# Patient Record
Sex: Male | Born: 1960 | Race: White | Hispanic: Yes | Marital: Married | Smoking: Former smoker
Health system: Southern US, Community
[De-identification: ages and names within clinical notes are randomized; demographics above are authoritative.]

## PROBLEM LIST (undated history)

## (undated) DIAGNOSIS — I2510 Atherosclerotic heart disease of native coronary artery without angina pectoris: Secondary | ICD-10-CM

## (undated) DIAGNOSIS — E119 Type 2 diabetes mellitus without complications: Secondary | ICD-10-CM

## (undated) DIAGNOSIS — E785 Hyperlipidemia, unspecified: Secondary | ICD-10-CM

## (undated) DIAGNOSIS — I10 Essential (primary) hypertension: Secondary | ICD-10-CM

## (undated) HISTORY — DX: Atherosclerotic heart disease of native coronary artery without angina pectoris: I25.10

---

## 2005-10-18 ENCOUNTER — Emergency Department (HOSPITAL_COMMUNITY): Admission: EM | Admit: 2005-10-18 | Discharge: 2005-10-18 | Payer: Self-pay | Admitting: Family Medicine

## 2007-07-16 ENCOUNTER — Ambulatory Visit: Payer: Self-pay | Admitting: Nurse Practitioner

## 2007-07-17 ENCOUNTER — Ambulatory Visit: Payer: Self-pay | Admitting: *Deleted

## 2008-04-10 ENCOUNTER — Ambulatory Visit: Payer: Self-pay | Admitting: Internal Medicine

## 2008-04-23 ENCOUNTER — Ambulatory Visit: Payer: Self-pay | Admitting: Internal Medicine

## 2008-05-22 ENCOUNTER — Ambulatory Visit: Payer: Self-pay | Admitting: Internal Medicine

## 2008-06-09 ENCOUNTER — Encounter (INDEPENDENT_AMBULATORY_CARE_PROVIDER_SITE_OTHER): Payer: Self-pay | Admitting: Adult Health

## 2008-06-09 ENCOUNTER — Ambulatory Visit: Payer: Self-pay | Admitting: Internal Medicine

## 2008-06-09 LAB — CONVERTED CEMR LAB
BUN: 16 mg/dL (ref 6–23)
Chloride: 104 meq/L (ref 96–112)
LDL Cholesterol: 118 mg/dL — ABNORMAL HIGH (ref 0–99)
Total CHOL/HDL Ratio: 6.6
Triglycerides: 215 mg/dL — ABNORMAL HIGH (ref <150)

## 2008-07-05 ENCOUNTER — Inpatient Hospital Stay (HOSPITAL_COMMUNITY): Admission: EM | Admit: 2008-07-05 | Discharge: 2008-07-09 | Payer: Self-pay | Admitting: Emergency Medicine

## 2008-08-07 ENCOUNTER — Ambulatory Visit: Payer: Self-pay | Admitting: Internal Medicine

## 2009-03-08 ENCOUNTER — Encounter: Admission: RE | Admit: 2009-03-08 | Discharge: 2009-03-08 | Payer: Self-pay | Admitting: Specialist

## 2009-11-01 ENCOUNTER — Emergency Department (HOSPITAL_COMMUNITY): Admission: EM | Admit: 2009-11-01 | Discharge: 2009-11-02 | Payer: Self-pay | Admitting: Emergency Medicine

## 2010-06-17 LAB — URINE CULTURE
Colony Count: NO GROWTH
Culture  Setup Time: 201108020438
Culture: NO GROWTH

## 2010-06-17 LAB — DIFFERENTIAL
Basophils Absolute: 0 10*3/uL (ref 0.0–0.1)
Basophils Relative: 1 % (ref 0–1)
Lymphocytes Relative: 20 % (ref 12–46)
Lymphs Abs: 0.7 10*3/uL (ref 0.7–4.0)
Monocytes Absolute: 0.3 10*3/uL (ref 0.1–1.0)
Monocytes Relative: 8 % (ref 3–12)

## 2010-06-17 LAB — CULTURE, BLOOD (ROUTINE X 2): Culture: NO GROWTH

## 2010-06-17 LAB — BASIC METABOLIC PANEL
BUN: 7 mg/dL (ref 6–23)
CO2: 25 mEq/L (ref 19–32)
Calcium: 7.9 mg/dL — ABNORMAL LOW (ref 8.4–10.5)
Glucose, Bld: 146 mg/dL — ABNORMAL HIGH (ref 70–99)
Sodium: 135 mEq/L (ref 135–145)

## 2010-06-17 LAB — URINALYSIS, ROUTINE W REFLEX MICROSCOPIC
Glucose, UA: NEGATIVE mg/dL
Ketones, ur: NEGATIVE mg/dL
Nitrite: NEGATIVE
Protein, ur: NEGATIVE mg/dL
Specific Gravity, Urine: 1.004 — ABNORMAL LOW (ref 1.005–1.030)

## 2010-06-17 LAB — CBC
Hemoglobin: 13.9 g/dL (ref 13.0–17.0)
MCH: 32 pg (ref 26.0–34.0)
MCV: 89 fL (ref 78.0–100.0)
WBC: 3.6 10*3/uL — ABNORMAL LOW (ref 4.0–10.5)

## 2010-07-13 LAB — CBC
HCT: 39.6 % (ref 39.0–52.0)
HCT: 43.2 % (ref 39.0–52.0)
Hemoglobin: 13.1 g/dL (ref 13.0–17.0)
Hemoglobin: 14.1 g/dL (ref 13.0–17.0)
Hemoglobin: 15.1 g/dL (ref 13.0–17.0)
MCHC: 34.8 g/dL (ref 30.0–36.0)
MCHC: 35.6 g/dL (ref 30.0–36.0)
MCV: 94.6 fL (ref 78.0–100.0)
MCV: 95 fL (ref 78.0–100.0)
Platelets: 122 10*3/uL — ABNORMAL LOW (ref 150–400)
Platelets: 99 10*3/uL — ABNORMAL LOW (ref 150–400)
RBC: 4.59 MIL/uL (ref 4.22–5.81)
RDW: 12.8 % (ref 11.5–15.5)
RDW: 12.9 % (ref 11.5–15.5)
RDW: 12.9 % (ref 11.5–15.5)
RDW: 12.9 % (ref 11.5–15.5)
WBC: 9.1 10*3/uL (ref 4.0–10.5)

## 2010-07-13 LAB — HEMOGLOBIN A1C
Hgb A1c MFr Bld: 6.4 % — ABNORMAL HIGH (ref 4.6–6.1)
Hgb A1c MFr Bld: 6.5 % — ABNORMAL HIGH (ref 4.6–6.1)
Mean Plasma Glucose: 140 mg/dL

## 2010-07-13 LAB — MAGNESIUM
Magnesium: 2 mg/dL (ref 1.5–2.5)
Magnesium: 2.2 mg/dL (ref 1.5–2.5)
Magnesium: 2.3 mg/dL (ref 1.5–2.5)

## 2010-07-13 LAB — PHOSPHORUS
Phosphorus: 1.1 mg/dL — ABNORMAL LOW (ref 2.3–4.6)
Phosphorus: 2.4 mg/dL (ref 2.3–4.6)
Phosphorus: 3 mg/dL (ref 2.3–4.6)

## 2010-07-13 LAB — BASIC METABOLIC PANEL
BUN: 7 mg/dL (ref 6–23)
CO2: 21 mEq/L (ref 19–32)
CO2: 23 mEq/L (ref 19–32)
Calcium: 8.5 mg/dL (ref 8.4–10.5)
Calcium: 8.7 mg/dL (ref 8.4–10.5)
Calcium: 8.8 mg/dL (ref 8.4–10.5)
Chloride: 101 mEq/L (ref 96–112)
Creatinine, Ser: 0.99 mg/dL (ref 0.4–1.5)
Creatinine, Ser: 1.11 mg/dL (ref 0.4–1.5)
GFR calc Af Amer: >60 mL/min (ref >60)
GFR calc non Af Amer: >60 mL/min (ref >60)
GFR calc non Af Amer: >60 mL/min (ref >60)
Glucose, Bld: 119 mg/dL — ABNORMAL HIGH (ref 70–99)
Glucose, Bld: 126 mg/dL — ABNORMAL HIGH (ref 70–99)
Potassium: 3.2 mEq/L — ABNORMAL LOW (ref 3.5–5.1)
Sodium: 135 mEq/L (ref 135–145)
Sodium: 136 mEq/L (ref 135–145)

## 2010-07-13 LAB — DIFFERENTIAL
Basophils Absolute: 0 10*3/uL (ref 0.0–0.1)
Lymphs Abs: 1 10*3/uL (ref 0.7–4.0)
Neutrophils Relative %: 81 % — ABNORMAL HIGH (ref 43–77)

## 2010-07-13 LAB — URINE CULTURE

## 2010-07-13 LAB — GLUCOSE, CAPILLARY
Glucose-Capillary: 109 mg/dL — ABNORMAL HIGH (ref 70–99)
Glucose-Capillary: 110 mg/dL — ABNORMAL HIGH (ref 70–99)
Glucose-Capillary: 123 mg/dL — ABNORMAL HIGH (ref 70–99)
Glucose-Capillary: 125 mg/dL — ABNORMAL HIGH (ref 70–99)
Glucose-Capillary: 131 mg/dL — ABNORMAL HIGH (ref 70–99)
Glucose-Capillary: 132 mg/dL — ABNORMAL HIGH (ref 70–99)
Glucose-Capillary: 152 mg/dL — ABNORMAL HIGH (ref 70–99)

## 2010-07-13 LAB — URINALYSIS, ROUTINE W REFLEX MICROSCOPIC
Bilirubin Urine: NEGATIVE
Ketones, ur: NEGATIVE mg/dL
Nitrite: NEGATIVE
Protein, ur: NEGATIVE mg/dL

## 2010-07-13 LAB — POCT CARDIAC MARKERS: Myoglobin, poc: 58.3 ng/mL (ref 12–200)

## 2010-07-13 LAB — CK TOTAL AND CKMB (NOT AT ARMC)
CK, MB: 0.2 ng/mL — ABNORMAL LOW (ref 0.3–4.0)
Relative Index: INVALID (ref 0.0–2.5)

## 2010-07-13 LAB — COMPREHENSIVE METABOLIC PANEL
Albumin: 4 g/dL (ref 3.5–5.2)
BUN: 7 mg/dL (ref 6–23)
CO2: 27 mEq/L (ref 19–32)
Calcium: 9.1 mg/dL (ref 8.4–10.5)
Creatinine, Ser: 1.14 mg/dL (ref 0.4–1.5)
GFR calc non Af Amer: >60 mL/min (ref >60)
Glucose, Bld: 168 mg/dL — ABNORMAL HIGH (ref 70–99)
Potassium: 2.5 mEq/L — CL (ref 3.5–5.1)
Total Protein: 7.4 g/dL (ref 6.0–8.3)

## 2010-07-13 LAB — CULTURE, BLOOD (ROUTINE X 2)
Culture: NO GROWTH
Culture: NO GROWTH

## 2010-07-13 LAB — TROPONIN I: Troponin I: <0.01 ng/mL (ref 0.00–0.06)

## 2010-08-16 NOTE — Discharge Summary (Signed)
NAMEKYLEE, [name]              ACCOUNT NO.:  [id number]   MEDICAL RECORD NO.:  [id number]          PATIENT TYPE:  INP   LOCATION:  5511                         FACILITY:  [facility]   PHYSICIAN:  Altha Harm, MDDATE OF BIRTH:  January 21, 1961   DATE OF ADMISSION:  07/05/2008  DATE OF DISCHARGE:  07/09/2008                               DISCHARGE SUMMARY   DISCHARGE DISPOSITION:  Home.   FINAL DISCHARGE DIAGNOSES:  1. Viral gastroenteritis.  2. Dehydration, resolved.  3. Nausea and vomiting.  4. Hypokalemia.  5. Chest pain, resolved.  6. Subacute sinusitis.  7. Hyperglycemia.  8. Tachycardia, resolved.  9. Local reaction to pneumonia vaccine.   DISCHARGE MEDICATIONS:  Avelox 400 mg p.o. daily times three days.   MEDICATION BEING HELD AT THIS TIME:  Hydrochlorothiazide 25 mg p.o.  daily.   ALLERGIES:  NO KNOWN DRUG ALLERGIES.   CODE STATUS:  Full code.   CHIEF COMPLAINT:  Nausea, vomiting, and fever.   HISTORY OF PRESENT ILLNESS:  Please refer to the H and P by Maude Leriche, MD for details of the HPI.   HOSPITAL COURSE:  1. The patient was admitted with nausea, vomiting and fever.  There      was some concern that this might have been a reaction to food he      had eaten.  However, it appeared to be more consistent with a viral      gastroenteritis.  The patient was also significantly dehydrated at      the time of admission and his hydrochlorothiazide was held.  The      patient was given supportive care and IV fluid resuscitation.  He      resolved his nausea and vomiting.  He was started on a diet, which      he advanced to a 2-gram sodium diet and tolerated it without any      difficulty.  The patient was still clinically dehydrated with      tachycardia resulting from his dehydration.  The patient was given      aggressive fluid hydration and resolved his tachycardia.      Currently, his hydrochlorothiazide is being held secondary to a      normotensive state  without any antihypertensives and secondary to      the fact that he was dehydrated upon admission.  The patient has      been instructed to monitor his blood pressure and to resume his      hydrochlorothiazide if his blood pressure gets above 140/90.      Currently, blood pressures are ranging within the 122/79 range.  2. Tachycardia.  It was felt that the tachycardia was secondary to      volume depletion.  This resolved after the patient was fluid      resuscitated.  3. Local reaction to pneumonia vaccination.  The patient had a      pneumonia vaccination and developed a local reaction with erythema      and redness to the skin.  This did not extend beyond the area of  the vaccination and it was felt that this was local and not a      systemic infection.  4. Subacute sinusitis.  The patient complained of a headache,      particularly over the sinuses.  The patient had already been      started on Avelox and a CT scan of the sinuses did not show any      evidence of acute sinusitis.  However, in light of the fact that      the patient had partial treatment, the patient will complete Avelox      for a total of 3 days for a total of 7 days of treatment.   Otherwise, the patient remained stable in the hospital.  He is now  ambulatory without any need for oxygen.  The patient has no complaints  of headache, difficulty with vision and his fevers have resolved.   RADIOLOGIC STUDIES:  Done here in the hospital include the following:  1. A 2-view chest x-ray which showed bronchial thickening and no      consolidation or collapse.  2. A  2-view of the abdomen which showed unremarkable abdominal      radiographs.  3. CT of the head the head without contrast which showed normal head      CT.  4. Maxillofacial CT of the sinuses which showed mucosal inflammation      of the right maxillary sinus.  The other sinuses clear.   DIETARY RESTRICTIONS:  The patient is on a 2-gram sodium diet.    PHYSICAL RESTRICTIONS:  None.   FOLLOW UP:  The patient is to follow up with [facility], his primary  care physicians within 1 week.   OTHER INSTRUCTIONS:  The patient is to monitor his blood pressure and  reinstitute taking his hydrochlorothiazide if his blood pressure gets  over 140/90.      Altha Harm, MD  Electronically Signed     MAM/MEDQ  D:  07/09/2008  T:  07/09/2008  Job:  [phone]

## 2010-08-16 NOTE — H&P (Signed)
NAMEOSHER, OETTINGER              ACCOUNT NO.:  [id number]   MEDICAL RECORD NO.:  [id number]          PATIENT TYPE:  INP   LOCATION:  5511                         FACILITY:  [facility]   PHYSICIAN:  Lamar Laundry, MD      DATE OF BIRTH:  08-20-1960   DATE OF ADMISSION:  07/05/2008  DATE OF DISCHARGE:                              HISTORY & PHYSICAL   CHIEF COMPLAINT:  Nausea, vomiting and fever.   HISTORY OF PRESENT ILLNESS:  This is a 50 year old Hispanic gentleman  with a previous history of hypertension who presents to the emergency  department after experiencing nausea, vomiting, and a fever earlier  today.  He denies any sick contacts.  He states that he had pollo asada,  a grilled chicken dish last night and he started to feel queasy after  eating this.  His fever did not occur until today.  He states that he  vomited twice today and this vomitus was nonbloody and nonbilious and  simply resembled the food that he had eaten the night before.  He states  that since he vomited earlier today he has had a mild pain in his  central chest and has felt mildly short of breath.  He denies any cough.  He states that the abdominal discomfort he had last night has actually  improved.  He denies that he ever has any exertional chest pain or  shortness of breath.  He furthermore denies other complaints such as  dysuria, diarrhea, melena, hematochezia or new skin findings.  In the  ER, his labs returned revealing a potassium of 2.5.  He was started on  potassium replacement with 50 mEq being provided between oral and IV  methods and 650 mg of Tylenol was administered for his fever.   ALLERGIES:  No known drug allergies.   MEDICATIONS:  Hydrochlorothiazide 25 mg p.o. daily.   PAST MEDICAL HISTORY:  Significant only for hypertension.   SOCIAL HISTORY:  He lives in [place].  He is married.  He has several  children.  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  He has a 15-pack year  smoking  history and quit 10 years ago.  He uses alcohol occasionally.  He denies the use of any illicit drugs.   FAMILY HISTORY:  His mother had coronary artery disease and is status  post 3-vessel CABG done at some point in her 60s.   REVIEW OF SYSTEMS:  A 10-point review of systems was performed and  significant only for the nausea and vomiting, fever and mild chest  discomfort since his episode of emesis as previously described in the  HPI.   PHYSICAL EXAMINATION:  VITAL SIGNS: Temperature 101.9, blood pressure  137/89, pulse 103 which has decreased down to 90, respirations 20, and  97% on room air.  GENERAL:  This is a well-appearing Latin American gentleman in no acute  distress awake, alert, oriented x3.  HEENT EXAM:  Normocephalic, atraumatic.  Extraocular movements intact.  Dry mucous membranes.  NECK: No jugular venous distention.  CARDIOVASCULAR:  Regular rate and rhythm.  S1, S2; no murmurs.  LUNGS: Clear  to auscultation bilaterally.  No crackles, wheezes or  rhonchi.  ABDOMEN: Soft.  There is some very minimal tenderness in the left upper  quadrant.  Normoactive bowel sounds.  EXTREMITIES: No pretibial edema.  SKIN: No rashes.   LABORATORY DATA:  WBC count 9.6 with 81% neutrophils, hemoglobin 15.1,  hematocrit 43.2, platelets 130.  Sodium 134, potassium 2.5, chloride 96,  bicarb 27, BUN 7, creatinine 1.14, glucose 168.  LFTs within normal  limits.  Urinalysis is negative.  Albumin 4, calcium 9.1.  Group A strep  was negative.  Chest x-ray revealed some bronchial thickening but no  evidence of consolidation.  An EKG revealed normal sinus rhythm without  any acute changes.   ASSESSMENT AND PLAN:  This is a 50 year old gentleman with nausea,  vomiting and fever, likely from gastroenteritis or even possible food  poisoning, and subsequent hypokalemia.  1. Hypokalemia.  The patient has already received repletion of his      potassium via IV and p.o. strategies.  We will recheck  his      potassium and administer potassium in his IV fluids with normal      saline with 40 mEq of KCl at 100 ml/hr.  Additionally we will check      his magnesium and replete if needed.  His potassium may need to be      more aggressively repleted if it is still significantly low at the      subsequent check.  2. Nausea, vomiting and fever.  As previously stated, it is likely      this patient may have a gastroenteritis or may have a possible food      poisoning.  However, due to the mild tenderness in the abdomen, we      will check a abdominal x-ray to rule out any acute findings.  We      will place this patient on a PPI.  We will place him on a clear      liquid, diet provide IV fluids and provide p.r.n. antiemetics as      needed.  3. Chest pain.  This patient has some mild chest discomfort in his      central chest that has occurred since his episodes of emesis.  He      does not have any exertional component to this.  However, as he is      a 50 year old gentleman with hypertension and a prior smoking      history with the family history of coronary disease, we will check      one set of cardiac enzymes.  If these are negative, as his chest      pain has been around for greater than 6 hours and considering his      story is unlikely for a cardiac event, it would be most likely that      his chest pain was secondary to his emesis and other GI issues.  We      will place the patient on a proton pump inhibitor.  4. Elevated blood sugar.  His blood sugar returned at 168. This may      have been secondary to some acute stress.  However, as this patient      does not regularly see PCP, we will place the patient on every 6-      hour Accu-Cheks and check hemoglobin A1c to see if he may have a      diagnosis of diabetes.  5. Prophylaxis.  We will place this patient on Lovenox every 24 hours.      This patient has been placed on a PPI as well due to his chest      discomfort and  abdominal discomfort.  6. Code status.  The patient wishes to be a full code.      Lamar Laundry, MD  Electronically Signed     HR/MEDQ  D:  07/05/2008  T:  07/06/2008  Job:  [id number]

## 2014-11-01 ENCOUNTER — Emergency Department (HOSPITAL_COMMUNITY)
Admission: EM | Admit: 2014-11-01 | Discharge: 2014-11-01 | Disposition: A | Discharge status: Home / Self Care | Payer: No Typology Code available for payment source | Attending: Emergency Medicine | Admitting: Emergency Medicine

## 2014-11-01 ENCOUNTER — Emergency Department (HOSPITAL_COMMUNITY): Payer: No Typology Code available for payment source

## 2014-11-01 ENCOUNTER — Encounter (HOSPITAL_COMMUNITY): Payer: Self-pay | Admitting: Emergency Medicine

## 2014-11-01 DIAGNOSIS — B349 Viral infection, unspecified: Secondary | ICD-10-CM | POA: Diagnosis not present

## 2014-11-01 DIAGNOSIS — J029 Acute pharyngitis, unspecified: Secondary | ICD-10-CM | POA: Diagnosis present

## 2014-11-01 DIAGNOSIS — E119 Type 2 diabetes mellitus without complications: Secondary | ICD-10-CM | POA: Insufficient documentation

## 2014-11-01 DIAGNOSIS — I10 Essential (primary) hypertension: Secondary | ICD-10-CM | POA: Insufficient documentation

## 2014-11-01 HISTORY — DX: Type 2 diabetes mellitus without complications: E11.9

## 2014-11-01 HISTORY — DX: Essential (primary) hypertension: I10

## 2014-11-01 LAB — RAPID STREP SCREEN (MED CTR MEBANE ONLY): STREPTOCOCCUS, GROUP A SCREEN (DIRECT): NEGATIVE

## 2014-11-01 MED ORDER — LIDOCAINE VISCOUS 2 % MT SOLN: 20.0000 mL | OROMUCOSAL | Status: DC | PRN

## 2014-11-01 MED ORDER — ACETAMINOPHEN 325 MG PO TABS
650.0000 mg | ORAL_TABLET | Freq: Once | ORAL | Status: AC
  Administered 2014-11-01: 650 mg via ORAL
  Filled 2014-11-01: qty 2

## 2014-11-01 MED ORDER — DEXAMETHASONE 1 MG/ML PO CONC
10.0000 mg | Freq: Once | ORAL | Status: AC
  Administered 2014-11-01: 10 mg via ORAL
  Filled 2014-11-01: qty 10

## 2014-11-01 MED ORDER — IBUPROFEN 600 MG PO TABS: 600.0000 mg | ORAL_TABLET | Freq: Four times a day (QID) | ORAL | Status: DC | PRN

## 2014-11-01 MED ORDER — HYDROCODONE-HOMATROPINE 5-1.5 MG/5ML PO SYRP
5.0000 mL | ORAL_SOLUTION | Freq: Once | ORAL | Status: AC
  Administered 2014-11-01: 5 mL via ORAL
  Filled 2014-11-01: qty 5

## 2014-11-01 MED ORDER — ACETAMINOPHEN 500 MG PO TABS: 500.0000 mg | ORAL_TABLET | Freq: Four times a day (QID) | ORAL | Status: DC | PRN

## 2014-11-01 NOTE — Discharge Instructions (Signed)
Please follow up with your primary care physician in 1-2 days. If you do not have one please call the [facility] and wellness Center number listed above.Your strep screen was negative this evening. A throat culture was sent as a precaution and results will be available in 2-3 days. If it returns positive for strep, you will be called by our flow manager for further instructions. However, at this time, it appears that your sore throat is caused by a viral infection. Antibiotics do NOT help a viral infection and can cause unwanted side effects. The fever should resolve in 2-3 days and sore throat should begin to resolve in 2-3 days as well. May take ibuprofen every 6hr as needed for throat pain and fever. Follow up with your doctor in 2-3 days. Return sooner for worsening symptoms, inability to swallow, breathing difficulty, new concerns. Please read all discharge instructions and return precautions.   Infecciones virales  (Viral Infections)  Un virus es un tipo de germen. Puede causar:   Dolor de garganta leve.  Dolores musculares.  Dolor de [place].  Secrecin nasal.  Erupciones.  Lagrimeo.  Cansancio.  Tos.  Prdida del apetito.  Ganas de vomitar (nuseas).  Vmitos.  Materia fecal lquida (diarrea). CUIDADOS EN EL HOGAR   Tome la medicacin slo como le haya indicado el mdico.  Beba gran cantidad de lquido para mantener la orina de tono claro o color amarillo plido. Las bebidas deportivas son [employer] eleccin.  Descanse lo suficiente y [employer]. Puede tomar sopas y caldos con crackers o arroz. SOLICITE AYUDA DE INMEDIATO SI:   Siente un dolor de cabeza muy intenso.  Le falta el aire.  Tiene dolor en el pecho o en el cuello.  Tiene una erupcin que no tena antes.  No puede detener los vmitos.  Tiene una hemorragia que no se detiene.  No puede retener los lquidos.  Usted o el nio tienen una temperatura oral le sube a ms de 38,9 C (102 F), y no  puede bajarla con medicamentos.  Su beb tiene ms de 3 meses y su temperatura rectal es de 102 F (38.9 C) o ms.  Su beb tiene 3 meses o menos y su temperatura rectal es de 100.4 F (38 C) o ms. ASEGRESE DE QUE:   Comprende estas instrucciones.  Controlar la enfermedad.  Solicitar ayuda de inmediato si no mejora o si empeora. Document Released: 08/22/2010 Document Revised: 06/12/2011 [facility] Patient Information 2015 [place], [place]. This information is not intended to replace advice given to you by your health care provider. Make sure you discuss any questions you have with your health care provider.

## 2014-11-01 NOTE — ED Notes (Signed)
Water given to pt per Will, PA

## 2014-11-01 NOTE — ED Provider Notes (Signed)
CSN: [id number]     Arrival date & time 11/01/14  2103 History   First MD Initiated Contact with Patient 11/01/14 2144     Chief Complaint  Patient presents with  . Sore Throat  . Fever     (Consider location/radiation/quality/duration/timing/severity/associated sxs/prior Treatment) HPI Comments: Pt. reports sore throat with swelling , fever , occasional dry cough and chills onset 3 days ago .   Patient is a 54 y.o. male presenting with pharyngitis. The history is provided by the patient. The history is limited by a language barrier.  Sore Throat This is a new problem. The current episode started in the past 7 days. The problem occurs constantly. The problem has been gradually worsening. Associated symptoms include congestion, coughing, a fever and a sore throat. Pertinent negatives include no rash. The symptoms are aggravated by swallowing. He has tried NSAIDs for the symptoms. The treatment provided mild relief.    Past Medical History  Diagnosis Date  . Diabetes mellitus without complication   . Hypertension    History reviewed. No pertinent past surgical history. No family history on file. History  Substance Use Topics  . Smoking status: Never Smoker   . Smokeless tobacco: Not on file  . Alcohol Use: Yes    Review of Systems  Constitutional: Positive for fever.  HENT: Positive for congestion and sore throat.   Respiratory: Positive for cough.   Skin: Negative for rash.  All other systems reviewed and are negative.     Allergies  Review of patient's allergies indicates no known allergies.  Home Medications   Prior to Admission medications   Medication Sig Start Date End Date Taking? Authorizing Provider  acetaminophen (TYLENOL) 500 MG tablet Take 1 tablet (500 mg total) by mouth every 6 (six) hours as needed. 11/01/14   Tanielle Emigh, PA-C  ibuprofen (ADVIL,MOTRIN) 600 MG tablet Take 1 tablet (600 mg total) by mouth every 6 (six) hours as needed. 11/01/14    Brinlynn Gorton, PA-C  lidocaine (XYLOCAINE) 2 % solution Use as directed 20 mLs in the mouth or throat as needed for mouth pain. 11/01/14   Zera Markwardt, PA-C   BP 97/54 mmHg  Pulse 78  Temp(Src) 98.6 F (37 C) (Oral)  Resp 16  SpO2 98% Physical Exam  Constitutional: He is oriented to person, place, and time. He appears well-developed and well-nourished. No distress.  HENT:  Head: Normocephalic and atraumatic.  Right Ear: External ear normal.  Left Ear: External ear normal.  Nose: Nose normal.  Mouth/Throat: Oropharynx is clear and moist. No oropharyngeal exudate.  Eyes: Conjunctivae are normal.  Neck: Normal range of motion. Neck supple.  No nuchal rigidity. Erythematous oropharynx without exudate. No uvular deviation or trismus.  Cardiovascular: Normal rate, regular rhythm and normal heart sounds.   Pulmonary/Chest: Effort normal and breath sounds normal. No respiratory distress.  Abdominal: Soft.  Musculoskeletal: Normal range of motion.  Lymphadenopathy:    He has cervical adenopathy.  Neurological: He is alert and oriented to person, place, and time.  Skin: Skin is warm and dry. He is not diaphoretic.  Psychiatric: He has a normal mood and affect.  Nursing note and vitals reviewed.   ED Course  Procedures (including critical care time) Medications  acetaminophen (TYLENOL) tablet 650 mg (650 mg Oral Given 11/01/14 2155)  dexamethasone (DECADRON) 1 MG/ML solution 10 mg (10 mg Oral Given 11/01/14 2224)  HYDROcodone-homatropine (HYCODAN) 5-1.5 MG/5ML syrup 5 mL (5 mLs Oral Given 11/01/14 2224)    Labs  Review Labs Reviewed  RAPID STREP SCREEN (NOT AT [facility])  CULTURE, GROUP A STREP    Imaging Review Dg Chest 2 View  11/01/2014   CLINICAL DATA:  Fever and cough for 3 days  EXAM: CHEST  2 VIEW  COMPARISON:  11/02/2009  FINDINGS: The heart size and mediastinal contours are within normal limits. Both lungs are clear. The visualized skeletal structures are  unremarkable.  IMPRESSION: No active cardiopulmonary disease.   Electronically Signed   By: Ellery Plunk M.D.   On: 11/01/2014 23:16     EKG Interpretation None      MDM   Final diagnoses:  Viral illness    Filed Vitals:   11/01/14 2327  BP: 97/54  Pulse: 78  Temp: 98.6 F (37 C)  Resp: 16   Patient presenting with fever to ED. Pt alert, active, and oriented per age. PE showederythematous oropharynx without medial deviation or trismus. Mild cervical adenopathy appreciated. Lungs clear to auscultation bilaterally. Abdomen is soft, nontender, nondistended. nuchal rigidity or toxicity to suggest meningitis. Pt tolerating PO liquids in ED without difficulty.  Tylenold improvement of fetachycardia resolved with fever reduction. Rapid strep negative for bacterial source. Chest x-ray without evidence of pneumonia. Likely viral infection. Symptomatically measures discussed.Advised pediatrician follow up in 1-2 days. Return precautions discussed. Patient agreeable to plan. Stable at time of discharge.     Francee Piccolo, PA-C 11/02/14 0044  Blake Divine, MD 11/03/14 [phone]

## 2014-11-01 NOTE — ED Notes (Signed)
Pt. reports sore throat with swelling , fever , occasional dry cough and chills onset 3 days ago .

## 2014-11-05 LAB — CULTURE, GROUP A STREP

## 2017-07-21 ENCOUNTER — Emergency Department (HOSPITAL_COMMUNITY): Payer: BLUE CROSS/BLUE SHIELD

## 2017-07-21 ENCOUNTER — Inpatient Hospital Stay (HOSPITAL_COMMUNITY)
Admission: EM | Admit: 2017-07-21 | Discharge: 2017-07-30 | DRG: 233 | Disposition: A | Discharge status: Home / Self Care | Payer: BLUE CROSS/BLUE SHIELD | Attending: Cardiothoracic Surgery | Admitting: Cardiothoracic Surgery

## 2017-07-21 ENCOUNTER — Encounter (HOSPITAL_COMMUNITY): Payer: Self-pay | Admitting: Emergency Medicine

## 2017-07-21 DIAGNOSIS — I214 Non-ST elevation (NSTEMI) myocardial infarction: Secondary | ICD-10-CM | POA: Diagnosis present

## 2017-07-21 DIAGNOSIS — E785 Hyperlipidemia, unspecified: Secondary | ICD-10-CM

## 2017-07-21 DIAGNOSIS — I10 Essential (primary) hypertension: Secondary | ICD-10-CM | POA: Insufficient documentation

## 2017-07-21 DIAGNOSIS — R079 Chest pain, unspecified: Secondary | ICD-10-CM | POA: Diagnosis not present

## 2017-07-21 DIAGNOSIS — Z09 Encounter for follow-up examination after completed treatment for conditions other than malignant neoplasm: Secondary | ICD-10-CM

## 2017-07-21 DIAGNOSIS — R946 Abnormal results of thyroid function studies: Secondary | ICD-10-CM | POA: Diagnosis present

## 2017-07-21 DIAGNOSIS — I4891 Unspecified atrial fibrillation: Secondary | ICD-10-CM | POA: Diagnosis not present

## 2017-07-21 DIAGNOSIS — Z87891 Personal history of nicotine dependence: Secondary | ICD-10-CM | POA: Diagnosis not present

## 2017-07-21 DIAGNOSIS — K5900 Constipation, unspecified: Secondary | ICD-10-CM | POA: Diagnosis present

## 2017-07-21 DIAGNOSIS — R001 Bradycardia, unspecified: Secondary | ICD-10-CM | POA: Diagnosis present

## 2017-07-21 DIAGNOSIS — E1100 Type 2 diabetes mellitus with hyperosmolarity without nonketotic hyperglycemic-hyperosmolar coma (NKHHC): Secondary | ICD-10-CM | POA: Insufficient documentation

## 2017-07-21 DIAGNOSIS — I25110 Atherosclerotic heart disease of native coronary artery with unstable angina pectoris: Secondary | ICD-10-CM | POA: Diagnosis present

## 2017-07-21 DIAGNOSIS — L239 Allergic contact dermatitis, unspecified cause: Secondary | ICD-10-CM | POA: Diagnosis not present

## 2017-07-21 DIAGNOSIS — Z8249 Family history of ischemic heart disease and other diseases of the circulatory system: Secondary | ICD-10-CM | POA: Diagnosis not present

## 2017-07-21 DIAGNOSIS — L231 Allergic contact dermatitis due to adhesives: Secondary | ICD-10-CM | POA: Diagnosis not present

## 2017-07-21 DIAGNOSIS — I252 Old myocardial infarction: Secondary | ICD-10-CM | POA: Diagnosis not present

## 2017-07-21 DIAGNOSIS — D696 Thrombocytopenia, unspecified: Secondary | ICD-10-CM | POA: Diagnosis present

## 2017-07-21 DIAGNOSIS — Z79899 Other long term (current) drug therapy: Secondary | ICD-10-CM | POA: Diagnosis not present

## 2017-07-21 DIAGNOSIS — Z951 Presence of aortocoronary bypass graft: Secondary | ICD-10-CM

## 2017-07-21 DIAGNOSIS — D62 Acute posthemorrhagic anemia: Secondary | ICD-10-CM | POA: Diagnosis not present

## 2017-07-21 DIAGNOSIS — Z7984 Long term (current) use of oral hypoglycemic drugs: Secondary | ICD-10-CM | POA: Diagnosis not present

## 2017-07-21 DIAGNOSIS — I480 Paroxysmal atrial fibrillation: Secondary | ICD-10-CM | POA: Diagnosis not present

## 2017-07-21 DIAGNOSIS — L259 Unspecified contact dermatitis, unspecified cause: Secondary | ICD-10-CM

## 2017-07-21 HISTORY — DX: Hyperlipidemia, unspecified: E78.5

## 2017-07-21 LAB — I-STAT TROPONIN, ED: Troponin i, poc: 5.83 ng/mL (ref 0.00–0.08)

## 2017-07-21 LAB — COMPREHENSIVE METABOLIC PANEL
ALT: 33 U/L (ref 17–63)
AST: 89 U/L — ABNORMAL HIGH (ref 15–41)
Albumin: 4.3 g/dL (ref 3.5–5.0)
Alkaline Phosphatase: 82 U/L (ref 38–126)
Anion gap: 13 (ref 5–15)
BUN: 11 mg/dL (ref 6–20)
CHLORIDE: 100 mmol/L — AB (ref 101–111)
CO2: 24 mmol/L (ref 22–32)
CREATININE: 1.1 mg/dL (ref 0.61–1.24)
Calcium: 9.4 mg/dL (ref 8.9–10.3)
Glucose, Bld: 128 mg/dL — ABNORMAL HIGH (ref 65–99)
Potassium: 3.8 mmol/L (ref 3.5–5.1)
SODIUM: 137 mmol/L (ref 135–145)
Total Bilirubin: 1.1 mg/dL (ref 0.3–1.2)
Total Protein: 7.6 g/dL (ref 6.5–8.1)

## 2017-07-21 LAB — CBC
HEMATOCRIT: 41.5 % (ref 39.0–52.0)
Hemoglobin: 14.1 g/dL (ref 13.0–17.0)
MCH: 31.8 pg (ref 26.0–34.0)
MCHC: 34 g/dL (ref 30.0–36.0)
MCV: 93.5 fL (ref 78.0–100.0)
PLATELETS: 187 10*3/uL (ref 150–400)
RBC: 4.44 MIL/uL (ref 4.22–5.81)
RDW: 12.9 % (ref 11.5–15.5)
WBC: 13.3 10*3/uL — AB (ref 4.0–10.5)

## 2017-07-21 LAB — PHOSPHORUS: Phosphorus: 3.6 mg/dL (ref 2.5–4.6)

## 2017-07-21 LAB — HEMOGLOBIN A1C
HEMOGLOBIN A1C: 6.7 % — AB (ref 4.8–5.6)
MEAN PLASMA GLUCOSE: 145.59 mg/dL

## 2017-07-21 LAB — MAGNESIUM: Magnesium: 1.6 mg/dL — ABNORMAL LOW (ref 1.7–2.4)

## 2017-07-21 MED ORDER — NITROGLYCERIN 0.4 MG SL SUBL: 0.4000 mg | SUBLINGUAL_TABLET | SUBLINGUAL | Status: DC | PRN

## 2017-07-21 MED ORDER — ACETAMINOPHEN 325 MG PO TABS: 650.0000 mg | ORAL_TABLET | ORAL | Status: DC | PRN

## 2017-07-21 MED ORDER — NITROGLYCERIN IN D5W 200-5 MCG/ML-% IV SOLN: 0.0000 ug/min | INTRAVENOUS | Status: DC

## 2017-07-21 MED ORDER — HEPARIN (PORCINE) IN NACL 100-0.45 UNIT/ML-% IJ SOLN
1300.0000 [IU]/h | INTRAMUSCULAR | Status: DC
  Administered 2017-07-21: 1300 [IU]/h via INTRAVENOUS
  Filled 2017-07-21: qty 250

## 2017-07-21 MED ORDER — SODIUM CHLORIDE 0.9 % IV BOLUS
500.0000 mL | Freq: Once | INTRAVENOUS | Status: AC
  Administered 2017-07-21: 500 mL via INTRAVENOUS

## 2017-07-21 MED ORDER — ASPIRIN EC 81 MG PO TBEC
81.0000 mg | DELAYED_RELEASE_TABLET | Freq: Every day | ORAL | Status: DC
  Administered 2017-07-22: 81 mg via ORAL
  Filled 2017-07-21: qty 1

## 2017-07-21 MED ORDER — ATORVASTATIN CALCIUM 80 MG PO TABS
80.0000 mg | ORAL_TABLET | Freq: Every day | ORAL | Status: DC
  Administered 2017-07-22: 80 mg via ORAL
  Filled 2017-07-21: qty 1

## 2017-07-21 MED ORDER — HEPARIN BOLUS VIA INFUSION
4000.0000 [IU] | Freq: Once | INTRAVENOUS | Status: AC
  Administered 2017-07-21: 4000 [IU] via INTRAVENOUS
  Filled 2017-07-21: qty 4000

## 2017-07-21 MED ORDER — ONDANSETRON HCL 4 MG/2ML IJ SOLN: 4.0000 mg | Freq: Four times a day (QID) | INTRAMUSCULAR | Status: DC | PRN

## 2017-07-21 MED ORDER — ASPIRIN 300 MG RE SUPP: 300.0000 mg | RECTAL | Status: DC

## 2017-07-21 MED ORDER — NITROGLYCERIN IN D5W 200-5 MCG/ML-% IV SOLN
0.0000 ug/min | Freq: Once | INTRAVENOUS | Status: AC
  Administered 2017-07-21: 5 ug/min via INTRAVENOUS
  Filled 2017-07-21: qty 250

## 2017-07-21 MED ORDER — ASPIRIN 81 MG PO CHEW: 324.0000 mg | CHEWABLE_TABLET | ORAL | Status: DC

## 2017-07-21 MED ORDER — NITROGLYCERIN 0.4 MG SL SUBL
0.4000 mg | SUBLINGUAL_TABLET | SUBLINGUAL | Status: DC | PRN
  Administered 2017-07-21: 0.4 mg via SUBLINGUAL
  Filled 2017-07-21: qty 1

## 2017-07-21 MED ORDER — ASPIRIN 81 MG PO CHEW
324.0000 mg | CHEWABLE_TABLET | Freq: Once | ORAL | Status: AC
  Administered 2017-07-21: 324 mg via ORAL
  Filled 2017-07-21: qty 4

## 2017-07-21 NOTE — ED Provider Notes (Signed)
[facility] Provider Note   CSN: [id number] Arrival date & time: 07/21/17  1758     History   Chief Complaint Chief Complaint  Patient presents with  . Chest Pain    HPI [name] is a 57 y.o. male.  HPI   Tthis is a 57 year old male presenting with chest pain.  Patient reports is been going on for approximately 3 days.  However it was intermittent for stay in the last day is been constant.  He reports heaviness in his chest that radiates to bilateral jaws.  It does not radiate to his hands.  Patient went to urgent care today.  Found to have EKG findings of 2 3 and aVF with flipped T's.  On arrival here patient additionally had flipped V4 through V6.  Patient does have chest pain, approximately 7 out of 10.  Has not taken anything today for  Past Medical History:  Diagnosis Date  . Diabetes mellitus without complication (HCC)   . Hyperlipidemia   . Hypertension     Patient Active Problem List   Diagnosis Date Noted  . NSTEMI (non-ST elevated myocardial infarction) (HCC)   . Essential hypertension   . Hyperlipidemia LDL goal <70   . Type 2 diabetes mellitus with hyperosmolarity without coma, without long-term current use of insulin (HCC)     History reviewed. No pertinent surgical history.      Home Medications    Prior to Admission medications   Medication Sig Start Date End Date Taking? Authorizing Provider  atorvastatin (LIPITOR) 40 MG tablet Take 40 mg by mouth at bedtime. 07/06/17  Yes [provider]  losartan-hydrochlorothiazide (HYZAAR) 100-25 MG tablet Take 1 tablet by mouth daily. 07/06/17  Yes [provider]  metFORMIN (GLUCOPHAGE) 1000 MG tablet Take 1,000 mg by mouth 2 (two) times daily. 07/06/17  Yes [provider]  acetaminophen (TYLENOL) 500 MG tablet Take 1 tablet (500 mg total) by mouth every 6 (six) hours as needed. Patient not taking: Reported on 07/21/2017 11/01/14   Piepenbrink,  Victorino DikeJennifer, PA-C  ibuprofen (ADVIL,MOTRIN) 600 MG tablet Take 1 tablet (600 mg total) by mouth every 6 (six) hours as needed. Patient not taking: Reported on 07/21/2017 11/01/14   Piepenbrink, Victorino DikeJennifer, PA-C  lidocaine (XYLOCAINE) 2 % solution Use as directed 20 mLs in the mouth or throat as needed for mouth pain. Patient not taking: Reported on 07/21/2017 11/01/14   Francee PiccoloPiepenbrink, Jennifer, PA-C    Family History Family History  Problem Relation Age of Onset  . Heart disease Mother     Social History Social History   Tobacco Use  . Smoking status: Former Smoker    Last attempt to quit: 07/22/1998    Years since quitting: 19.0  . Smokeless tobacco: Never Used  Substance Use Topics  . Alcohol use: Yes  . Drug use: No     Allergies   Patient has no known allergies.   Review of Systems Review of Systems  Constitutional: Negative for activity change.  Respiratory: Positive for chest tightness and shortness of breath.   Cardiovascular: Negative for chest pain.  Gastrointestinal: Negative for abdominal pain.  All other systems reviewed and are negative.    Physical Exam Updated Vital Signs BP 109/66   Pulse 71   Temp 97.7 F (36.5 C) (Oral)   Resp 17   Ht 5\' 6"  (1.676 m)   Wt 87.5 kg (193 lb)   SpO2 98%   BMI 31.15 kg/m  Physical Exam  Constitutional: He is oriented to person, place, and time. He appears well-nourished.  HENT:  Head: Normocephalic.  Eyes: Conjunctivae are normal.  r eye abnormal  Cardiovascular: Normal rate and normal pulses.  Pulmonary/Chest: Effort normal and breath sounds normal. No respiratory distress. He has no decreased breath sounds.  Abdominal: Soft. Bowel sounds are normal.  Neurological: He is oriented to person, place, and time.  Skin: Skin is warm and dry. He is not diaphoretic.  Psychiatric: He has a normal mood and affect. His behavior is normal.  Nursing note and vitals reviewed.    ED Treatments / Results  Labs (all labs  ordered are listed, but only abnormal results are displayed) Labs Reviewed  CBC - Abnormal; Notable for the following components:      Result Value   WBC 13.3 (*)    All other components within normal limits  COMPREHENSIVE METABOLIC PANEL - Abnormal; Notable for the following components:   Chloride 100 (*)    Glucose, Bld 128 (*)    AST 89 (*)    All other components within normal limits  MAGNESIUM - Abnormal; Notable for the following components:   Magnesium 1.6 (*)    All other components within normal limits  I-STAT TROPONIN, ED - Abnormal; Notable for the following components:   Troponin i, poc 5.83 (*)    All other components within normal limits  PHOSPHORUS  HEPARIN LEVEL (UNFRACTIONATED)  CBC  HIV ANTIBODY (ROUTINE TESTING)  TSH  TROPONIN I  TROPONIN I  TROPONIN I  HEMOGLOBIN A1C  OCCULT BLOOD X 1 CARD TO LAB, STOOL  BASIC METABOLIC PANEL  LIPID PANEL  CBC  PROTIME-INR    EKG EKG Interpretation  Date/Time:  Saturday July 21 2017 18:05:11 EDT Ventricular Rate:  69 PR Interval:  138 QRS Duration: 86 QT Interval:  410 QTC Calculation: 439 R Axis:   4 Text Interpretation:  Normal sinus rhythm Inferior infarct , age undetermined T wave abnormality, consider lateral ischemia Abnormal ECG T wave inversions in lateral leads and 2-avF Confirmed by Corlis Leak Surah Pelley ([id number]) on 07/21/2017 6:44:38 PM   Radiology Dg Chest Portable 1 View  Result Date: 07/21/2017 CLINICAL DATA:  Intermittent chest pain for 3 days, now constant. EXAM: PORTABLE CHEST 1 VIEW COMPARISON:  Chest radiograph November 01, 2014 FINDINGS: Cardiomediastinal silhouette is unremarkable for this low inspiratory examination with crowded vasculature markings. The lungs are clear without pleural effusions or focal consolidations. Persistently elevated RIGHT hemidiaphragm. Trachea projects midline and there is no pneumothorax. Included soft tissue planes and osseous structures are non-suspicious. IMPRESSION: No  acute cardiopulmonary process for this low inspiratory examination. Electronically Signed   By: Awilda Metro M.D.   On: 07/21/2017 19:12    Procedures Procedures (including critical care time)  CRITICAL CARE Performed by: Arlana Hove Total critical care time: 65 minutes Critical care time was exclusive of separately billable procedures and treating other patients. Critical care was necessary to treat or prevent imminent or life-threatening deterioration. Critical care was time spent personally by me on the following activities: development of treatment plan with patient and/or surrogate as well as nursing, discussions with consultants, evaluation of patient's response to treatment, examination of patient, obtaining history from patient or surrogate, ordering and performing treatments and interventions, ordering and review of laboratory studies, ordering and review of radiographic studies, pulse oximetry and re-evaluation of patient's condition.   Medications Ordered in ED Medications  nitroGLYCERIN (NITROSTAT) SL tablet 0.4 mg (0.4 mg Sublingual Given  07/21/17 1848)  heparin ADULT infusion 100 units/mL (25000 units/262mL sodium chloride 0.45%) (1,300 Units/hr Intravenous New Bag/Given 07/21/17 2000)  aspirin chewable tablet 324 mg (324 mg Oral Not Given 07/21/17 2233)    Or  aspirin suppository 300 mg ( Rectal See Alternative 07/21/17 2233)  aspirin EC tablet 81 mg (has no administration in time range)  nitroGLYCERIN (NITROSTAT) SL tablet 0.4 mg (has no administration in time range)  acetaminophen (TYLENOL) tablet 650 mg (has no administration in time range)  ondansetron (ZOFRAN) injection 4 mg (has no administration in time range)  nitroGLYCERIN 50 mg in dextrose 5 % 250 mL (0.2 mg/mL) infusion (20 mcg/min Intravenous Transfusing/Transfer 07/21/17 2306)  atorvastatin (LIPITOR) tablet 80 mg (has no administration in time range)  aspirin chewable tablet 324 mg (324 mg Oral Given  07/21/17 1848)  sodium chloride 0.9 % bolus 500 mL (0 mLs Intravenous Stopped 07/21/17 1944)  heparin bolus via infusion 4,000 Units (4,000 Units Intravenous Bolus from Bag 07/21/17 2000)  nitroGLYCERIN 50 mg in dextrose 5 % 250 mL (0.2 mg/mL) infusion (0 mcg/min Intravenous Stopped 07/21/17 2307)     Initial Impression / Assessment and Plan / ED Course  I have reviewed the triage vital signs and the nursing notes.  Pertinent labs & imaging results that were available during my care of the patient were reviewed by me and considered in my medical decision making (see chart for details).    This is a 57 year old male presenting with chest pain.  Patient reports is been going on for approximately 3 days.  However it was intermittent for stay in the last day is been constant.  He reports heaviness in his chest that radiates to bilateral jaws.  It does not radiate to his hands.  Patient went to urgent care today.  Found to have EKG findings of 2 3 and aVF with flipped T's.  On arrival here patient additionally had flipped V4 through V6.  Patient does have chest pain, approximately 7 out of 10.  Has not taken anything today for it.  6:53 PM Flipped T's made me be suspicious for ongoing ischemia.  Patient's i-STAT troponin came back at 5.3.  Will start nitro.  (CP relieved with nitro) Give aspirin.  Heparin started. Cardiology paged.  Discussed with Dr. Mayford Knife, will admit to cards to continue to monitor.   Final Clinical Impressions(s) / ED Diagnoses   Final diagnoses:  None    ED Discharge Orders    None       Abelino Derrick, MD 07/21/17 2311

## 2017-07-21 NOTE — ED Triage Notes (Signed)
Patient presents for 3 days of chest pain, diffuse in nature, burning in sensation, with very small amount of SOB, denies any other associated symptoms.

## 2017-07-21 NOTE — Progress Notes (Addendum)
ANTICOAGULATION CONSULT NOTE   Pharmacy Consult for Heparin Indication: chest pain/ACS  No Known Allergies  Patient Measurements: Height: 5\' 6"  (167.6 cm) Weight: 193 lb (87.5 kg) IBW/kg (Calculated) : 63.8  Vital Signs: Temp: 97.7 F (36.5 C) (04/20 1807) Temp Source: Oral (04/20 1807) BP: 112/76 (04/20 1845) Pulse Rate: 69 (04/20 1856)   Medical History: Past Medical History:  Diagnosis Date  . Diabetes mellitus without complication (HCC)   . Hypertension     Assessment: 57 year old male to begin heparin for chest pain  Goal of Therapy:  Heparin level 0.3-0.7 units/ml Monitor platelets by anticoagulation protocol: Yes   Plan:  Heparin 4000 units iv bolus x 1 Heparin drip at 1300 units / hr Heparin level 6 hours after heparin starts Daily heparin level, CBC  Thank you Okey RegalLisa Powell, PharmD [phone]  07/21/2017,7:02 PM   Post cath:  Restart heparin 8 hours after sheath pull.  Resume at 1150 units/hr.  Check heparin level 6 hours after restart Talbert CageLora Zaray Gatchel, PharmD

## 2017-07-21 NOTE — H&P (Addendum)
Admit date: 07/21/2017 Referring Physician: Dr. Benedict Needy Primary Cardiologist: none Chief complaint/reason for admission:Cpest pain and NSTEMI  HPI: [name] is a 57 y.o. male who is being seen today for the evaluation of chest pain with NSTEMI at the request of Dr. Corlis Leak in ER.    This is a 57yo Hispanic male with a history of HTN, hyperlipidemia and type 2 DM on Metformin who presented to the ER today with complaints of chest pain.  He is here with his wife and daughter who is translating for the patient.  His symptoms have been occurring intermittently for the past 3 days but today became constant.  He describes the pain as a heaviness that radiates into his jaw.  There is no associated nausea, SOB or diaphoresis.  He went to urgent care for evaluation where an EKG showed T wave inversions in the inferior leads and was sent to [facility] ER.  He currently complains of 2/10 CP after starting IV NTG gtt and IV Heparin.  Initial POC troponin was elevated at 5.83.  In ER he had a new T wave inversion in V6.  WBC is elevated at 13.3 and BS elevated at 128 otherwise labs were normal.  Cxray showed NAD.  Cardiology is now asked to admit patient for NSTEMI.     PMH:    Past Medical History:  Diagnosis Date  . Diabetes mellitus without complication (HCC)   . Hyperlipidemia   . Hypertension     PSH:   History reviewed. No pertinent surgical history.  ALLERGIES:   Patient has no known allergies.  Prior to Admit Meds:   (Not in a hospital admission) Family HX:    Family History  Problem Relation Age of Onset  . Heart disease Mother    Social HX:    Social History   Socioeconomic History  . Marital status: Married    Spouse name: Not on file  . Number of children: Not on file  . Years of education: Not on file  . Highest education level: Not on file  Occupational History  . Not on file  Social Needs  . Financial resource strain: Not on file  . Food insecurity:    Worry: Not on file    Inability: Not on file  . Transportation needs:    Medical: Not on file    Non-medical: Not on file  Tobacco Use  . Smoking status: Former Smoker    Last attempt to quit: 07/22/1998    Years since quitting: 19.0  . Smokeless tobacco: Never Used  Substance and Sexual Activity  . Alcohol use: Yes  . Drug use: No  . Sexual activity: Not on file  Lifestyle  . Physical activity:    Days per week: Not on file    Minutes per session: Not on file  . Stress: Not on file  Relationships  . Social connections:    Talks on phone: Not on file    Gets together: Not on file    Attends religious service: Not on file    Active member of club or organization: Not on file    Attends meetings of clubs or organizations: Not on file    Relationship status: Not on file  . Intimate partner violence:    Fear of current or ex partner: Not on file    Emotionally abused: Not on file    Physically abused: Not on file    Forced sexual activity: Not on file  Other Topics  Concern  . Not on file  Social History Narrative  . Not on file     ROS:  All ROS were addressed and are negative except what is stated in the HPI  PHYSICAL EXAM Vitals:   07/21/17 1856 07/21/17 1930  BP:  110/71  Pulse: 69 62  Resp: 18 (!) 21  Temp:    SpO2: 98% 100%   General: Well developed, well nourished, in no acute distress Head: Eyes PERRLA, No xanthomas.   Normal cephalic and atramatic  Lungs:   Clear bilaterally to auscultation and percussion. Heart:   HRRR S1 S2 Pulses are 2+ & equal.            No carotid bruit. No JVD.  No abdominal bruits. No femoral bruits. Abdomen: Bowel sounds are positive, abdomen soft and non-tender without masses or                  Hernia's noted. Msk:  Back normal, normal gait. Normal strength and tone for age. Extremities:   No clubbing, cyanosis or edema.  DP +1 Neuro: Alert and oriented X 3. Psych:  Good affect, responds appropriately   Labs:   Lab Results  Component Value  Date   WBC 13.3 (H) 07/21/2017   HGB 14.1 07/21/2017   HCT 41.5 07/21/2017   MCV 93.5 07/21/2017   PLT 187 07/21/2017    Recent Labs  Lab 07/21/17 1818  NA 137  K 3.8  CL 100*  CO2 24  BUN 11  CREATININE 1.10  CALCIUM 9.4  PROT 7.6  BILITOT 1.1  ALKPHOS 82  ALT 33  AST 89*  GLUCOSE 128*   Lab Results  Component Value Date   CKTOTAL 78 07/05/2008   CKMB 0.2 (L) 07/05/2008   TROPONINI <0.01        NO INDICATION OF MYOCARDIAL INJURY. 07/05/2008   No results found for: PTT No results found for: INR, PROTIME   Lab Results  Component Value Date   CHOL 190 06/09/2008   Lab Results  Component Value Date   HDL 29 (L) 06/09/2008   Lab Results  Component Value Date   LDLCALC 118 (H) 06/09/2008   Lab Results  Component Value Date   TRIG 215 (H) 06/09/2008   Lab Results  Component Value Date   CHOLHDL 6.6 Ratio 06/09/2008   No results found for: LDLDIRECT    Radiology:  Dg Chest Portable 1 View  Result Date: 07/21/2017 CLINICAL DATA:  Intermittent chest pain for 3 days, now constant. EXAM: PORTABLE CHEST 1 VIEW COMPARISON:  Chest radiograph November 01, 2014 FINDINGS: Cardiomediastinal silhouette is unremarkable for this low inspiratory examination with crowded vasculature markings. The lungs are clear without pleural effusions or focal consolidations. Persistently elevated RIGHT hemidiaphragm. Trachea projects midline and there is no pneumothorax. Included soft tissue planes and osseous structures are non-suspicious. IMPRESSION: No acute cardiopulmonary process for this low inspiratory examination. Electronically Signed   By: Awilda Metroourtnay  Bloomer M.D.   On: 07/21/2017 19:12     Telemetry    NSR .  - Personally Reviewed  ECG    NSR with T wave inversions in inferior leads and V6 - Personally Reviewed   ASSESSMENT/PLAN:   1.  NSTEMI - he has had stuttering chest pain for 3 days which became constant today . Currently complains of 7/10 Cp but improved with SL NTG.   Initial POC trop 5.83 with inferior T wave changes c/w ischemia.   -Admit to stepdown tele -IV  Heparin gtt per pharmacy -IV NTG gtt and titrate to keep pain free -cycle troponin until it peaks -ASA 81mg  daily -change to high dose statin - Lipitor 80mg  daily -Keep NPO for now in case we need to proceed with urgent cath if troponin continues to increase and he continues to have CP despite IV Heparin and NTG. -hold off on BB for now due to borderline bradycardia and soft BP -check FLP and ALT in am -will need left heart cath on Monday or sooner if CP persists and trop trends upward -2D echo in am to assess LVF  2.  Type 2 DM -check HbA1C -cover with SS regular insulin -hold Metformin  3.  HTN - BP controlled at present -hold losartan-HCT due to soft BP  4.  Hyperlipidemia -increase lipitor to 80mg  daily -check FLP and ALT in am  Armanda Magic, MD  07/21/2017  8:25 PM

## 2017-07-21 NOTE — Progress Notes (Signed)
EKG complete. Given to MD

## 2017-07-22 ENCOUNTER — Encounter (HOSPITAL_COMMUNITY)
Admission: EM | Disposition: A | Discharge status: Home / Self Care | Payer: Self-pay | Source: Home / Self Care | Attending: Cardiothoracic Surgery

## 2017-07-22 ENCOUNTER — Other Ambulatory Visit: Payer: Self-pay

## 2017-07-22 ENCOUNTER — Inpatient Hospital Stay (HOSPITAL_COMMUNITY): Payer: BLUE CROSS/BLUE SHIELD

## 2017-07-22 DIAGNOSIS — I25110 Atherosclerotic heart disease of native coronary artery with unstable angina pectoris: Secondary | ICD-10-CM

## 2017-07-22 DIAGNOSIS — R079 Chest pain, unspecified: Secondary | ICD-10-CM

## 2017-07-22 HISTORY — PX: LEFT HEART CATH AND CORONARY ANGIOGRAPHY: CATH118249

## 2017-07-22 LAB — CBC
HEMATOCRIT: 35.2 % — AB (ref 39.0–52.0)
Hemoglobin: 11.8 g/dL — ABNORMAL LOW (ref 13.0–17.0)
MCH: 31.2 pg (ref 26.0–34.0)
MCHC: 33.5 g/dL (ref 30.0–36.0)
MCV: 93.1 fL (ref 78.0–100.0)
PLATELETS: 164 10*3/uL (ref 150–400)
RBC: 3.78 MIL/uL — ABNORMAL LOW (ref 4.22–5.81)
RDW: 13.2 % (ref 11.5–15.5)
WBC: 9.6 10*3/uL (ref 4.0–10.5)

## 2017-07-22 LAB — GLUCOSE, CAPILLARY
GLUCOSE-CAPILLARY: 133 mg/dL — AB (ref 65–99)
Glucose-Capillary: 101 mg/dL — ABNORMAL HIGH (ref 65–99)
Glucose-Capillary: 192 mg/dL — ABNORMAL HIGH (ref 65–99)

## 2017-07-22 LAB — BLOOD GAS, ARTERIAL
Acid-Base Excess: 1.7 mmol/L (ref 0.0–2.0)
Bicarbonate: 25.2 mmol/L (ref 20.0–28.0)
DRAWN BY: 418751
O2 Content: 21 L/min
O2 Saturation: 96.4 %
PCO2 ART: 35.7 mmHg (ref 32.0–48.0)
PH ART: 7.462 — AB (ref 7.350–7.450)
Patient temperature: 98.6
pO2, Arterial: 79.5 mmHg — ABNORMAL LOW (ref 83.0–108.0)

## 2017-07-22 LAB — BASIC METABOLIC PANEL
ANION GAP: 10 (ref 5–15)
BUN: 15 mg/dL (ref 6–20)
CALCIUM: 8.4 mg/dL — AB (ref 8.9–10.3)
CO2: 24 mmol/L (ref 22–32)
CREATININE: 1 mg/dL (ref 0.61–1.24)
Chloride: 102 mmol/L (ref 101–111)
GFR calc Af Amer: >60 mL/min (ref >60)
GFR calc non Af Amer: >60 mL/min (ref >60)
Glucose, Bld: 134 mg/dL — ABNORMAL HIGH (ref 65–99)
Potassium: 3.8 mmol/L (ref 3.5–5.1)
Sodium: 136 mmol/L (ref 135–145)

## 2017-07-22 LAB — TROPONIN I
TROPONIN I: 18.94 ng/mL — AB (ref <0.03)
Troponin I: 10.55 ng/mL (ref <0.03)
Troponin I: 14.57 ng/mL (ref <0.03)

## 2017-07-22 LAB — APTT: aPTT: 126 seconds — ABNORMAL HIGH (ref 24–36)

## 2017-07-22 LAB — PROTIME-INR
INR: 1.06
INR: 1.1
Prothrombin Time: 13.7 seconds (ref 11.4–15.2)
Prothrombin Time: 14.2 seconds (ref 11.4–15.2)

## 2017-07-22 LAB — ECHOCARDIOGRAM COMPLETE
HEIGHTINCHES: 66 in
WEIGHTICAEL: 2948.87 [oz_av]

## 2017-07-22 LAB — LIPID PANEL
Cholesterol: 103 mg/dL (ref 0–200)
HDL: 30 mg/dL — AB (ref >40)
LDL Cholesterol: 61 mg/dL (ref 0–99)
TRIGLYCERIDES: 60 mg/dL (ref <150)
Total CHOL/HDL Ratio: 3.4 RATIO
VLDL: 12 mg/dL (ref 0–40)

## 2017-07-22 LAB — MRSA PCR SCREENING: MRSA BY PCR: NEGATIVE

## 2017-07-22 LAB — HIV ANTIBODY (ROUTINE TESTING W REFLEX): HIV Screen 4th Generation wRfx: NONREACTIVE

## 2017-07-22 LAB — TSH: TSH: 1.395 u[IU]/mL (ref 0.350–4.500)

## 2017-07-22 LAB — HEPARIN LEVEL (UNFRACTIONATED): HEPARIN UNFRACTIONATED: 0.18 [IU]/mL — AB (ref 0.30–0.70)

## 2017-07-22 SURGERY — LEFT HEART CATH AND CORONARY ANGIOGRAPHY
Anesthesia: LOCAL

## 2017-07-22 MED ORDER — CHLORHEXIDINE GLUCONATE 0.12 % MT SOLN
15.0000 mL | Freq: Once | OROMUCOSAL | Status: AC
  Administered 2017-07-23: 15 mL via OROMUCOSAL
  Filled 2017-07-22: qty 15

## 2017-07-22 MED ORDER — SODIUM CHLORIDE 0.9 % IV SOLN
30.0000 ug/min | INTRAVENOUS | Status: AC
  Administered 2017-07-23: 20 ug/min via INTRAVENOUS
  Filled 2017-07-22: qty 2

## 2017-07-22 MED ORDER — SODIUM CHLORIDE 0.9% FLUSH
3.0000 mL | Freq: Two times a day (BID) | INTRAVENOUS | Status: DC
  Administered 2017-07-22: 3 mL via INTRAVENOUS

## 2017-07-22 MED ORDER — HEPARIN (PORCINE) IN NACL 100-0.45 UNIT/ML-% IJ SOLN
1350.0000 [IU]/h | INTRAMUSCULAR | Status: DC
  Administered 2017-07-22: 1150 [IU]/h via INTRAVENOUS
  Filled 2017-07-22 (×2): qty 250

## 2017-07-22 MED ORDER — METOPROLOL TARTRATE 12.5 MG HALF TABLET
12.5000 mg | ORAL_TABLET | Freq: Once | ORAL | Status: AC
  Administered 2017-07-23: 12.5 mg via ORAL
  Filled 2017-07-22: qty 1

## 2017-07-22 MED ORDER — CHLORHEXIDINE GLUCONATE CLOTH 2 % EX PADS
6.0000 | MEDICATED_PAD | Freq: Once | CUTANEOUS | Status: AC
  Administered 2017-07-23: 6 via TOPICAL

## 2017-07-22 MED ORDER — NITROGLYCERIN IN D5W 200-5 MCG/ML-% IV SOLN
2.0000 ug/min | INTRAVENOUS | Status: DC
  Filled 2017-07-22: qty 250

## 2017-07-22 MED ORDER — METOPROLOL TARTRATE 12.5 MG HALF TABLET
12.5000 mg | ORAL_TABLET | Freq: Two times a day (BID) | ORAL | Status: DC
  Administered 2017-07-22 (×2): 12.5 mg via ORAL
  Filled 2017-07-22 (×2): qty 1

## 2017-07-22 MED ORDER — DEXMEDETOMIDINE HCL IN NACL 400 MCG/100ML IV SOLN
0.1000 ug/kg/h | INTRAVENOUS | Status: AC
  Administered 2017-07-23: .7 ug/kg/h via INTRAVENOUS
  Filled 2017-07-22: qty 100

## 2017-07-22 MED ORDER — LIDOCAINE HCL (PF) 1 % IJ SOLN
INTRAMUSCULAR | Status: AC
  Filled 2017-07-22: qty 30

## 2017-07-22 MED ORDER — TRANEXAMIC ACID (OHS) BOLUS VIA INFUSION
15.0000 mg/kg | INTRAVENOUS | Status: AC
  Administered 2017-07-23: 1254 mg via INTRAVENOUS
  Filled 2017-07-22: qty 1254

## 2017-07-22 MED ORDER — CHLORHEXIDINE GLUCONATE 4 % EX LIQD
CUTANEOUS | Status: AC
  Administered 2017-07-22: 22:00:00
  Filled 2017-07-22: qty 15

## 2017-07-22 MED ORDER — TRANEXAMIC ACID (OHS) PUMP PRIME SOLUTION
2.0000 mg/kg | INTRAVENOUS | Status: DC
  Filled 2017-07-22: qty 1.67

## 2017-07-22 MED ORDER — PLASMA-LYTE 148 IV SOLN
INTRAVENOUS | Status: AC
  Administered 2017-07-23: 500 mL
  Filled 2017-07-22: qty 2.5

## 2017-07-22 MED ORDER — SODIUM CHLORIDE 0.9 % IV SOLN
INTRAVENOUS | Status: AC
  Administered 2017-07-23: 1.6 [IU]/h via INTRAVENOUS
  Filled 2017-07-22: qty 1

## 2017-07-22 MED ORDER — EPINEPHRINE PF 1 MG/ML IJ SOLN
0.0000 ug/min | INTRAVENOUS | Status: DC
  Filled 2017-07-22: qty 4

## 2017-07-22 MED ORDER — HEPARIN SODIUM (PORCINE) 1000 UNIT/ML IJ SOLN
INTRAMUSCULAR | Status: DC | PRN
  Administered 2017-07-22: 4000 [IU] via INTRAVENOUS

## 2017-07-22 MED ORDER — SODIUM CHLORIDE 0.9 % IV SOLN: 250.0000 mL | INTRAVENOUS | Status: DC | PRN

## 2017-07-22 MED ORDER — PERFLUTREN LIPID MICROSPHERE
INTRAVENOUS | Status: AC
  Administered 2017-07-22: 2 mL
  Filled 2017-07-22: qty 10

## 2017-07-22 MED ORDER — SODIUM CHLORIDE 0.9 % IV SOLN
1.5000 g | INTRAVENOUS | Status: AC
  Administered 2017-07-23: 1.5 g via INTRAVENOUS
  Filled 2017-07-22: qty 1.5

## 2017-07-22 MED ORDER — HEPARIN SODIUM (PORCINE) 1000 UNIT/ML IJ SOLN
INTRAMUSCULAR | Status: AC
  Filled 2017-07-22: qty 1

## 2017-07-22 MED ORDER — TRANEXAMIC ACID 1000 MG/10ML IV SOLN
1.5000 mg/kg/h | INTRAVENOUS | Status: AC
  Administered 2017-07-23: 1.5 mg/kg/h via INTRAVENOUS
  Filled 2017-07-22: qty 25

## 2017-07-22 MED ORDER — TEMAZEPAM 15 MG PO CAPS: 15.0000 mg | ORAL_CAPSULE | Freq: Once | ORAL | Status: DC | PRN

## 2017-07-22 MED ORDER — LIDOCAINE HCL (PF) 1 % IJ SOLN
INTRAMUSCULAR | Status: DC | PRN
  Administered 2017-07-22: 2 mL

## 2017-07-22 MED ORDER — VERAPAMIL HCL 2.5 MG/ML IV SOLN
INTRAVENOUS | Status: AC
  Filled 2017-07-22: qty 2

## 2017-07-22 MED ORDER — SODIUM CHLORIDE 0.9 % WEIGHT BASED INFUSION
1.0000 mL/kg/h | INTRAVENOUS | Status: AC
  Administered 2017-07-22 (×2): 1 mL/kg/h via INTRAVENOUS

## 2017-07-22 MED ORDER — IOPAMIDOL (ISOVUE-370) INJECTION 76%
INTRAVENOUS | Status: AC
  Filled 2017-07-22: qty 125

## 2017-07-22 MED ORDER — HEPARIN (PORCINE) IN NACL 2-0.9 UNITS/ML
INTRAMUSCULAR | Status: AC | PRN
  Administered 2017-07-22 (×2): 500 mL

## 2017-07-22 MED ORDER — ~~LOC~~ CARDIAC SURGERY, PATIENT & FAMILY EDUCATION - IN SPANISH
Freq: Once | Status: AC
  Administered 2017-07-22: 23:00:00
  Filled 2017-07-22: qty 1

## 2017-07-22 MED ORDER — POTASSIUM CHLORIDE 2 MEQ/ML IV SOLN
80.0000 meq | INTRAVENOUS | Status: DC
  Filled 2017-07-22: qty 40

## 2017-07-22 MED ORDER — NITROGLYCERIN 1 MG/10 ML FOR IR/CATH LAB
INTRA_ARTERIAL | Status: AC
  Filled 2017-07-22: qty 10

## 2017-07-22 MED ORDER — CHLORHEXIDINE GLUCONATE CLOTH 2 % EX PADS
6.0000 | MEDICATED_PAD | Freq: Once | CUTANEOUS | Status: AC
  Administered 2017-07-22: 6 via TOPICAL

## 2017-07-22 MED ORDER — VERAPAMIL HCL 2.5 MG/ML IV SOLN
INTRAVENOUS | Status: DC | PRN
  Administered 2017-07-22: 10 mL via INTRA_ARTERIAL

## 2017-07-22 MED ORDER — BISACODYL 5 MG PO TBEC
5.0000 mg | DELAYED_RELEASE_TABLET | Freq: Once | ORAL | Status: AC
  Administered 2017-07-22: 5 mg via ORAL
  Filled 2017-07-22: qty 1

## 2017-07-22 MED ORDER — SODIUM CHLORIDE 0.9 % IV SOLN
750.0000 mg | INTRAVENOUS | Status: DC
  Filled 2017-07-22: qty 750

## 2017-07-22 MED ORDER — SODIUM CHLORIDE 0.9 % IV SOLN
INTRAVENOUS | Status: DC
  Filled 2017-07-22: qty 30

## 2017-07-22 MED ORDER — DOPAMINE-DEXTROSE 3.2-5 MG/ML-% IV SOLN
0.0000 ug/kg/min | INTRAVENOUS | Status: DC
  Filled 2017-07-22: qty 250

## 2017-07-22 MED ORDER — SODIUM CHLORIDE 0.9% FLUSH: 3.0000 mL | INTRAVENOUS | Status: DC | PRN

## 2017-07-22 MED ORDER — VANCOMYCIN HCL 10 G IV SOLR
1250.0000 mg | INTRAVENOUS | Status: AC
  Administered 2017-07-23: 1250 mg via INTRAVENOUS
  Filled 2017-07-22: qty 1250

## 2017-07-22 MED ORDER — PERFLUTREN LIPID MICROSPHERE
1.0000 mL | INTRAVENOUS | Status: AC | PRN
  Administered 2017-07-22: 2 mL via INTRAVENOUS
  Filled 2017-07-22: qty 10

## 2017-07-22 MED ORDER — HEPARIN (PORCINE) IN NACL 1000-0.9 UT/500ML-% IV SOLN
INTRAVENOUS | Status: AC
  Filled 2017-07-22: qty 1000

## 2017-07-22 MED ORDER — MAGNESIUM SULFATE 50 % IJ SOLN
40.0000 meq | INTRAMUSCULAR | Status: DC
  Filled 2017-07-22: qty 9.85

## 2017-07-22 SURGICAL SUPPLY — 14 items
CATH INFINITI 5 FR JL3.5 (CATHETERS) ×1 IMPLANT
CATH INFINITI 5FR ANG PIGTAIL (CATHETERS) ×1 IMPLANT
CATH INFINITI JR4 5F (CATHETERS) ×1 IMPLANT
DEVICE RAD COMP TR BAND LRG (VASCULAR PRODUCTS) ×1 IMPLANT
GUIDEWIRE INQWIRE 1.5J.035X260 (WIRE) IMPLANT
INQWIRE 1.5J .035X260CM (WIRE) ×2
KIT HEART LEFT (KITS) ×2 IMPLANT
NDL PERC 21GX4CM (NEEDLE) IMPLANT
NEEDLE PERC 21GX4CM (NEEDLE) ×2 IMPLANT
PACK CARDIAC CATHETERIZATION (CUSTOM PROCEDURE TRAY) ×2 IMPLANT
SHEATH RAIN RADIAL 21G 6FR (SHEATH) ×1 IMPLANT
SYR MEDRAD MARK V 150ML (SYRINGE) ×2 IMPLANT
TRANSDUCER W/STOPCOCK (MISCELLANEOUS) ×2 IMPLANT
TUBING CIL FLEX 10 FLL-RA (TUBING) ×2 IMPLANT

## 2017-07-22 NOTE — ED Notes (Signed)
Dr. Mayford Knifeurner explained procedure to patient and family via phone with this nurse as witness. Patient and family verbalized understanding of procedure and potential risks with no further questions a this time. Consent form obtained at this time.

## 2017-07-22 NOTE — ED Notes (Signed)
Radiotranslucent Zoll Pads applied. Pt undressed completely, pedal pulses marked. [facility], report given to Cath lab team.

## 2017-07-22 NOTE — Progress Notes (Signed)
Patient ID: Alain HoneyRefugio Toole, male   DOB: 12/29/1960, 57 y.o.   MRN: [id number]      [address]       [name] [id number]             [phone]     Pre Procedure note for inpatients:   Cordie Heffley has been scheduled for Procedure(s): CABG. The various methods of treatment have been discussed with the patient through the interpretor and by me through his adult daughter who is fluent in [place] . After consideration of the risks, benefits and treatment options the patient has consented to the planned procedure.  The goals risks and alternatives of the planned surgical procedure   have been discussed with the patient in detail. The risks of the procedure including death, infection, stroke, myocardial infarction, bleeding, blood transfusion have all been discussed specifically.  I have quoted Butch Runkle a 2% of perioperative mortality and a complication rate as high as 40  %. The patient's questions have been answered.Kanan Melina Schoolsburto is willing  to proceed with the planned procedure.  The patient has been seen and labs reviewed. There are no changes in the patient's condition to prevent proceeding with the planned procedure today.  I located  the cath films done during the night on the cath lab system and agree with proceeding with cabg .  Recent labs:  Lab Results  Component Value Date   WBC 9.6 07/22/2017   HGB 11.8 (L) 07/22/2017   HCT 35.2 (L) 07/22/2017   PLT 164 07/22/2017   GLUCOSE 134 (H) 07/22/2017   CHOL 103 07/22/2017   TRIG 60 07/22/2017   HDL 30 (L) 07/22/2017   LDLCALC 61 07/22/2017   ALT 33 07/21/2017   AST 89 (H) 07/21/2017   NA 136 07/22/2017   K 3.8 07/22/2017   CL 102 07/22/2017   CREATININE 1.00 07/22/2017   BUN 15 07/22/2017   CO2 24 07/22/2017   TSH 1.395 07/21/2017   INR 1.06 07/22/2017   HGBA1C 6.7 (H) 07/21/2017    Delight OvensEdward B Makell Cyr, MD 07/22/2017 7:22 PM

## 2017-07-22 NOTE — ED Notes (Signed)
Spoke with Dr. Mayford Knifeurner in regards to troponin results. No further orders at this time. Provider states she will speak with STEMI team for recommendations.

## 2017-07-22 NOTE — Progress Notes (Signed)
   07/22/17 0100  Clinical Encounter Type  Visited With Patient and family together;Health care provider  Visit Type ED  Referral From Nurse  Consult/Referral To Chaplain  Stress Factors  Family Stress Factors Health changes   Responded to a Code Stemi.  Medical staff was waiting for Cath Team to assemble.  Spoke with the family (only the daughter speaks [place]).  When patient went to Cath Lab I escorted the family to the waiting area. Offered hospitality.  Will follow and support as needed. Chaplain Agustin CreeNewton Columbia Pandey

## 2017-07-22 NOTE — Progress Notes (Signed)
Subjective:  Denies SSCP, palpitations or Dyspnea He does not speak english interview done with mobile / video interpretor  Objective:  Vitals:   07/22/17 1030 07/22/17 1100 07/22/17 1221 07/22/17 1300  BP: 111/71 111/76  112/79  Pulse: 78 75  77  Resp:  (!) 21  19  Temp:   98.7 F (37.1 C)   TempSrc:   Oral   SpO2:  100%  100%  Weight:      Height:        Intake/Output from previous day:  Intake/Output Summary (Last 24 hours) at 07/22/2017 1421 Last data filed at 07/22/2017 0830 Gross per 24 hour  Intake 763 ml  Output 1100 ml  Net -337 ml    Physical Exam: Affect appropriate Healthy:  appears stated age HEENT: normal Neck supple with no adenopathy JVP normal no bruits no thyromegaly Lungs clear with no wheezing and good diaphragmatic motion Heart:  S1/S2 no murmur, no rub, gallop or click PMI normal Abdomen: benighn, BS positve, no tenderness, no AAA no bruit.  No HSM or HJR Distal pulses intact with no bruits No edema Neuro non-focal Skin warm and dry No muscular weakness Right radial A no hematoma   Lab Results: Basic Metabolic Panel: Recent Labs    07/21/17 1818 07/22/17 0303  NA 137 136  K 3.8 3.8  CL 100* 102  CO2 24 24  GLUCOSE 128* 134*  BUN 11 15  CREATININE 1.10 1.00  CALCIUM 9.4 8.4*  MG 1.6*  --   PHOS 3.6  --    Liver Function Tests: Recent Labs    07/21/17 1818  AST 89*  ALT 33  ALKPHOS 82  BILITOT 1.1  PROT 7.6  ALBUMIN 4.3   No results for input(s): LIPASE, AMYLASE in the last 72 hours. CBC: Recent Labs    07/21/17 1818 07/22/17 0303  WBC 13.3* 9.6  HGB 14.1 11.8*  HCT 41.5 35.2*  MCV 93.5 93.1  PLT 187 164   Cardiac Enzymes: Recent Labs    07/21/17 2152 07/22/17 0303 07/22/17 0942  TROPONINI 10.55* 18.94* 14.57*   BNP: Invalid input(s): POCBNP D-Dimer: No results for input(s): DDIMER in the last 72 hours. Hemoglobin A1C: Recent Labs    07/21/17 2153  HGBA1C 6.7*   Fasting Lipid Panel: Recent  Labs    07/22/17 0303  CHOL 103  HDL 30*  LDLCALC 61  TRIG 60  CHOLHDL 3.4   Thyroid Function Tests: Recent Labs    07/21/17 2153  TSH 1.395   Anemia Panel: No results for input(s): VITAMINB12, FOLATE, FERRITIN, TIBC, IRON, RETICCTPCT in the last 72 hours.  Imaging: Dg Chest Portable 1 View  Result Date: 07/21/2017 CLINICAL DATA:  Intermittent chest pain for 3 days, now constant. EXAM: PORTABLE CHEST 1 VIEW COMPARISON:  Chest radiograph November 01, 2014 FINDINGS: Cardiomediastinal silhouette is unremarkable for this low inspiratory examination with crowded vasculature markings. The lungs are clear without pleural effusions or focal consolidations. Persistently elevated RIGHT hemidiaphragm. Trachea projects midline and there is no pneumothorax. Included soft tissue planes and osseous structures are non-suspicious. IMPRESSION: No acute cardiopulmonary process for this low inspiratory examination. Electronically Signed   By: Awilda Metro M.D.   On: 07/21/2017 19:12    Cardiac Studies:  ECG: Slight ST elevation lead 2 T wave inversions 3,F    Telemetry: NSR no VT 07/22/2017   Echo: EF 55-60% mild to moderate MR   Medications:   . aspirin EC  81 mg Oral  Daily  . atorvastatin  80 mg Oral q1800  . metoprolol tartrate  12.5 mg Oral BID  . sodium chloride flush  3 mL Intravenous Q12H     . sodium chloride    . heparin 1,150 Units/hr (07/22/17 1101)  . nitroGLYCERIN Stopped (07/22/17 0450)    Assessment/Plan:   MI: aborted ST elevation inferior MI. Troponin peak 18.9 EF preserved by Echo. On heparin Reviewed cath films culprit is mid RCA have left voicemail for Dr Cornelius Moraswen who is on OR Will order pre cabg dopplers Continue ASA heparin beta blocker and statin   HLD on station    DM:  Metformin held SS insulin A1c 6.7   Time spent reviewing chart angiogram arranging video interpretor discussing care with Dr Cornelius Moraswen, nurse Patient and wife and examining patient 35 minutes   [name]  [name] 07/22/2017, 2:21 PM

## 2017-07-22 NOTE — Anesthesia Preprocedure Evaluation (Addendum)
Anesthesia Evaluation  Patient identified by MRN, date of birth, ID band Patient awake    Reviewed: Allergy & Precautions, NPO status , Patient's Chart, lab work & pertinent test results, reviewed documented beta blocker date and time   Airway Mallampati: II  TM Distance: >3 FB Neck ROM: Full    Dental  (+) Teeth Intact, Dental Advisory Given,    Pulmonary former smoker,    breath sounds clear to auscultation       Cardiovascular hypertension, Pt. on medications + angina + CAD and + Past MI  Normal cardiovascular exam Rhythm:Regular Rate:Normal  Low normal EF. Mild/ mod MR. ?apical low flow vs thrombus   Neuro/Psych negative neurological ROS     GI/Hepatic negative GI ROS, Neg liver ROS,   Endo/Other  diabetes, Type 2, Oral Hypoglycemic Agents  Renal/GU negative Renal ROS     Musculoskeletal   Abdominal   Peds  Hematology  (+) anemia ,   Anesthesia Other Findings   Reproductive/Obstetrics                           Lab Results  Component Value Date   WBC 9.6 07/22/2017   HGB 11.8 (L) 07/22/2017   HCT 35.2 (L) 07/22/2017   MCV 93.1 07/22/2017   PLT 164 07/22/2017   Lab Results  Component Value Date   CREATININE 1.00 07/22/2017   BUN 15 07/22/2017   NA 136 07/22/2017   K 3.8 07/22/2017   CL 102 07/22/2017   CO2 24 07/22/2017   Lab Results  Component Value Date   INR 1.06 07/22/2017    Anesthesia Physical Anesthesia Plan  ASA: IV  Anesthesia Plan: General   Post-op Pain Management:    Induction: Intravenous  PONV Risk Score and Plan: 2 and Treatment may vary due to age or medical condition, Ondansetron and Midazolam  Airway Management Planned: Oral ETT  Additional Equipment: Arterial line, CVP, PA Cath, TEE and Ultrasound Guidance Line Placement  Intra-op Plan:   Post-operative Plan: Post-operative intubation/ventilation  Informed Consent: I have reviewed the  patients History and Physical, chart, labs and discussed the procedure including the risks, benefits and alternatives for the proposed anesthesia with the patient or authorized representative who has indicated his/her understanding and acceptance.   Dental advisory given  Plan Discussed with: CRNA  Anesthesia Plan Comments:        Anesthesia Quick Evaluation

## 2017-07-22 NOTE — Interval H&P Note (Signed)
History and Physical Interval Note:  07/22/2017 1:09 AM  [name]  has presented today for surgery, with the diagnosis of STEMI  The various methods of treatment have been discussed with the patient and family. After consideration of risks, benefits and other options for treatment, the patient has consented to  Procedure(s): Coronary/Graft Acute MI Revascularization (N/A) LEFT HEART CATH AND CORONARY ANGIOGRAPHY (N/A) as a surgical intervention .  The patient's history has been reviewed, patient examined, no change in status, stable for surgery.  I have reviewed the patient's chart and labs.  Questions were answered to the patient's satisfaction.    Cath Lab Visit (complete for each Cath Lab visit)  Clinical Evaluation Leading to the Procedure:   ACS: Yes.    Non-ACS:    Anginal Classification: CCS IV  Anti-ischemic medical therapy: Maximal Therapy (2 or more classes of medications)  Non-Invasive Test Results: No non-invasive testing performed  Prior CABG: No previous CABG       Peter SwazilandJordan MD,FACC 07/22/2017 1:09 AM

## 2017-07-22 NOTE — Progress Notes (Signed)
  Echocardiogram 2D Echocardiogram has been performed.  Janalyn HarderWest, Cloyde Oregel R 07/22/2017, 12:35 PM

## 2017-07-22 NOTE — Progress Notes (Signed)
ANTICOAGULATION CONSULT NOTE   Pharmacy Consult for Heparin Indication: chest pain/ACS  No Known Allergies  Patient Measurements: Height: 5\' 6"  (167.6 cm) Weight: 184 lb 4.9 oz (83.6 kg) IBW/kg (Calculated) : 63.8  Vital Signs: Temp: 98.5 F (36.9 C) (04/21 1629) Temp Source: Oral (04/21 1629) BP: 107/64 (04/21 1500) Pulse Rate: 86 (04/21 1500)   Medical History: Past Medical History:  Diagnosis Date  . Diabetes mellitus without complication (HCC)   . Hyperlipidemia   . Hypertension     Assessment: 57 year old male s/p cath continues on heparin Heparin level = 0.18  Goal of Therapy:  Heparin level 0.3-0.7 units/ml Monitor platelets by anticoagulation protocol: Yes   Plan:  Increase heparin to 1300 units / hr Follow up AM labs  Thank you Okey RegalLisa Nayely Dingus, PharmD [phone]  07/22/2017,5:35 PM

## 2017-07-22 NOTE — Progress Notes (Signed)
Called by nurse due to patient having ongoing CP 3/10 despite IV Heparin gtt and IV NTG gtt.  Troponin has increased from 5.83 now up to 10.55.  Discussed case with Dr. SwazilandJordan and recommend proceeding with urgent cath now due to evidence of ongoing ischemia as noted by persistent CP and uptrending troponin.  Cath lab has been activated.

## 2017-07-22 NOTE — Consult Note (Signed)
[address]       [name] [address]             [phone]          CARDIOTHORACIC SURGERY CONSULTATION REPORT  PCP is Sandre Kitty, PA-C Referring Provider is Charlton Haws, MD  Reason for consultation:  Severe 3-vessel CAD s/p acute non-STEMI  HPI:  Patient is a 56 year old Hispanic male with no previous cardiac history but risk factors notable for history of type 2 diabetes mellitus, hypertension, hyperlipidemia, and remote history of tobacco use.  Patient states that he began to experience substernal burning chest pain approximately 3 days ago.  Symptoms wax and wane for several days but became constant and persisted yesterday.  He denies associated shortness of breath, nausea, or diaphoresis.  Patient initially went to urgent care where EKG revealed T wave inversions in inferior leads.  Initial troponin levels were elevated.  He was evaluated initially by Dr. Mayford Knife and admitted for non-ST segment elevation myocardial infarction.  The patient had recurrent chest pain and was subsequently taken directly to the Cath Lab by Dr. Swaziland.  Catheterization revealed severe three-vessel coronary artery disease with preserved left ventricular function.  Cardiothoracic surgical consultation was requested.    The patient is married and lives locally in [place] with his wife.  He is Hispanic and speaks no [place].  His entire consultation and review of systems was obtained with use of an interpreter.  The patient works doing Holiday representative.  He denies any previous history of chest pain or chest tightness.  He is active physically.  He denies any history of exertional shortness of breath, PND, orthopnea, or lower extremity edema.  Past Medical History:  Diagnosis Date  . Diabetes mellitus without complication (HCC)   . Hyperlipidemia   . Hypertension     History reviewed. No pertinent surgical history.  Family History  Problem Relation Age of Onset  . Heart  disease Mother     Social History   Socioeconomic History  . Marital status: Married    Spouse name: Not on file  . Number of children: Not on file  . Years of education: Not on file  . Highest education level: Not on file  Occupational History  . Not on file  Social Needs  . Financial resource strain: Not on file  . Food insecurity:    Worry: Not on file    Inability: Not on file  . Transportation needs:    Medical: Not on file    Non-medical: Not on file  Tobacco Use  . Smoking status: Former Smoker    Last attempt to quit: 07/22/1998    Years since quitting: 19.0  . Smokeless tobacco: Never Used  Substance and Sexual Activity  . Alcohol use: Yes  . Drug use: No  . Sexual activity: Not on file  Lifestyle  . Physical activity:    Days per week: Not on file    Minutes per session: Not on file  . Stress: Not on file  Relationships  . Social connections:    Talks on phone: Not on file    Gets together: Not on file    Attends religious service: Not on file    Active member of club or organization: Not on file    Attends meetings of clubs or organizations: Not on file    Relationship status: Not on file  . Intimate partner violence:    Fear of current or  ex partner: Not on file    Emotionally abused: Not on file    Physically abused: Not on file    Forced sexual activity: Not on file  Other Topics Concern  . Not on file  Social History Narrative  . Not on file    Prior to Admission medications   Medication Sig Start Date End Date Taking? Authorizing Provider  atorvastatin (LIPITOR) 40 MG tablet Take 40 mg by mouth at bedtime. 07/06/17  Yes [provider]  losartan-hydrochlorothiazide (HYZAAR) 100-25 MG tablet Take 1 tablet by mouth daily. 07/06/17  Yes [provider]  metFORMIN (GLUCOPHAGE) 1000 MG tablet Take 1,000 mg by mouth 2 (two) times daily. 07/06/17  Yes [provider]  acetaminophen (TYLENOL) 500 MG tablet Take 1 tablet (500 mg  total) by mouth every 6 (six) hours as needed. Patient not taking: Reported on 07/21/2017 11/01/14   Piepenbrink, Victorino Dike, PA-C  ibuprofen (ADVIL,MOTRIN) 600 MG tablet Take 1 tablet (600 mg total) by mouth every 6 (six) hours as needed. Patient not taking: Reported on 07/21/2017 11/01/14   Piepenbrink, Victorino Dike, PA-C  lidocaine (XYLOCAINE) 2 % solution Use as directed 20 mLs in the mouth or throat as needed for mouth pain. Patient not taking: Reported on 07/21/2017 11/01/14   Francee Piccolo, PA-C    Current Facility-Administered Medications  Medication Dose Route Frequency Provider Last Rate Last Dose  . 0.9 %  sodium chloride infusion  250 mL Intravenous PRN Swaziland, Peter M, MD      . acetaminophen (TYLENOL) tablet 650 mg  650 mg Oral Q4H PRN Swaziland, Peter M, MD      . aspirin EC tablet 81 mg  81 mg Oral Daily Swaziland, Peter M, MD   81 mg at 07/22/17 1030  . atorvastatin (LIPITOR) tablet 80 mg  80 mg Oral q1800 Swaziland, Peter M, MD   80 mg at 07/22/17 1708  . heparin ADULT infusion 100 units/mL (25000 units/251mL sodium chloride 0.45%)  1,350 Units/hr Intravenous Continuous Turner, Traci R, MD 11.5 mL/hr at 07/22/17 1101 1,150 Units/hr at 07/22/17 1101  . metoprolol tartrate (LOPRESSOR) tablet 12.5 mg  12.5 mg Oral BID Swaziland, Peter M, MD   12.5 mg at 07/22/17 1030  . nitroGLYCERIN (NITROSTAT) SL tablet 0.4 mg  0.4 mg Sublingual Q5 Min x 3 PRN Swaziland, Peter M, MD      . nitroGLYCERIN 50 mg in dextrose 5 % 250 mL (0.2 mg/mL) infusion  0-200 mcg/min Intravenous Titrated Swaziland, Peter M, MD   Stopped at 07/22/17 [phone]  . ondansetron (ZOFRAN) injection 4 mg  4 mg Intravenous Q6H PRN Swaziland, Peter M, MD      . sodium chloride flush (NS) 0.9 % injection 3 mL  3 mL Intravenous Q12H Swaziland, Peter M, MD   3 mL at 07/22/17 1101  . sodium chloride flush (NS) 0.9 % injection 3 mL  3 mL Intravenous PRN Swaziland, Peter M, MD        No Known Allergies    Review of Systems:   General:  normal appetite,  normal energy, no weight gain, no weight loss, no fever  Cardiac:  + chest pain with exertion, + chest pain at rest, no SOB with exertion, no resting SOB, no PND, no orthopnea, no palpitations, no arrhythmia, no atrial fibrillation, no LE edema, no dizzy spells, no syncope  Respiratory:  no shortness of breath, no home oxygen, no productive cough, no dry cough, no bronchitis, no wheezing, no hemoptysis, no asthma, no  pain with inspiration or cough, no sleep apnea, no CPAP at night  GI:   no difficulty swallowing, no reflux, no frequent heartburn, no hiatal hernia, no abdominal pain, no constipation, no diarrhea, no hematochezia, no hematemesis, no melena  GU:   no dysuria,  no frequency, no urinary tract infection, no hematuria, no enlarged prostate, no kidney stones, no kidney disease  Vascular:  no pain suggestive of claudication, no pain in feet, no leg cramps, no varicose veins, no DVT, no non-healing foot ulcer  Neuro:   no stroke, no TIA's, no seizures, no headaches, no temporary blindness one eye,  no slurred speech, no peripheral neuropathy, no chronic pain, no instability of gait, no memory/cognitive dysfunction  Musculoskeletal: no arthritis , no joint swelling, no myalgias, no difficulty walking, normal mobility   Skin:   no rash, no itching, no skin infections, no pressure sores or ulcerations  Psych:   no anxiety, no depression, no nervousness, no unusual recent stress  Eyes:   no blurry vision, no floaters, no recent vision changes, does not wears glasses or contacts  ENT:   no hearing loss, no loose or painful teeth, no dentures, last saw dentist a few years ago  Hematologic:  no easy bruising, no abnormal bleeding, no clotting disorder, no frequent epistaxis  Endocrine:  + diabetes, does check CBG's at home     Physical Exam:   BP 107/64   Pulse 86   Temp 98.5 F (36.9 C) (Oral)   Resp 20   Ht 5\' 6"  (1.676 m)   Wt 184 lb 4.9 oz (83.6 kg)   SpO2 100%   BMI 29.75 kg/m    General:    well-appearing  HEENT:  Unremarkable   Neck:   no JVD, no bruits, no adenopathy   Chest:   clear to auscultation, symmetrical breath sounds, no wheezes, no rhonchi   CV:   RRR, no  murmur   Abdomen:  soft, non-tender, no masses   Extremities:  warm, well-perfused, pulses palpable, no lower extremity edema  Rectal/GU  Deferred  Neuro:   Grossly non-focal and symmetrical throughout  Skin:   Clean and dry, no rashes, no breakdown  Diagnostic Tests:  Lab Results: Recent Labs    07/21/17 1818 07/22/17 0303  WBC 13.3* 9.6  HGB 14.1 11.8*  HCT 41.5 35.2*  PLT 187 164   BMET:  Recent Labs    07/21/17 1818 07/22/17 0303  NA 137 136  K 3.8 3.8  CL 100* 102  CO2 24 24  GLUCOSE 128* 134*  BUN 11 15  CREATININE 1.10 1.00  CALCIUM 9.4 8.4*    CBG (last 3)  Recent Labs    07/22/17 0808 07/22/17 1220 07/22/17 1643  GLUCAP 133* 101* 192*   PT/INR:   Recent Labs    07/22/17 0303  LABPROT 13.7  INR 1.06    CXR:  PORTABLE CHEST 1 VIEW  COMPARISON:  Chest radiograph November 01, 2014  FINDINGS: Cardiomediastinal silhouette is unremarkable for this low inspiratory examination with crowded vasculature markings. The lungs are clear without pleural effusions or focal consolidations. Persistently elevated RIGHT hemidiaphragm. Trachea projects midline and there is no pneumothorax. Included soft tissue planes and osseous structures are non-suspicious.  IMPRESSION: No acute cardiopulmonary process for this low inspiratory examination.   Electronically Signed   By: Awilda Metro M.D.   On: 07/21/2017 19:12    LEFT HEART CATH AND CORONARY ANGIOGRAPHY  Conclusion     Prox RCA lesion  is 60% stenosed.  Mid RCA lesion is 99% stenosed.  Ost LM lesion is 50% stenosed.  Ost LAD to Prox LAD lesion is 50% stenosed.  Prox LAD lesion is 85% stenosed.  Ost 1st Diag to 1st Diag lesion is 90% stenosed.  Ost 1st Mrg to 1st Mrg lesion is 70%  stenosed.  Ost 2nd Mrg to 2nd Mrg lesion is 50% stenosed.  The left ventricular systolic function is normal.  LV end diastolic pressure is normal.  The left ventricular ejection fraction is 50-55% by visual estimate.   1. Severe 3 vessel obstructive CAD. There is some ostial left main disease that is not severe but there is dampening of pressure with catheter engagement.    - complex bifurcation LAD/large diagonal stenosis.     - moderate OM disease    - severe mid RCA stenosis.  2. Low normal EF with inferior Hypokinesis 3. Normal LVEDP  Plan: given complex 3 vessel CAD and diabetes I would recommend CABG for revascularization. Continue optimal medical therapy. Would consult CT surgery in am.    Indications   Non-ST elevation (NSTEMI) myocardial infarction (HCC) [I21.4 (ICD-10-CM)]  Procedural Details/Technique   Technical Details Indication: 57 yo Hispanic male presents with a NSTEMI. He has ongoing chest pain.  Procedural Details: The right wrist was prepped, draped, and anesthetized with 1% lidocaine. Using the modified Seldinger technique, a 6 French slender sheath was introduced into the right radial artery. 3 mg of verapamil was administered through the sheath, weight-based unfractionated heparin was administered intravenously. Standard Judkins catheters were used for selective coronary angiography and left ventriculography. Catheter exchanges were performed over an exchange length guidewire. There were no immediate procedural complications. A TR band was used for radial hemostasis at the completion of the procedure. The patient was transferred to the post catheterization recovery area for further monitoring.  Contrast: 100 cc   Estimated blood loss <50 mL.  During this procedure no sedation was administered.  Complications   Complications documented before study signed (07/22/2017 2:11 AM EDT)    No complications were associated with this study.  Documented by Swaz[place]and,  Peter M, MD - 07/22/2017 1:49 AM EDT    Coronary Findings   Diagnostic  Dominance: Right  Left Main  Ost LM lesion 50% stenosed  Ost LM lesion is 50% stenosed. There is dampening of pressure with any catheter engagement.  Left Anterior Descending  Ost LAD to Prox LAD lesion 50% stenosed  Ost LAD to Prox LAD lesion is 50% stenosed. The lesion is moderately calcified.  Prox LAD lesion 85% stenosed  Prox LAD lesion is 85% stenosed.  First Diagonal Branch  Ost 1st Diag to 1st Diag lesion 90% stenosed  Ost 1st Diag to 1st Diag lesion is 90% stenosed.  Left Circumflex  First Obtuse Marginal Branch  Ost 1st Mrg to 1st Mrg lesion 70% stenosed  Ost 1st Mrg to 1st Mrg lesion is 70% stenosed.  Second Obtuse Marginal Branch  Ost 2nd Mrg to 2nd Mrg lesion 50% stenosed  Ost 2nd Mrg to 2nd Mrg lesion is 50% stenosed.  Right Coronary Artery  Prox RCA lesion 60% stenosed  Prox RCA lesion is 60% stenosed.  Mid RCA lesion 99% stenosed  Mid RCA lesion is 99% stenosed.  Intervention   No interventions have been documented.  Wall Motion              Left Heart   Left Ventricle The left ventricular size is normal. The left ventricular  systolic function is normal. LV end diastolic pressure is normal. The left ventricular ejection fraction is 50-55% by visual estimate. There are LV function abnormalities due to segmental dysfunction.  Coronary Diagrams   Diagnostic Diagram       Implants    No implant documentation for this case.  MERGE Images   Show images for CARDIAC CATHETERIZATION   Link to Procedure Log   Procedure Log    Hemo Data    Most Recent Value  LV Systolic Pressure 106 mmHg  LV Diastolic Pressure 5 mmHg  LV EDP 18 mmHg  Arterial Occlusion Pressure Extended Systolic Pressure 98 mmHg  Arterial Occlusion Pressure Extended Diastolic Pressure 61 mmHg  Arterial Occlusion Pressure Extended Mean Pressure 78 mmHg  Left Ventricular Apex Extended Systolic Pressure 94  mmHg  Left Ventricular Apex Extended Diastolic Pressure 6 mmHg  Left Ventricular Apex Extended EDP Pressure     Transthoracic Echocardiography  Patient:    [name], [name] MR #:       [id number] Study Date: 07/22/2017 Gender:     M Age:        41 Height:     167.6 cm Weight:     83.6 kg BSA:        2 m^2 Pt. Status: Room:       2H26C   ORDERING     Peter Swaziland, M.D.  REFERRING    Peter Swaziland, M.D.  ADMITTING    Armanda Magic, MD  REFERRING    Armanda Magic, MD  PERFORMING   [facility], Inpatient  SONOGRAPHER  [facility]  ATTENDING    Mackuen, Courteney Lyn  cc:  ------------------------------------------------------------------- LV EF: 55% -   60%  ------------------------------------------------------------------- Indications:      Chest pain 786.51.  ------------------------------------------------------------------- History:   PMH:   Myocardial infarction.  Risk factors: Hypertension. Diabetes mellitus. Dyslipidemia.  ------------------------------------------------------------------- Study Conclusions  - Left ventricle: The cavity size was normal. Wall thickness was   increased in a pattern of mild LVH. Systolic function was normal.   The estimated ejection fraction was in the range of 55% to 60%.   Diastolic function is indeterminant. Small semi-mobile echo   contrast filling defect noted in multiple views suggesting of   small apical thrombus. - Regional wall motion abnormality: Hypokinesis of the apical   myocardium. - Aortic valve: Valve area (VTI): 3.11 cm^2. Valve area (Vmax):   2.61 cm^2. Valve area (Vmean): 3.04 cm^2. - Mitral valve: There was mild to moderate regurgitation. The MR VC   is 0.4 cm. - Pulmonary arteries: PA peak pressure: 31 mm Hg (S). PASP is   borderline elevated. - Technically difficult study, echocontrast was used to enhance   visualization.  ------------------------------------------------------------------- Study data:  No  prior study was available for comparison.  Study status:  Routine.  Procedure:  The patient reported no pain pre or post test. Transthoracic echocardiography. Image quality was suboptimal. The study was technically difficult, as a result of poor sound wave transmission. Intravenous contrast (Definity) was administered.  Study completion:  There were no complications.     Transthoracic echocardiography.  M-mode, complete 2D, spectral Doppler, and color Doppler.  Birthdate:  Patient birthdate: November 02, 1960.  Age:  Patient is 57 yr old.  Sex:  Gender: male. BMI: 29.8 kg/m^2.  Blood pressure:     111/76  Patient status: Inpatient.  Study date:  Study date: 07/22/2017. Study time: 11:45 AM.  Location:  ICU/CCU  -------------------------------------------------------------------  ------------------------------------------------------------------- Left ventricle:  The cavity size  was normal. Wall thickness was increased in a pattern of mild LVH. Systolic function was normal. The estimated ejection fraction was in the range of 55% to 60%. Diastolic function is indeterminant. Small semi-mobile echo contrast filling defect noted in multiple views suggesting of small apical thrombus.  Regional wall motion abnormalities:  Hypokinesis of the apical myocardium.  ------------------------------------------------------------------- Aortic valve:   Trileaflet; normal thickness leaflets.  Doppler: There was no stenosis.   There was no significant regurgitation. VTI ratio of LVOT to aortic valve: 0.99. Valve area (VTI): 3.11 cm^2. Indexed valve area (VTI): 1.56 cm^2/m^2. Peak velocity ratio of LVOT to aortic valve: 0.83. Valve area (Vmax): 2.61 cm^2. Indexed valve area (Vmax): 1.31 cm^2/m^2. Mean velocity ratio of LVOT to aortic valve: 0.97. Valve area (Vmean): 3.04 cm^2. Indexed valve area (Vmean): 1.52 cm^2/m^2.    Mean gradient (S): 3 mm Hg. Peak gradient (S): 7 mm  Hg.  ------------------------------------------------------------------- Aorta:  Aortic root: The aortic root was normal in size.  ------------------------------------------------------------------- Mitral valve:   Normal thickness leaflets .  Doppler:   There was no evidence for stenosis.   There was mild to moderate regurgitation. The MR VC is 0.4 cm.    Valve area by pressure half-time: 4.78 cm^2. Indexed valve area by pressure half-time: 2.39 cm^2/m^2.    Peak gradient (D): 3 mm Hg.  ------------------------------------------------------------------- Left atrium:  The atrium was normal in size.  ------------------------------------------------------------------- Atrial septum:  Poorly visualized.  ------------------------------------------------------------------- Right ventricle:  The cavity size was normal. Systolic function was normal.  ------------------------------------------------------------------- Pulmonic valve:   Not well visualized.  Doppler:   There was no evidence for stenosis.   There was mild regurgitation.  ------------------------------------------------------------------- Tricuspid valve:   Normal thickness leaflets.  Doppler:   There was no evidence for stenosis.   There was mild regurgitation.  ------------------------------------------------------------------- Pulmonary artery:   PASP is borderline elevated.  ------------------------------------------------------------------- Right atrium:  The atrium was normal in size.  ------------------------------------------------------------------- Pericardium:  There was no pericardial effusion.  ------------------------------------------------------------------- Systemic veins: Inferior vena cava: The vessel was normal in size. The respirophasic diameter changes were in the normal range (>= 50%), consistent with normal central venous  pressure.  ------------------------------------------------------------------- Measurements   Left ventricle                          Value           Reference  LV ID, ED, PLAX chordal                 49     mm       43 - 52  LV ID, ES, PLAX chordal                 38     mm       23 - 38  LV fx shortening, PLAX chordal  (L)     22     %        >=29  LV PW thickness, ED                     11     mm       ----------  IVS/LV PW ratio, ED                     1.09            <=1.3  Stroke volume, 2D  68     ml       ----------  Stroke volume/bsa, 2D                   34     ml/m^2   ----------  LV e&', lateral                          16.8   cm/s     ----------  LV E/e&', lateral                        5.24            ----------  LV e&', medial                           7.94   cm/s     ----------  LV E/e&', medial                         11.08           ----------  LV e&', average                          12.37  cm/s     ----------  LV E/e&', average                        7.11            ----------  LV ejection time                        260    ms       ----------    Ventricular septum                      Value           Reference  IVS thickness, ED                       12     mm       ----------    LVOT                                    Value           Reference  LVOT ID, S                              20     mm       ----------  LVOT area                               3.14   cm^2     ----------  LVOT peak velocity, S                   109    cm/s     ----------  LVOT mean velocity, S                   80.7   cm/s     ----------  LVOT  VTI, S                             21.5   cm       ----------  LVOT peak gradient, S                   5      mm Hg    ----------    Aortic valve                            Value           Reference  Aortic valve peak velocity, S           130.79 cm/s     ----------  Aortic valve mean velocity, S           83.33  cm/s      ----------  Aortic valve VTI, S                     21.71  cm       ----------  Aortic mean gradient, S                 3      mm Hg    ----------  Aortic peak gradient, S                 7      mm Hg    ----------  VTI ratio, LVOT/AV                      0.99            ----------  Aortic valve area, VTI                  3.11   cm^2     ----------  Aortic valve area/bsa, VTI              1.56   cm^2/m^2 ----------  Velocity ratio, peak, LVOT/AV           0.83            ----------  Aortic valve area, peak                 2.61   cm^2     ----------  velocity  Aortic valve area/bsa, peak             1.31   cm^2/m^2 ----------  velocity  Velocity ratio, mean, LVOT/AV           0.97            ----------  Aortic valve area, mean                 3.04   cm^2     ----------  velocity  Aortic valve area/bsa, mean             1.52   cm^2/m^2 ----------  velocity    Aorta                                   Value           Reference  Aortic root ID, ED  31     mm       ----------  Ascending aorta ID, A-P, S              30     mm       ----------    Left atrium                             Value           Reference  LA ID, A-P, ES                          36     mm       ----------  LA ID/bsa, A-P                          1.8    cm/m^2   <=2.2  LA volume, S                            53.2   ml       ----------  LA volume/bsa, S                        26.6   ml/m^2   ----------  LA volume, ES, 1-p A4C                  48.8   ml       ----------  LA volume/bsa, ES, 1-p A4C              24.4   ml/m^2   ----------  LA volume, ES, 1-p A2C                  53.8   ml       ----------  LA volume/bsa, ES, 1-p A2C              26.9   ml/m^2   ----------    Mitral valve                            Value           Reference  Mitral E-wave peak velocity             88     cm/s     ----------  Mitral A-wave peak velocity             65.6   cm/s     ----------  Mitral deceleration time                 169    ms       150 - 230  Mitral pressure half-time               50     ms       ----------  Mitral peak gradient, D                 3      mm Hg    ----------  Mitral E/A ratio, peak                  1.3             ----------  Mitral  valve area, PHT, DP              4.78   cm^2     ----------  Mitral valve area/bsa, PHT, DP          2.39   cm^2/m^2 ----------  Mitral maximal regurg velocity,         449    cm/s     ----------  PISA  Mitral regurg VTI, PISA                 146    cm       ----------  Mitral ERO, PISA                        0.07   cm^2     ----------  Mitral regurg volume, PISA              10     ml       ----------    Pulmonary arteries                      Value           Reference  PA pressure, S, DP              (H)     31     mm Hg    <=30    Tricuspid valve                         Value           Reference  Tricuspid regurg peak velocity          262    cm/s     ----------  Tricuspid peak RV-RA gradient           27     mm Hg    ----------    Right atrium                            Value           Reference  RA ID, S-I, ES, A4C             (H)     49.5   mm       34 - 49  RA area, ES, A4C                        11.8   cm^2     8.3 - 19.5  RA volume, ES, A/L                      24     ml       ----------  RA volume/bsa, ES, A/L                  12     ml/m^2   ----------    Systemic veins                          Value           Reference  Estimated CVP                           8  mm Hg    ----------    Right ventricle                         Value           Reference  TAPSE                                   21.6   mm       ----------  RV pressure, S, DP              (H)     35     mm Hg    <=30  RV s&', lateral, S                       10.4   cm/s     ----------  Legend: (L)  and  (H)  mark values outside specified reference range.  ------------------------------------------------------------------- Prepared and Electronically  Authenticated by  Patrick Jupiter, M.D. 2019-04-21T13:10:55   Impression:  Patient has severe three-vessel coronary artery disease and presents with acute coronary syndrome and has ruled in for an acute non-ST segment elevation myocardial infarction.  Diagnostic cardiac catheterization reportedly demonstrates severe three-vessel coronary artery disease with preserved left ventricular systolic function and coronary anatomy best served with surgical revascularization.  Unfortunately, the patient's diagnostic cardiac catheterization films are not currently available for review.  I have personally reviewed the patient's transthoracic echocardiogram which demonstrates normal left ventricular systolic function.     Plan:  We tentatively plan for the patient to undergo coronary artery bypass grafting tomorrow by Dr. Tyrone Sage if the patient's diagnostic cardiac catheterization films can be located and reviewed this evening.  Otherwise plans for surgery will need to be postponed.  I have reviewed the indications, risks, and potential benefits of coronary artery bypass grafting with the patient and his wife through an interpreter.  Alternative treatment strategies have been discussed, including the relative risks, benefits and long term prognosis associated with medical therapy, percutaneous coronary intervention, and surgical revascularization.  The patient understands and accepts all potential associated risks of surgery including but not limited to risk of death, stroke or other neurologic complication, myocardial infarction, congestive heart failure, respiratory failure, renal failure, bleeding requiring blood transfusion and/or reexploration, aortic dissection or other major vascular complication, arrhythmia, heart block or bradycardia requiring permanent pacemaker, pneumonia, pleural effusion, wound infection, pulmonary embolus or other thromboembolic complication, chronic pain or other delayed  complications related to median sternotomy, or the late recurrence of symptomatic ischemic heart disease and/or congestive heart failure.  The importance of long term risk modification have been emphasized.  All questions answered.    I spent in excess of 120 minutes during the conduct of this hospital consultation and >50% of this time involved direct face-to-face encounter for counseling and/or coordination of the patient's care.    Salvatore Decent. Cornelius Moras, MD 07/22/2017 5:56 PM

## 2017-07-23 ENCOUNTER — Inpatient Hospital Stay (HOSPITAL_COMMUNITY): Payer: BLUE CROSS/BLUE SHIELD | Admitting: Anesthesiology

## 2017-07-23 ENCOUNTER — Encounter (HOSPITAL_COMMUNITY): Payer: Self-pay | Admitting: Cardiology

## 2017-07-23 ENCOUNTER — Encounter (HOSPITAL_COMMUNITY)
Admission: EM | Disposition: A | Discharge status: Home / Self Care | Payer: Self-pay | Source: Home / Self Care | Attending: Cardiothoracic Surgery

## 2017-07-23 ENCOUNTER — Inpatient Hospital Stay (HOSPITAL_COMMUNITY): Payer: BLUE CROSS/BLUE SHIELD

## 2017-07-23 DIAGNOSIS — Z951 Presence of aortocoronary bypass graft: Secondary | ICD-10-CM

## 2017-07-23 HISTORY — PX: TEE WITHOUT CARDIOVERSION: SHX5443

## 2017-07-23 HISTORY — PX: CORONARY ARTERY BYPASS GRAFT: SHX141

## 2017-07-23 LAB — GLUCOSE, CAPILLARY
GLUCOSE-CAPILLARY: 114 mg/dL — AB (ref 65–99)
GLUCOSE-CAPILLARY: 68 mg/dL (ref 65–99)
GLUCOSE-CAPILLARY: 132 mg/dL — AB (ref 65–99)
Glucose-Capillary: 102 mg/dL — ABNORMAL HIGH (ref 65–99)
Glucose-Capillary: 103 mg/dL — ABNORMAL HIGH (ref 65–99)
Glucose-Capillary: 105 mg/dL — ABNORMAL HIGH (ref 65–99)
Glucose-Capillary: 126 mg/dL — ABNORMAL HIGH (ref 65–99)
Glucose-Capillary: 130 mg/dL — ABNORMAL HIGH (ref 65–99)
Glucose-Capillary: 136 mg/dL — ABNORMAL HIGH (ref 65–99)
Glucose-Capillary: 171 mg/dL — ABNORMAL HIGH (ref 65–99)
Glucose-Capillary: 83 mg/dL (ref 65–99)

## 2017-07-23 LAB — CBC
HCT: 28.9 % — ABNORMAL LOW (ref 39.0–52.0)
HEMATOCRIT: 36.1 % — AB (ref 39.0–52.0)
HEMATOCRIT: 29.8 % — AB (ref 39.0–52.0)
HEMOGLOBIN: 12 g/dL — AB (ref 13.0–17.0)
HEMOGLOBIN: 9.9 g/dL — AB (ref 13.0–17.0)
Hemoglobin: 9.7 g/dL — ABNORMAL LOW (ref 13.0–17.0)
MCH: 31.3 pg (ref 26.0–34.0)
MCH: 30.7 pg (ref 26.0–34.0)
MCH: 31.4 pg (ref 26.0–34.0)
MCHC: 33.2 g/dL (ref 30.0–36.0)
MCHC: 33.2 g/dL (ref 30.0–36.0)
MCHC: 33.6 g/dL (ref 30.0–36.0)
MCV: 94 fL (ref 78.0–100.0)
MCV: 92.3 fL (ref 78.0–100.0)
MCV: 93.5 fL (ref 78.0–100.0)
Platelets: 160 10*3/uL (ref 150–400)
Platelets: 85 10*3/uL — ABNORMAL LOW (ref 150–400)
Platelets: 79 10*3/uL — ABNORMAL LOW (ref 150–400)
RBC: 3.84 MIL/uL — ABNORMAL LOW (ref 4.22–5.81)
RBC: 3.23 MIL/uL — AB (ref 4.22–5.81)
RBC: 3.09 MIL/uL — ABNORMAL LOW (ref 4.22–5.81)
RDW: 13.2 % (ref 11.5–15.5)
RDW: 12.8 % (ref 11.5–15.5)
RDW: 12.9 % (ref 11.5–15.5)
WBC: 8.7 10*3/uL (ref 4.0–10.5)
WBC: 7.5 10*3/uL (ref 4.0–10.5)
WBC: 6.2 10*3/uL (ref 4.0–10.5)

## 2017-07-23 LAB — POCT I-STAT, CHEM 8
BUN: 11 mg/dL (ref 6–20)
BUN: 10 mg/dL (ref 6–20)
BUN: 9 mg/dL (ref 6–20)
BUN: 10 mg/dL (ref 6–20)
BUN: 10 mg/dL (ref 6–20)
BUN: 9 mg/dL (ref 6–20)
CALCIUM ION: 1.23 mmol/L (ref 1.15–1.40)
CHLORIDE: 103 mmol/L (ref 101–111)
CHLORIDE: 102 mmol/L (ref 101–111)
CHLORIDE: 102 mmol/L (ref 101–111)
CHLORIDE: 101 mmol/L (ref 101–111)
CHLORIDE: 107 mmol/L (ref 101–111)
CREATININE: 0.7 mg/dL (ref 0.61–1.24)
CREATININE: 0.7 mg/dL (ref 0.61–1.24)
CREATININE: 0.8 mg/dL (ref 0.61–1.24)
CREATININE: 0.9 mg/dL (ref 0.61–1.24)
Calcium, Ion: 1.01 mmol/L — ABNORMAL LOW (ref 1.15–1.40)
Calcium, Ion: 1.17 mmol/L (ref 1.15–1.40)
Calcium, Ion: 1.09 mmol/L — ABNORMAL LOW (ref 1.15–1.40)
Calcium, Ion: 1.09 mmol/L — ABNORMAL LOW (ref 1.15–1.40)
Calcium, Ion: 1.08 mmol/L — ABNORMAL LOW (ref 1.15–1.40)
Chloride: 104 mmol/L (ref 101–111)
Creatinine, Ser: 0.9 mg/dL (ref 0.61–1.24)
Creatinine, Ser: 0.8 mg/dL (ref 0.61–1.24)
GLUCOSE: 147 mg/dL — AB (ref 65–99)
Glucose, Bld: 142 mg/dL — ABNORMAL HIGH (ref 65–99)
Glucose, Bld: 124 mg/dL — ABNORMAL HIGH (ref 65–99)
Glucose, Bld: 140 mg/dL — ABNORMAL HIGH (ref 65–99)
Glucose, Bld: 193 mg/dL — ABNORMAL HIGH (ref 65–99)
Glucose, Bld: 170 mg/dL — ABNORMAL HIGH (ref 65–99)
HCT: 23 % — ABNORMAL LOW (ref 39.0–52.0)
HEMATOCRIT: 31 % — AB (ref 39.0–52.0)
HEMATOCRIT: 26 % — AB (ref 39.0–52.0)
HEMATOCRIT: 29 % — AB (ref 39.0–52.0)
HEMATOCRIT: 25 % — AB (ref 39.0–52.0)
HEMATOCRIT: 25 % — AB (ref 39.0–52.0)
HEMOGLOBIN: 8.5 g/dL — AB (ref 13.0–17.0)
HEMOGLOBIN: 8.5 g/dL — AB (ref 13.0–17.0)
Hemoglobin: 10.5 g/dL — ABNORMAL LOW (ref 13.0–17.0)
Hemoglobin: 8.8 g/dL — ABNORMAL LOW (ref 13.0–17.0)
Hemoglobin: 9.9 g/dL — ABNORMAL LOW (ref 13.0–17.0)
Hemoglobin: 7.8 g/dL — ABNORMAL LOW (ref 13.0–17.0)
POTASSIUM: 3.9 mmol/L (ref 3.5–5.1)
POTASSIUM: 4.1 mmol/L (ref 3.5–5.1)
POTASSIUM: 3.8 mmol/L (ref 3.5–5.1)
POTASSIUM: 3.7 mmol/L (ref 3.5–5.1)
POTASSIUM: 4.2 mmol/L (ref 3.5–5.1)
Potassium: 3.9 mmol/L (ref 3.5–5.1)
SODIUM: 140 mmol/L (ref 135–145)
SODIUM: 140 mmol/L (ref 135–145)
Sodium: 141 mmol/L (ref 135–145)
Sodium: 138 mmol/L (ref 135–145)
Sodium: 139 mmol/L (ref 135–145)
Sodium: 141 mmol/L (ref 135–145)
TCO2: 28 mmol/L (ref 22–32)
TCO2: 25 mmol/L (ref 22–32)
TCO2: 25 mmol/L (ref 22–32)
TCO2: 25 mmol/L (ref 22–32)
TCO2: 24 mmol/L (ref 22–32)
TCO2: 21 mmol/L — ABNORMAL LOW (ref 22–32)

## 2017-07-23 LAB — POCT I-STAT 3, ART BLOOD GAS (G3+)
Acid-Base Excess: 5 mmol/L — ABNORMAL HIGH (ref 0.0–2.0)
Acid-base deficit: 4 mmol/L — ABNORMAL HIGH (ref 0.0–2.0)
Acid-base deficit: 5 mmol/L — ABNORMAL HIGH (ref 0.0–2.0)
Acid-base deficit: 7 mmol/L — ABNORMAL HIGH (ref 0.0–2.0)
BICARBONATE: 29.4 mmol/L — AB (ref 20.0–28.0)
BICARBONATE: 22.1 mmol/L (ref 20.0–28.0)
BICARBONATE: 19.8 mmol/L — AB (ref 20.0–28.0)
BICARBONATE: 17.5 mmol/L — AB (ref 20.0–28.0)
O2 SAT: 91 %
O2 Saturation: 100 %
O2 Saturation: 99 %
O2 Saturation: 99 %
PCO2 ART: 39.8 mmHg (ref 32.0–48.0)
PCO2 ART: 32.2 mmHg (ref 32.0–48.0)
PO2 ART: 324 mmHg — AB (ref 83.0–108.0)
PO2 ART: 152 mmHg — AB (ref 83.0–108.0)
PO2 ART: 151 mmHg — AB (ref 83.0–108.0)
PO2 ART: 65 mmHg — AB (ref 83.0–108.0)
TCO2: 31 mmol/L (ref 22–32)
TCO2: 23 mmol/L (ref 22–32)
TCO2: 21 mmol/L — AB (ref 22–32)
TCO2: 19 mmol/L — AB (ref 22–32)
pCO2 arterial: 42.2 mmHg (ref 32.0–48.0)
pCO2 arterial: 34 mmHg (ref 32.0–48.0)
pH, Arterial: 7.476 — ABNORMAL HIGH (ref 7.350–7.450)
pH, Arterial: 7.328 — ABNORMAL LOW (ref 7.350–7.450)
pH, Arterial: 7.372 (ref 7.350–7.450)
pH, Arterial: 7.345 — ABNORMAL LOW (ref 7.350–7.450)

## 2017-07-23 LAB — PREPARE RBC (CROSSMATCH)

## 2017-07-23 LAB — URINALYSIS, COMPLETE (UACMP) WITH MICROSCOPIC
BACTERIA UA: NONE SEEN
BILIRUBIN URINE: NEGATIVE
GLUCOSE, UA: NEGATIVE mg/dL
Hgb urine dipstick: NEGATIVE
KETONES UR: NEGATIVE mg/dL
LEUKOCYTES UA: NEGATIVE
NITRITE: NEGATIVE
PH: 6 (ref 5.0–8.0)
Protein, ur: NEGATIVE mg/dL
Specific Gravity, Urine: 1.009 (ref 1.005–1.030)
Squamous Epithelial / LPF: NONE SEEN

## 2017-07-23 LAB — BASIC METABOLIC PANEL
Anion gap: 8 (ref 5–15)
BUN: 10 mg/dL (ref 6–20)
CHLORIDE: 104 mmol/L (ref 101–111)
CO2: 24 mmol/L (ref 22–32)
Calcium: 8.7 mg/dL — ABNORMAL LOW (ref 8.9–10.3)
Creatinine, Ser: 1.05 mg/dL (ref 0.61–1.24)
GFR calc Af Amer: >60 mL/min (ref >60)
GFR calc non Af Amer: >60 mL/min (ref >60)
GLUCOSE: 140 mg/dL — AB (ref 65–99)
POTASSIUM: 3.5 mmol/L (ref 3.5–5.1)
Sodium: 136 mmol/L (ref 135–145)

## 2017-07-23 LAB — SURGICAL PCR SCREEN
MRSA, PCR: NEGATIVE
STAPHYLOCOCCUS AUREUS: NEGATIVE

## 2017-07-23 LAB — HEMOGLOBIN AND HEMATOCRIT, BLOOD
HCT: 26.1 % — ABNORMAL LOW (ref 39.0–52.0)
Hemoglobin: 9 g/dL — ABNORMAL LOW (ref 13.0–17.0)

## 2017-07-23 LAB — APTT: aPTT: 32 seconds (ref 24–36)

## 2017-07-23 LAB — HEPARIN LEVEL (UNFRACTIONATED): Heparin Unfractionated: 0.43 IU/mL (ref 0.30–0.70)

## 2017-07-23 LAB — CREATININE, SERUM
Creatinine, Ser: 0.93 mg/dL (ref 0.61–1.24)
GFR calc Af Amer: >60 mL/min (ref >60)
GFR calc non Af Amer: >60 mL/min (ref >60)

## 2017-07-23 LAB — POCT I-STAT 4, (NA,K, GLUC, HGB,HCT)
Glucose, Bld: 118 mg/dL — ABNORMAL HIGH (ref 65–99)
HEMATOCRIT: 25 % — AB (ref 39.0–52.0)
Hemoglobin: 8.5 g/dL — ABNORMAL LOW (ref 13.0–17.0)
Potassium: 3.4 mmol/L — ABNORMAL LOW (ref 3.5–5.1)
SODIUM: 141 mmol/L (ref 135–145)

## 2017-07-23 LAB — PLATELET COUNT: Platelets: 113 10*3/uL — ABNORMAL LOW (ref 150–400)

## 2017-07-23 LAB — PROTIME-INR
INR: 1.46
Prothrombin Time: 17.6 seconds — ABNORMAL HIGH (ref 11.4–15.2)

## 2017-07-23 LAB — MAGNESIUM: Magnesium: 2.8 mg/dL — ABNORMAL HIGH (ref 1.7–2.4)

## 2017-07-23 LAB — ABO/RH: ABO/RH(D): O POS

## 2017-07-23 SURGERY — CORONARY ARTERY BYPASS GRAFTING (CABG)
Anesthesia: General | Site: Chest

## 2017-07-23 MED ORDER — PHENYLEPHRINE 40 MCG/ML (10ML) SYRINGE FOR IV PUSH (FOR BLOOD PRESSURE SUPPORT)
PREFILLED_SYRINGE | INTRAVENOUS | Status: AC
  Filled 2017-07-23: qty 10

## 2017-07-23 MED ORDER — LACTATED RINGERS IV SOLN: INTRAVENOUS | Status: DC

## 2017-07-23 MED ORDER — LACTATED RINGERS IV SOLN
INTRAVENOUS | Status: DC | PRN
  Administered 2017-07-23 (×2): via INTRAVENOUS

## 2017-07-23 MED ORDER — LACTATED RINGERS IV SOLN
INTRAVENOUS | Status: DC | PRN
  Administered 2017-07-23: 07:00:00 via INTRAVENOUS

## 2017-07-23 MED ORDER — ESMOLOL HCL 100 MG/10ML IV SOLN
INTRAVENOUS | Status: AC
  Filled 2017-07-23: qty 10

## 2017-07-23 MED ORDER — MILRINONE LACTATE IN DEXTROSE 20-5 MG/100ML-% IV SOLN
0.1250 ug/kg/min | INTRAVENOUS | Status: AC
Start: 2017-07-23 — End: 2017-07-23
  Administered 2017-07-23: .3 ug/kg/min via INTRAVENOUS
  Filled 2017-07-23: qty 100

## 2017-07-23 MED ORDER — EPHEDRINE 5 MG/ML INJ
INTRAVENOUS | Status: AC
  Filled 2017-07-23: qty 10

## 2017-07-23 MED ORDER — MILRINONE LACTATE IN DEXTROSE 20-5 MG/100ML-% IV SOLN
0.2000 ug/kg/min | INTRAVENOUS | Status: DC
  Administered 2017-07-23: 0.2 ug/kg/min via INTRAVENOUS
  Filled 2017-07-23: qty 100

## 2017-07-23 MED ORDER — SODIUM CHLORIDE 0.9 % IV SOLN: 250.0000 mL | INTRAVENOUS | Status: DC

## 2017-07-23 MED ORDER — SODIUM CHLORIDE 0.9 % IV SOLN
INTRAVENOUS | Status: DC | PRN
  Administered 2017-07-23: 750 mg via INTRAVENOUS

## 2017-07-23 MED ORDER — ONDANSETRON HCL 4 MG/2ML IJ SOLN: 4.0000 mg | Freq: Four times a day (QID) | INTRAMUSCULAR | Status: DC | PRN

## 2017-07-23 MED ORDER — ACETAMINOPHEN 650 MG RE SUPP
650.0000 mg | Freq: Once | RECTAL | Status: AC
  Administered 2017-07-23: 650 mg via RECTAL

## 2017-07-23 MED ORDER — PROTAMINE SULFATE 10 MG/ML IV SOLN
INTRAVENOUS | Status: AC
  Filled 2017-07-23: qty 25

## 2017-07-23 MED ORDER — SODIUM CHLORIDE 0.9% FLUSH: 3.0000 mL | INTRAVENOUS | Status: DC | PRN

## 2017-07-23 MED ORDER — PHENYLEPHRINE 40 MCG/ML (10ML) SYRINGE FOR IV PUSH (FOR BLOOD PRESSURE SUPPORT)
PREFILLED_SYRINGE | INTRAVENOUS | Status: DC | PRN
  Administered 2017-07-23: 60 ug via INTRAVENOUS
  Administered 2017-07-23 (×2): 120 ug via INTRAVENOUS
  Administered 2017-07-23 (×2): 80 ug via INTRAVENOUS

## 2017-07-23 MED ORDER — BISACODYL 10 MG RE SUPP: 10.0000 mg | Freq: Every day | RECTAL | Status: DC

## 2017-07-23 MED ORDER — ALBUMIN HUMAN 5 % IV SOLN
INTRAVENOUS | Status: DC | PRN
  Administered 2017-07-23 (×2): via INTRAVENOUS

## 2017-07-23 MED ORDER — ASPIRIN 81 MG PO CHEW: 324.0000 mg | CHEWABLE_TABLET | Freq: Every day | ORAL | Status: DC

## 2017-07-23 MED ORDER — FENTANYL CITRATE (PF) 250 MCG/5ML IJ SOLN
INTRAMUSCULAR | Status: AC
  Filled 2017-07-23: qty 25

## 2017-07-23 MED ORDER — SODIUM CHLORIDE 0.9% FLUSH
3.0000 mL | Freq: Two times a day (BID) | INTRAVENOUS | Status: DC
  Administered 2017-07-24 (×2): 3 mL via INTRAVENOUS

## 2017-07-23 MED ORDER — ROCURONIUM BROMIDE 10 MG/ML (PF) SYRINGE
PREFILLED_SYRINGE | INTRAVENOUS | Status: DC | PRN
  Administered 2017-07-23 (×5): 50 mg via INTRAVENOUS

## 2017-07-23 MED ORDER — CHLORHEXIDINE GLUCONATE CLOTH 2 % EX PADS
6.0000 | MEDICATED_PAD | Freq: Every day | CUTANEOUS | Status: DC
  Administered 2017-07-23 – 2017-07-24 (×2): 6 via TOPICAL

## 2017-07-23 MED ORDER — ARTIFICIAL TEARS OPHTHALMIC OINT
TOPICAL_OINTMENT | OPHTHALMIC | Status: AC
  Filled 2017-07-23: qty 3.5

## 2017-07-23 MED ORDER — ESMOLOL HCL 100 MG/10ML IV SOLN
INTRAVENOUS | Status: DC | PRN
  Administered 2017-07-23 (×2): 30 mg via INTRAVENOUS
  Administered 2017-07-23: 20 mg via INTRAVENOUS

## 2017-07-23 MED ORDER — CHLORHEXIDINE GLUCONATE 0.12% ORAL RINSE (MEDLINE KIT): 15.0000 mL | Freq: Two times a day (BID) | OROMUCOSAL | Status: DC

## 2017-07-23 MED ORDER — SODIUM CHLORIDE 0.9% FLUSH
10.0000 mL | Freq: Two times a day (BID) | INTRAVENOUS | Status: DC
  Administered 2017-07-24 (×2): 10 mL

## 2017-07-23 MED ORDER — DEXTROSE 50 % IV SOLN: 13.0000 mL | Freq: Once | INTRAVENOUS | Status: AC

## 2017-07-23 MED ORDER — BISACODYL 5 MG PO TBEC
10.0000 mg | DELAYED_RELEASE_TABLET | Freq: Every day | ORAL | Status: DC
  Administered 2017-07-24: 10 mg via ORAL
  Filled 2017-07-23: qty 2

## 2017-07-23 MED ORDER — MORPHINE SULFATE (PF) 2 MG/ML IV SOLN
2.0000 mg | INTRAVENOUS | Status: DC | PRN
  Administered 2017-07-23 – 2017-07-24 (×2): 4 mg via INTRAVENOUS
  Administered 2017-07-24: 2 mg via INTRAVENOUS
  Administered 2017-07-24 (×4): 4 mg via INTRAVENOUS
  Filled 2017-07-23: qty 2
  Filled 2017-07-23: qty 1
  Filled 2017-07-23 (×5): qty 2

## 2017-07-23 MED ORDER — SODIUM CHLORIDE 0.9 % IV SOLN
INTRAVENOUS | Status: DC | PRN
  Administered 2017-07-23: 13:00:00 via INTRAVENOUS

## 2017-07-23 MED ORDER — HEPARIN SODIUM (PORCINE) 1000 UNIT/ML IJ SOLN
INTRAMUSCULAR | Status: DC | PRN
  Administered 2017-07-23: 30000 [IU] via INTRAVENOUS

## 2017-07-23 MED ORDER — ACETAMINOPHEN 160 MG/5ML PO SOLN: 1000.0000 mg | Freq: Four times a day (QID) | ORAL | Status: DC

## 2017-07-23 MED ORDER — SODIUM CHLORIDE 0.9% FLUSH: 10.0000 mL | INTRAVENOUS | Status: DC | PRN

## 2017-07-23 MED ORDER — MAGNESIUM SULFATE 4 GM/100ML IV SOLN
4.0000 g | Freq: Once | INTRAVENOUS | Status: AC
  Administered 2017-07-23: 4 g via INTRAVENOUS
  Filled 2017-07-23: qty 100

## 2017-07-23 MED ORDER — PHENYLEPHRINE HCL 10 MG/ML IJ SOLN
INTRAMUSCULAR | Status: DC | PRN
  Administered 2017-07-23: 25 ug/min via INTRAVENOUS

## 2017-07-23 MED ORDER — GELATIN ABSORBABLE MT POWD
OROMUCOSAL | Status: DC | PRN
  Administered 2017-07-23 (×3): 4 mL via TOPICAL

## 2017-07-23 MED ORDER — POTASSIUM CHLORIDE 10 MEQ/50ML IV SOLN
10.0000 meq | INTRAVENOUS | Status: AC
  Administered 2017-07-23 (×3): 10 meq via INTRAVENOUS

## 2017-07-23 MED ORDER — SUCCINYLCHOLINE CHLORIDE 200 MG/10ML IV SOSY
PREFILLED_SYRINGE | INTRAVENOUS | Status: DC | PRN
  Administered 2017-07-23: 120 mg via INTRAVENOUS

## 2017-07-23 MED ORDER — DOCUSATE SODIUM 100 MG PO CAPS
200.0000 mg | ORAL_CAPSULE | Freq: Every day | ORAL | Status: DC
  Administered 2017-07-24: 200 mg via ORAL
  Filled 2017-07-23: qty 2

## 2017-07-23 MED ORDER — MILRINONE LOAD VIA INFUSION
50.0000 ug/kg | Freq: Once | INTRAVENOUS | Status: AC
  Administered 2017-07-23: 4200 ug via INTRAVENOUS
  Filled 2017-07-23: qty 4.2

## 2017-07-23 MED ORDER — FENTANYL CITRATE (PF) 250 MCG/5ML IJ SOLN
INTRAMUSCULAR | Status: DC | PRN
  Administered 2017-07-23: 200 ug via INTRAVENOUS
  Administered 2017-07-23 (×2): 50 ug via INTRAVENOUS
  Administered 2017-07-23: 200 ug via INTRAVENOUS
  Administered 2017-07-23: 250 ug via INTRAVENOUS
  Administered 2017-07-23: 100 ug via INTRAVENOUS
  Administered 2017-07-23: 150 ug via INTRAVENOUS
  Administered 2017-07-23: 250 ug via INTRAVENOUS

## 2017-07-23 MED ORDER — PROTAMINE SULFATE 10 MG/ML IV SOLN
INTRAVENOUS | Status: AC
  Filled 2017-07-23: qty 5

## 2017-07-23 MED ORDER — FAMOTIDINE IN NACL 20-0.9 MG/50ML-% IV SOLN
20.0000 mg | Freq: Two times a day (BID) | INTRAVENOUS | Status: AC
  Administered 2017-07-23: 20 mg via INTRAVENOUS

## 2017-07-23 MED ORDER — METOPROLOL TARTRATE 5 MG/5ML IV SOLN
2.5000 mg | INTRAVENOUS | Status: DC | PRN
  Administered 2017-07-24: 2.5 mg via INTRAVENOUS
  Filled 2017-07-23: qty 5

## 2017-07-23 MED ORDER — PROPOFOL 10 MG/ML IV BOLUS
INTRAVENOUS | Status: DC | PRN
  Administered 2017-07-23: 50 mg via INTRAVENOUS
  Administered 2017-07-23: 20 mg via INTRAVENOUS

## 2017-07-23 MED ORDER — SUCCINYLCHOLINE CHLORIDE 200 MG/10ML IV SOSY
PREFILLED_SYRINGE | INTRAVENOUS | Status: AC
  Filled 2017-07-23: qty 10

## 2017-07-23 MED ORDER — METOPROLOL TARTRATE 12.5 MG HALF TABLET
12.5000 mg | ORAL_TABLET | Freq: Two times a day (BID) | ORAL | Status: DC
  Filled 2017-07-23: qty 1

## 2017-07-23 MED ORDER — ARTIFICIAL TEARS OPHTHALMIC OINT
TOPICAL_OINTMENT | OPHTHALMIC | Status: DC | PRN
  Administered 2017-07-23: 1 via OPHTHALMIC

## 2017-07-23 MED ORDER — ORAL CARE MOUTH RINSE
15.0000 mL | Freq: Two times a day (BID) | OROMUCOSAL | Status: DC
  Administered 2017-07-23 – 2017-07-29 (×11): 15 mL via OROMUCOSAL

## 2017-07-23 MED ORDER — MIDAZOLAM HCL 10 MG/2ML IJ SOLN
INTRAMUSCULAR | Status: AC
  Filled 2017-07-23: qty 2

## 2017-07-23 MED ORDER — ROCURONIUM BROMIDE 10 MG/ML (PF) SYRINGE
PREFILLED_SYRINGE | INTRAVENOUS | Status: AC
  Filled 2017-07-23: qty 5

## 2017-07-23 MED ORDER — TRAMADOL HCL 50 MG PO TABS
50.0000 mg | ORAL_TABLET | ORAL | Status: DC | PRN
  Administered 2017-07-24: 50 mg via ORAL
  Administered 2017-07-24: 100 mg via ORAL
  Filled 2017-07-23: qty 1
  Filled 2017-07-23: qty 2

## 2017-07-23 MED ORDER — ATORVASTATIN CALCIUM 80 MG PO TABS
80.0000 mg | ORAL_TABLET | Freq: Every day | ORAL | Status: DC
  Administered 2017-07-24 – 2017-07-29 (×6): 80 mg via ORAL
  Filled 2017-07-23 (×6): qty 1

## 2017-07-23 MED ORDER — ONDANSETRON HCL 4 MG/2ML IJ SOLN
INTRAMUSCULAR | Status: DC | PRN
  Administered 2017-07-23: 4 mg via INTRAVENOUS

## 2017-07-23 MED ORDER — NITROGLYCERIN IN D5W 200-5 MCG/ML-% IV SOLN: 0.0000 ug/min | INTRAVENOUS | Status: DC

## 2017-07-23 MED ORDER — MIDAZOLAM HCL 5 MG/5ML IJ SOLN
INTRAMUSCULAR | Status: DC | PRN
  Administered 2017-07-23: 1 mg via INTRAVENOUS
  Administered 2017-07-23: 5 mg via INTRAVENOUS
  Administered 2017-07-23: 3 mg via INTRAVENOUS
  Administered 2017-07-23: 1 mg via INTRAVENOUS

## 2017-07-23 MED ORDER — PROTAMINE SULFATE 10 MG/ML IV SOLN
INTRAVENOUS | Status: DC | PRN
  Administered 2017-07-23: 20 mg via INTRAVENOUS
  Administered 2017-07-23 (×2): 50 mg via INTRAVENOUS
  Administered 2017-07-23: 30 mg via INTRAVENOUS
  Administered 2017-07-23: 20 mg via INTRAVENOUS
  Administered 2017-07-23: 100 mg via INTRAVENOUS
  Administered 2017-07-23: 30 mg via INTRAVENOUS

## 2017-07-23 MED ORDER — ALBUMIN HUMAN 5 % IV SOLN
12.5000 g | Freq: Once | INTRAVENOUS | Status: AC
  Administered 2017-07-23: 17:00:00 via INTRAVENOUS

## 2017-07-23 MED ORDER — SODIUM CHLORIDE 0.9 % IV SOLN: INTRAVENOUS | Status: DC

## 2017-07-23 MED ORDER — DEXTROSE 50 % IV SOLN
INTRAVENOUS | Status: AC
  Administered 2017-07-23: 13 mL
  Filled 2017-07-23: qty 50

## 2017-07-23 MED ORDER — ORAL CARE MOUTH RINSE: 15.0000 mL | Freq: Four times a day (QID) | OROMUCOSAL | Status: DC

## 2017-07-23 MED ORDER — SODIUM CHLORIDE 0.9 % IV SOLN
1.5000 g | Freq: Two times a day (BID) | INTRAVENOUS | Status: AC
  Administered 2017-07-23 – 2017-07-25 (×4): 1.5 g via INTRAVENOUS
  Filled 2017-07-23 (×4): qty 1.5

## 2017-07-23 MED ORDER — OXYCODONE HCL 5 MG PO TABS
5.0000 mg | ORAL_TABLET | ORAL | Status: DC | PRN
  Administered 2017-07-24 (×4): 10 mg via ORAL
  Filled 2017-07-23 (×4): qty 2

## 2017-07-23 MED ORDER — ONDANSETRON HCL 4 MG/2ML IJ SOLN
INTRAMUSCULAR | Status: AC
  Filled 2017-07-23: qty 2

## 2017-07-23 MED ORDER — ACETAMINOPHEN 160 MG/5ML PO SOLN: 650.0000 mg | Freq: Once | ORAL | Status: AC

## 2017-07-23 MED ORDER — HEMOSTATIC AGENTS (NO CHARGE) OPTIME
TOPICAL | Status: DC | PRN
  Administered 2017-07-23 (×2): 1 via TOPICAL

## 2017-07-23 MED ORDER — LIDOCAINE 2% (20 MG/ML) 5 ML SYRINGE
INTRAMUSCULAR | Status: DC | PRN
  Administered 2017-07-23: 80 mg via INTRAVENOUS

## 2017-07-23 MED ORDER — MIDAZOLAM HCL 2 MG/2ML IJ SOLN: 2.0000 mg | INTRAMUSCULAR | Status: DC | PRN

## 2017-07-23 MED ORDER — METOPROLOL TARTRATE 25 MG/10 ML ORAL SUSPENSION: 12.5000 mg | Freq: Two times a day (BID) | ORAL | Status: DC

## 2017-07-23 MED ORDER — SODIUM CHLORIDE 0.9 % IV SOLN: Freq: Once | INTRAVENOUS | Status: DC

## 2017-07-23 MED ORDER — ASPIRIN EC 325 MG PO TBEC
325.0000 mg | DELAYED_RELEASE_TABLET | Freq: Every day | ORAL | Status: DC
  Administered 2017-07-24: 325 mg via ORAL
  Filled 2017-07-23: qty 1

## 2017-07-23 MED ORDER — INSULIN REGULAR BOLUS VIA INFUSION
0.0000 [IU] | Freq: Three times a day (TID) | INTRAVENOUS | Status: AC
  Administered 2017-07-23: 0 [IU] via INTRAVENOUS
  Filled 2017-07-23: qty 10

## 2017-07-23 MED ORDER — SODIUM CHLORIDE 0.9 % IV SOLN
0.0000 ug/min | INTRAVENOUS | Status: DC
  Filled 2017-07-23: qty 2

## 2017-07-23 MED ORDER — SODIUM CHLORIDE 0.9 % IV SOLN
INTRAVENOUS | Status: AC
  Filled 2017-07-23: qty 1

## 2017-07-23 MED ORDER — ALBUMIN HUMAN 5 % IV SOLN
250.0000 mL | INTRAVENOUS | Status: AC | PRN
  Administered 2017-07-23 (×2): 250 mL via INTRAVENOUS
  Filled 2017-07-23: qty 250

## 2017-07-23 MED ORDER — DEXMEDETOMIDINE HCL IN NACL 200 MCG/50ML IV SOLN
0.0000 ug/kg/h | INTRAVENOUS | Status: DC
  Administered 2017-07-23: 0.7 ug/kg/h via INTRAVENOUS
  Filled 2017-07-23: qty 50

## 2017-07-23 MED ORDER — VANCOMYCIN HCL IN DEXTROSE 1-5 GM/200ML-% IV SOLN
1000.0000 mg | Freq: Once | INTRAVENOUS | Status: AC
  Administered 2017-07-23: 1000 mg via INTRAVENOUS
  Filled 2017-07-23: qty 200

## 2017-07-23 MED ORDER — CHLORHEXIDINE GLUCONATE 0.12 % MT SOLN
15.0000 mL | OROMUCOSAL | Status: AC
  Administered 2017-07-23: 15 mL via OROMUCOSAL

## 2017-07-23 MED ORDER — LACTATED RINGERS IV SOLN: 500.0000 mL | Freq: Once | INTRAVENOUS | Status: DC | PRN

## 2017-07-23 MED ORDER — SODIUM CHLORIDE 0.45 % IV SOLN: INTRAVENOUS | Status: DC | PRN

## 2017-07-23 MED ORDER — PROPOFOL 10 MG/ML IV BOLUS
INTRAVENOUS | Status: AC
  Filled 2017-07-23: qty 20

## 2017-07-23 MED ORDER — LIDOCAINE 2% (20 MG/ML) 5 ML SYRINGE
INTRAMUSCULAR | Status: AC
  Filled 2017-07-23: qty 5

## 2017-07-23 MED ORDER — HEPARIN SODIUM (PORCINE) 1000 UNIT/ML IJ SOLN
INTRAMUSCULAR | Status: AC
  Filled 2017-07-23: qty 1

## 2017-07-23 MED ORDER — PANTOPRAZOLE SODIUM 40 MG PO TBEC: 40.0000 mg | DELAYED_RELEASE_TABLET | Freq: Every day | ORAL | Status: DC

## 2017-07-23 MED ORDER — ACETAMINOPHEN 500 MG PO TABS
1000.0000 mg | ORAL_TABLET | Freq: Four times a day (QID) | ORAL | Status: DC
  Administered 2017-07-23 – 2017-07-25 (×6): 1000 mg via ORAL
  Filled 2017-07-23 (×6): qty 2

## 2017-07-23 MED ORDER — MORPHINE SULFATE (PF) 2 MG/ML IV SOLN
1.0000 mg | INTRAVENOUS | Status: DC | PRN
  Administered 2017-07-23 (×2): 1 mg via INTRAVENOUS
  Filled 2017-07-23: qty 1

## 2017-07-23 MED FILL — Heparin Sod (Porcine)-NaCl IV Soln 1000 Unit/500ML-0.9%: INTRAVENOUS | Qty: 1000 | Status: AC

## 2017-07-23 MED FILL — Nitroglycerin IV Soln 100 MCG/ML in D5W: INTRA_ARTERIAL | Qty: 10 | Status: AC

## 2017-07-23 SURGICAL SUPPLY — 66 items
BAG DECANTER FOR FLEXI CONT (MISCELLANEOUS) ×3 IMPLANT
BANDAGE ACE 4X5 VEL STRL LF (GAUZE/BANDAGES/DRESSINGS) ×3 IMPLANT
BANDAGE ACE 6X5 VEL STRL LF (GAUZE/BANDAGES/DRESSINGS) ×3 IMPLANT
BLADE NDL 3 SS STRL (BLADE) IMPLANT
BLADE NEEDLE 3 SS STRL (BLADE) ×3 IMPLANT
BLADE STERNUM SYSTEM 6 (BLADE) ×3 IMPLANT
BNDG GAUZE ELAST 4 BULKY (GAUZE/BANDAGES/DRESSINGS) ×3 IMPLANT
CANISTER SUCT 3000ML PPV (MISCELLANEOUS) ×3 IMPLANT
CATH CPB KIT GERHARDT (MISCELLANEOUS) ×3 IMPLANT
CATH THORACIC 28FR (CATHETERS) ×3 IMPLANT
CRADLE DONUT ADULT HEAD (MISCELLANEOUS) ×3 IMPLANT
DRAIN CHANNEL 28F RND 3/8 FF (WOUND CARE) ×3 IMPLANT
DRAPE CARDIOVASCULAR INCISE (DRAPES) ×3
DRAPE SLUSH/WARMER DISC (DRAPES) ×3 IMPLANT
DRAPE SRG 135X102X78XABS (DRAPES) ×2 IMPLANT
DRSG AQUACEL AG ADV 3.5X14 (GAUZE/BANDAGES/DRESSINGS) ×3 IMPLANT
ELECT BLADE 4.0 EZ CLEAN MEGAD (MISCELLANEOUS) ×3
ELECT REM PT RETURN 9FT ADLT (ELECTROSURGICAL) ×6
ELECTRODE BLDE 4.0 EZ CLN MEGD (MISCELLANEOUS) ×2 IMPLANT
ELECTRODE REM PT RTRN 9FT ADLT (ELECTROSURGICAL) ×4 IMPLANT
FELT TEFLON 1X6 (MISCELLANEOUS) ×6 IMPLANT
GAUZE SPONGE 4X4 12PLY STRL (GAUZE/BANDAGES/DRESSINGS) ×6 IMPLANT
GLOVE BIO SURGEON STRL SZ 6.5 (GLOVE) ×13 IMPLANT
GLOVE BIOGEL PI IND STRL 6 (GLOVE) IMPLANT
GLOVE BIOGEL PI INDICATOR 6 (GLOVE) ×2
GOWN STRL REUS W/ TWL LRG LVL3 (GOWN DISPOSABLE) ×8 IMPLANT
GOWN STRL REUS W/TWL LRG LVL3 (GOWN DISPOSABLE) ×24
HEMOSTAT POWDER SURGIFOAM 1G (HEMOSTASIS) ×9 IMPLANT
HEMOSTAT SURGICEL 2X14 (HEMOSTASIS) ×3 IMPLANT
KIT BASIN OR (CUSTOM PROCEDURE TRAY) ×3 IMPLANT
KIT CATH SUCT 8FR (CATHETERS) ×3 IMPLANT
KIT SUCTION CATH 14FR (SUCTIONS) ×6 IMPLANT
KIT TURNOVER KIT B (KITS) ×3 IMPLANT
KIT VASOVIEW HEMOPRO VH 3000 (KITS) ×3 IMPLANT
LEAD PACING MYOCARDI (MISCELLANEOUS) ×3 IMPLANT
MARKER GRAFT CORONARY BYPASS (MISCELLANEOUS) ×9 IMPLANT
NS IRRIG 1000ML POUR BTL (IV SOLUTION) ×16 IMPLANT
PACK E OPEN HEART (SUTURE) ×3 IMPLANT
PACK OPEN HEART (CUSTOM PROCEDURE TRAY) ×3 IMPLANT
PAD ARMBOARD 7.5X6 YLW CONV (MISCELLANEOUS) ×6 IMPLANT
PAD ELECT DEFIB RADIOL ZOLL (MISCELLANEOUS) ×3 IMPLANT
PENCIL BUTTON HOLSTER BLD 10FT (ELECTRODE) ×3 IMPLANT
PUNCH AORTIC ROTATE  4.5MM 8IN (MISCELLANEOUS) ×1 IMPLANT
SET CARDIOPLEGIA MPS 5001102 (MISCELLANEOUS) ×1 IMPLANT
SOLUTION ANTI FOG 6CC (MISCELLANEOUS) ×1 IMPLANT
SUT BONE WAX W31G (SUTURE) ×3 IMPLANT
SUT MNCRL AB 4-0 PS2 18 (SUTURE) ×1 IMPLANT
SUT PROLENE 3 0 SH1 36 (SUTURE) ×3 IMPLANT
SUT PROLENE 4 0 TF (SUTURE) ×6 IMPLANT
SUT PROLENE 6 0 CC (SUTURE) ×8 IMPLANT
SUT PROLENE 7 0 BV1 MDA (SUTURE) ×4 IMPLANT
SUT PROLENE 8 0 BV175 6 (SUTURE) ×8 IMPLANT
SUT STEEL 6MS V (SUTURE) ×3 IMPLANT
SUT STEEL SZ 6 DBL 3X14 BALL (SUTURE) ×3 IMPLANT
SUT VIC AB 1 CTX 18 (SUTURE) ×6 IMPLANT
SUT VIC AB 2-0 CT1 27 (SUTURE) ×3
SUT VIC AB 2-0 CT1 TAPERPNT 27 (SUTURE) IMPLANT
SYSTEM SAHARA CHEST DRAIN ATS (WOUND CARE) ×3 IMPLANT
TAPE CLOTH SURG 4X10 WHT LF (GAUZE/BANDAGES/DRESSINGS) ×1 IMPLANT
TAPE PAPER 2X10 WHT MICROPORE (GAUZE/BANDAGES/DRESSINGS) ×1 IMPLANT
TOWEL GREEN STERILE (TOWEL DISPOSABLE) ×3 IMPLANT
TOWEL GREEN STERILE FF (TOWEL DISPOSABLE) ×3 IMPLANT
TRAY FOLEY SILVER 16FR TEMP (SET/KITS/TRAYS/PACK) ×3 IMPLANT
TUBING INSUFFLATION (TUBING) ×3 IMPLANT
UNDERPAD 30X30 (UNDERPADS AND DIAPERS) ×3 IMPLANT
WATER STERILE IRR 1000ML POUR (IV SOLUTION) ×6 IMPLANT

## 2017-07-23 NOTE — Anesthesia Procedure Notes (Signed)
Arterial Line Insertion Start/End4/22/2019 7:00 AM, 07/23/2017 7:10 AM Performed by: Nils PyleBell, Makyi Ledo T, CRNA, CRNA  Patient location: Pre-op. Preanesthetic checklist: patient identified, IV checked, site marked, risks and benefits discussed, surgical consent, monitors and equipment checked, pre-op evaluation and anesthesia consent Lidocaine 1% used for infiltration and patient sedated Left, radial was placed Catheter size: 20 G Hand hygiene performed  and maximum sterile barriers used   Attempts: 1 Procedure performed without using ultrasound guided technique. Ultrasound Notes:anatomy identified, needle tip was noted to be adjacent to the nerve/plexus identified and no ultrasound evidence of intravascular and/or intraneural injection Following insertion, dressing applied and Biopatch. Post procedure assessment: normal  Patient tolerated the procedure well with no immediate complications.

## 2017-07-23 NOTE — Progress Notes (Signed)
Patient ID: Kenneth HoneyRefugio Annett, male   DOB: 06/16/1960, 57 y.o.   MRN: [id number] EVENING ROUNDS NOTE :     [address]       [name] [id number]             [phone]                 Day of Surgery Procedure(s) (LRB): CORONARY ARTERY BYPASS GRAFTING (CABG) x Five , using left internal mammary artery and right leg greater saphenous vein harvested endoscopically (N/A) TRANSESOPHAGEAL ECHOCARDIOGRAM (TEE) (N/A)  Total Length of Stay:  LOS: 2 days  BP (!) 109/56   Pulse 98   Temp 99.5 F (37.5 C)   Resp (!) 22   Ht 5\' 6"  (1.676 m)   Wt 184 lb 4.9 oz (83.6 kg)   SpO2 100%   BMI 29.75 kg/m   .Intake/Output      04/21 0701 - 04/22 0700 04/22 0701 - 04/23 0700   P.O. 240    I.V. (mL/kg) 227.1 (2.7) 2657.5 (31.8)   Blood  500   IV Piggyback  1300   Total Intake(mL/kg) 467.1 (5.6) 4457.5 (53.3)   Urine (mL/kg/hr) 1430 (0.7) 1850 (1.9)   Blood  1000   Chest Tube  50   Total Output 1430 2900   Net -962.9 +1557.5          . sodium chloride    . [START ON 07/24/2017] sodium chloride    . sodium chloride    . albumin human    . cefUROXime (ZINACEF)  IV 1.5 g (07/23/17 1824)  . dexmedetomidine (PRECEDEX) IV infusion 0.4 mcg/kg/hr (07/23/17 1830)  . famotidine (PEPCID) IV Stopped (07/23/17 1430)  . insulin (NOVOLIN-R) infusion 2.1 Units/hr (07/23/17 1800)  . lactated ringers    . lactated ringers    . lactated ringers 10 mL/hr at 07/23/17 1500  . magnesium sulfate 4 g (07/23/17 1436)  . milrinone 0.2 mcg/kg/min (07/23/17 1726)  . nitroGLYCERIN Stopped (07/23/17 1400)  . phenylephrine (NEO-SYNEPHRINE) Adult infusion Stopped (07/23/17 1400)  . vancomycin       Lab Results  Component Value Date   WBC 7.5 07/23/2017   HGB 9.9 (L) 07/23/2017   HCT 29.8 (L) 07/23/2017   PLT 85 (L) 07/23/2017   GLUCOSE 170 (H) 07/23/2017   CHOL 103 07/22/2017   TRIG 60 07/22/2017   HDL 30 (L) 07/22/2017   LDLCALC 61 07/22/2017   ALT 33 07/21/2017   AST 89 (H) 07/21/2017   NA 139 07/23/2017   K 3.7 07/23/2017   CL 101 07/23/2017   CREATININE 0.80 07/23/2017   BUN 10 07/23/2017   CO2 24 07/23/2017   TSH 1.395 07/21/2017   INR 1.46 07/23/2017   HGBA1C 6.7 (H) 07/21/2017   Stable post op not bleeding Weaning vent  Delight OvensEdward B Atisha Hamidi MD  Beeper [phone] Office [phone] 07/23/2017 6:53 PM

## 2017-07-23 NOTE — Progress Notes (Signed)
Rapid weaning protocol 

## 2017-07-23 NOTE — Anesthesia Procedure Notes (Signed)
Central Venous Catheter Insertion Performed by: Marcene DuosFitzgerald, Philis Doke, MD, anesthesiologist Start/End4/22/2019 6:50 AM, 07/23/2017 7:00 AM Patient location: Pre-op. Preanesthetic checklist: patient identified, IV checked, site marked, risks and benefits discussed, surgical consent, monitors and equipment checked, pre-op evaluation, timeout performed and anesthesia consent Hand hygiene performed  and maximum sterile barriers used  PA cath was placed.Swan type:thermodilution Procedure performed without using ultrasound guided technique. Attempts: 1 Patient tolerated the procedure well with no immediate complications.

## 2017-07-23 NOTE — Brief Op Note (Addendum)
      [address]       [name] [id number]             [phone]       07/23/2017  1:03 PM  PATIENT:  [name]  57 y.o. male  PRE-OPERATIVE DIAGNOSIS:  coronary artery disease  POST-OPERATIVE DIAGNOSIS:  coronary artery disease  PROCEDURE:  Procedure(s): CORONARY ARTERY BYPASS GRAFTING (CABG) x Five , using left internal mammary artery and right leg greater saphenous vein harvested endoscopically (N/A) TRANSESOPHAGEAL ECHOCARDIOGRAM (TEE) (N/A)  LIMA to LAD SVG to distal RCA SVG to OM1 and OM2 SVG to Diag1  SURGEON:  Surgeon(s) and Role:    * Delight OvensGerhardt, Lucinda Spells B, MD - Primary  PHYSICIAN ASSISTANT:  Jari Favreessa Conte, PA-C   ANESTHESIA:   general  EBL:  1000 mL   BLOOD ADMINISTERED:none  DRAINS: ROUTINE   LOCAL MEDICATIONS USED:  NONE  SPECIMEN:  No Specimen  DISPOSITION OF SPECIMEN:  N/A  COUNTS:  YES  DICTATION: .Dragon Dictation  PLAN OF CARE: Admit to inpatient   PATIENT DISPOSITION:  ICU - intubated and hemodynamically stable.   Delay start of Pharmacological VTE agent (>24hrs) due to surgical blood loss or risk of bleeding: yes

## 2017-07-23 NOTE — Progress Notes (Signed)
  Echocardiogram 2D Echocardiogram has been performed.  Roosvelt MaserLane, Allin Frix F 07/23/2017, 9:53 AM

## 2017-07-23 NOTE — OR Nursing (Signed)
13:40- 20 minute call to SICU charge nurse

## 2017-07-23 NOTE — Anesthesia Procedure Notes (Signed)
Procedure Name: Intubation Date/Time: 07/23/2017 7:51 AM Performed by: Nils PyleBell, Dyane Broberg T, CRNA Pre-anesthesia Checklist: Patient identified, Emergency Drugs available, Suction available and Patient being monitored Patient Re-evaluated:Patient Re-evaluated prior to induction Oxygen Delivery Method: Circle System Utilized Preoxygenation: Pre-oxygenation with 100% oxygen Induction Type: IV induction and Rapid sequence Ventilation: Mask ventilation without difficulty and Oral airway inserted - appropriate to patient size Laryngoscope Size: Hyacinth MeekerMiller and 2 Grade View: Grade II Tube type: Oral Tube size: 8.0 mm Number of attempts: 1 Airway Equipment and Method: Stylet and Oral airway Placement Confirmation: ETT inserted through vocal cords under direct vision,  positive ETCO2 and breath sounds checked- equal and bilateral Secured at: 23 cm Tube secured with: Tape Dental Injury: Teeth and Oropharynx as per pre-operative assessment

## 2017-07-23 NOTE — Anesthesia Procedure Notes (Signed)
Central Venous Catheter Insertion Performed by: Marcene DuosFitzgerald, Delawrence Fridman, MD, anesthesiologist Start/End4/22/2019 6:50 AM, 07/23/2017 7:00 AM Patient location: Pre-op. Preanesthetic checklist: patient identified, IV checked, site marked, risks and benefits discussed, surgical consent, monitors and equipment checked, pre-op evaluation, timeout performed and anesthesia consent Position: Trendelenburg Lidocaine 1% used for infiltration and patient sedated Hand hygiene performed , maximum sterile barriers used  and Seldinger technique used Catheter size: 8.5 Fr Total catheter length 10. Central line and PA cath was placed.Sheath introducer Swan type:thermodilution PA Cath depth:50 Procedure performed using ultrasound guided technique. Ultrasound Notes:anatomy identified, needle tip was noted to be adjacent to the nerve/plexus identified, no ultrasound evidence of intravascular and/or intraneural injection and image(s) printed for medical record Attempts: 1 Following insertion, line sutured, dressing applied and Biopatch. Post procedure assessment: blood return through all ports, free fluid flow and no air  Patient tolerated the procedure well with no immediate complications.

## 2017-07-23 NOTE — Progress Notes (Signed)
Patient ID: [name], male   DOB: 12/29/1960, 57 y.o.   MRN: [id number]      [address]       [name] [id number]             [phone]     Pre Procedure note for inpatients:   [name] has been scheduled for Procedure(s): CORONARY ARTERY BYPASS GRAFTING (CABG) (N/A) TRANSESOPHAGEAL ECHOCARDIOGRAM (TEE) (N/A) today. The various methods of treatment have been discussed with the patient. After consideration of the risks, benefits and treatment options the patient has consented to the planned procedure.   The patient has been seen and labs reviewed. There are no changes in the patient's condition to prevent proceeding with the planned procedure today.  Recent labs:  Lab Results  Component Value Date   WBC 8.7 07/23/2017   HGB 12.0 (L) 07/23/2017   HCT 36.1 (L) 07/23/2017   PLT 160 07/23/2017   GLUCOSE 140 (H) 07/23/2017   CHOL 103 07/22/2017   TRIG 60 07/22/2017   HDL 30 (L) 07/22/2017   LDLCALC 61 07/22/2017   ALT 33 07/21/2017   AST 89 (H) 07/21/2017   NA 136 07/23/2017   K 3.5 07/23/2017   CL 104 07/23/2017   CREATININE 1.05 07/23/2017   BUN 10 07/23/2017   CO2 24 07/23/2017   TSH 1.395 07/21/2017   INR 1.10 07/22/2017   HGBA1C 6.7 (H) 07/21/2017    Delight OvensEdward B Vilda Zollner, MD 07/23/2017 7:16 AM

## 2017-07-23 NOTE — Transfer of Care (Signed)
Immediate Anesthesia Transfer of Care Note  Patient: [name]  Procedure(s) Performed: CORONARY ARTERY BYPASS GRAFTING (CABG) x Five , using left internal mammary artery and right leg greater saphenous vein harvested endoscopically (N/A Chest) TRANSESOPHAGEAL ECHOCARDIOGRAM (TEE) (N/A )  Patient Location: SICU  Anesthesia Type:General  Level of Consciousness: sedated, unresponsive and Patient remains intubated per anesthesia plan  Airway & Oxygen Therapy: Patient remains intubated per anesthesia plan and Patient placed on Ventilator (see vital sign flow sheet for setting)  Post-op Assessment: Report given to RN and Post -op Vital signs reviewed and stable  Post vital signs: Reviewed and stable  Last Vitals:  Vitals Value Taken Time  BP    Temp    Pulse    Resp 12 07/23/2017  2:06 PM  SpO2    Vitals shown include unvalidated device data.  Last Pain:  Vitals:   07/23/17 0400  TempSrc:   PainSc: 0-No pain         Complications: No apparent anesthesia complications

## 2017-07-23 NOTE — Progress Notes (Signed)
Swan core temp reading high, 38.6 C; checked an oral temp and received 36.7 C. Will continue to monitor.  Herma ArdMOSELEY, Kaedyn Belardo F, RN

## 2017-07-23 NOTE — Procedures (Signed)
Extubation Procedure Note  Cardiac Rapid Wean completed without complication. Pt's daughter assisted with translation throughout parameters and extubation. Pt follows all commands. VC 750 ml's, NIF -36, + cuff leak. No Stridor after extubation. Pt extubated to 4L [place]. Pt speaks name.   Patient Details:   Name: [name] DOB: 09/09/1960 MRN: [id number]   Airway Documentation:    Vent end date: 07/23/17 Vent end time: 1940   Evaluation  O2 sats: stable throughout Complications: No apparent complications Patient did tolerate procedure well. Bilateral Breath Sounds: Clear   Yes  Lianne BushyDaniel, Laria Grimmett C 07/23/2017, 7:47 PM

## 2017-07-24 ENCOUNTER — Inpatient Hospital Stay (HOSPITAL_COMMUNITY): Payer: BLUE CROSS/BLUE SHIELD

## 2017-07-24 ENCOUNTER — Encounter (HOSPITAL_COMMUNITY): Payer: Self-pay | Admitting: Cardiothoracic Surgery

## 2017-07-24 LAB — GLUCOSE, CAPILLARY
GLUCOSE-CAPILLARY: 102 mg/dL — AB (ref 65–99)
GLUCOSE-CAPILLARY: 103 mg/dL — AB (ref 65–99)
GLUCOSE-CAPILLARY: 104 mg/dL — AB (ref 65–99)
GLUCOSE-CAPILLARY: 108 mg/dL — AB (ref 65–99)
GLUCOSE-CAPILLARY: 112 mg/dL — AB (ref 65–99)
GLUCOSE-CAPILLARY: 114 mg/dL — AB (ref 65–99)
GLUCOSE-CAPILLARY: 128 mg/dL — AB (ref 65–99)
GLUCOSE-CAPILLARY: 128 mg/dL — AB (ref 65–99)
GLUCOSE-CAPILLARY: 131 mg/dL — AB (ref 65–99)
GLUCOSE-CAPILLARY: 164 mg/dL — AB (ref 65–99)
GLUCOSE-CAPILLARY: 81 mg/dL (ref 65–99)
Glucose-Capillary: 123 mg/dL — ABNORMAL HIGH (ref 65–99)
Glucose-Capillary: 60 mg/dL — ABNORMAL LOW (ref 65–99)
Glucose-Capillary: 120 mg/dL — ABNORMAL HIGH (ref 65–99)
Glucose-Capillary: 121 mg/dL — ABNORMAL HIGH (ref 65–99)
Glucose-Capillary: 138 mg/dL — ABNORMAL HIGH (ref 65–99)
Glucose-Capillary: 185 mg/dL — ABNORMAL HIGH (ref 65–99)

## 2017-07-24 LAB — ECHO INTRAOPERATIVE TEE
HEIGHTINCHES: 66 in
WEIGHTICAEL: 2948.87 [oz_av]

## 2017-07-24 LAB — BASIC METABOLIC PANEL
Anion gap: 8 (ref 5–15)
BUN: 8 mg/dL (ref 6–20)
CO2: 20 mmol/L — ABNORMAL LOW (ref 22–32)
Calcium: 7.5 mg/dL — ABNORMAL LOW (ref 8.9–10.3)
Chloride: 109 mmol/L (ref 101–111)
Creatinine, Ser: 0.97 mg/dL (ref 0.61–1.24)
GFR calc Af Amer: >60 mL/min (ref >60)
GLUCOSE: 108 mg/dL — AB (ref 65–99)
Potassium: 3.7 mmol/L (ref 3.5–5.1)
Sodium: 137 mmol/L (ref 135–145)

## 2017-07-24 LAB — CBC
HCT: 28.4 % — ABNORMAL LOW (ref 39.0–52.0)
HCT: 32.7 % — ABNORMAL LOW (ref 39.0–52.0)
Hemoglobin: 9.4 g/dL — ABNORMAL LOW (ref 13.0–17.0)
Hemoglobin: 10.6 g/dL — ABNORMAL LOW (ref 13.0–17.0)
MCH: 31.1 pg (ref 26.0–34.0)
MCH: 31.3 pg (ref 26.0–34.0)
MCHC: 33.1 g/dL (ref 30.0–36.0)
MCHC: 32.4 g/dL (ref 30.0–36.0)
MCV: 94 fL (ref 78.0–100.0)
MCV: 96.5 fL (ref 78.0–100.0)
Platelets: 89 10*3/uL — ABNORMAL LOW (ref 150–400)
Platelets: 117 10*3/uL — ABNORMAL LOW (ref 150–400)
RBC: 3.02 MIL/uL — ABNORMAL LOW (ref 4.22–5.81)
RBC: 3.39 MIL/uL — ABNORMAL LOW (ref 4.22–5.81)
RDW: 13 % (ref 11.5–15.5)
RDW: 13.4 % (ref 11.5–15.5)
WBC: 7.5 10*3/uL (ref 4.0–10.5)
WBC: 10.2 10*3/uL (ref 4.0–10.5)

## 2017-07-24 LAB — POCT I-STAT, CHEM 8
BUN: 10 mg/dL (ref 6–20)
CREATININE: 1 mg/dL (ref 0.61–1.24)
Calcium, Ion: 1.11 mmol/L — ABNORMAL LOW (ref 1.15–1.40)
Chloride: 101 mmol/L (ref 101–111)
GLUCOSE: 161 mg/dL — AB (ref 65–99)
HCT: 32 % — ABNORMAL LOW (ref 39.0–52.0)
HEMOGLOBIN: 10.9 g/dL — AB (ref 13.0–17.0)
Potassium: 4.3 mmol/L (ref 3.5–5.1)
Sodium: 135 mmol/L (ref 135–145)
TCO2: 23 mmol/L (ref 22–32)

## 2017-07-24 LAB — MAGNESIUM
Magnesium: 2.3 mg/dL (ref 1.7–2.4)
Magnesium: 2.3 mg/dL (ref 1.7–2.4)

## 2017-07-24 LAB — HEMOGLOBIN AND HEMATOCRIT, BLOOD
HCT: 30.8 % — ABNORMAL LOW (ref 39.0–52.0)
Hemoglobin: 10.1 g/dL — ABNORMAL LOW (ref 13.0–17.0)

## 2017-07-24 LAB — CREATININE, SERUM
Creatinine, Ser: 1.13 mg/dL (ref 0.61–1.24)
GFR calc non Af Amer: >60 mL/min (ref >60)

## 2017-07-24 MED ORDER — INSULIN DETEMIR 100 UNIT/ML ~~LOC~~ SOLN
15.0000 [IU] | Freq: Every day | SUBCUTANEOUS | Status: DC
  Filled 2017-07-24: qty 0.15

## 2017-07-24 MED ORDER — METOPROLOL TARTRATE 25 MG/10 ML ORAL SUSPENSION: 12.5000 mg | Freq: Three times a day (TID) | ORAL | Status: DC

## 2017-07-24 MED ORDER — POTASSIUM CHLORIDE 10 MEQ/50ML IV SOLN
10.0000 meq | INTRAVENOUS | Status: AC
  Administered 2017-07-24 (×3): 10 meq via INTRAVENOUS
  Filled 2017-07-24 (×3): qty 50

## 2017-07-24 MED ORDER — INSULIN DETEMIR 100 UNIT/ML ~~LOC~~ SOLN
15.0000 [IU] | Freq: Once | SUBCUTANEOUS | Status: AC
  Administered 2017-07-24: 15 [IU] via SUBCUTANEOUS
  Filled 2017-07-24: qty 0.15

## 2017-07-24 MED ORDER — INSULIN ASPART 100 UNIT/ML ~~LOC~~ SOLN
0.0000 [IU] | SUBCUTANEOUS | Status: DC
  Administered 2017-07-24 (×2): 4 [IU] via SUBCUTANEOUS
  Administered 2017-07-25 (×2): 2 [IU] via SUBCUTANEOUS

## 2017-07-24 MED ORDER — METOPROLOL TARTRATE 12.5 MG HALF TABLET
12.5000 mg | ORAL_TABLET | Freq: Three times a day (TID) | ORAL | Status: DC
  Administered 2017-07-24 (×2): 12.5 mg via ORAL
  Filled 2017-07-24 (×2): qty 1

## 2017-07-24 MED FILL — Heparin Sodium (Porcine) Inj 1000 Unit/ML: INTRAMUSCULAR | Qty: 2500 | Status: AC

## 2017-07-24 MED FILL — Potassium Chloride Inj 2 mEq/ML: INTRAVENOUS | Qty: 40 | Status: AC

## 2017-07-24 MED FILL — Heparin Sodium (Porcine) Inj 1000 Unit/ML: INTRAMUSCULAR | Qty: 30 | Status: AC

## 2017-07-24 MED FILL — Magnesium Sulfate Inj 50%: INTRAMUSCULAR | Qty: 10 | Status: AC

## 2017-07-24 NOTE — Care Management Note (Signed)
Case Management Note Donn PieriniKristi Hatsumi Steinhart RN, BSN Unit 4E-Case Manager-- 2H coverage [phone]  Patient Details  Name: Alain HoneyRefugio Gibbs MRN: [id number] Date of Birth: 12/19/1960  Subjective/Objective: Pt admitted with NSTEMI- s/p CABGx5 on 07/23/17                 Action/Plan: PTA Pt lived at home with spouse- anticipate return home- CM to follow for transition of care needs  Expected Discharge Date:                  Expected Discharge Plan:     In-House Referral:     Discharge planning Services  CM Consult  Post Acute Care Choice:    Choice offered to:     DME Arranged:    DME Agency:     HH Arranged:    HH Agency:     Status of Service:  In process, will continue to follow  If discussed at Long Length of Stay Meetings, dates discussed:    Discharge Disposition:   Additional Comments:  Darrold SpanWebster, Tylique Aull Hall, RN 07/24/2017, 12:18 PM

## 2017-07-24 NOTE — Anesthesia Postprocedure Evaluation (Signed)
Anesthesia Post Note  Patient: [name]  Procedure(s) Performed: CORONARY ARTERY BYPASS GRAFTING (CABG) x Five , using left internal mammary artery and right leg greater saphenous vein harvested endoscopically (N/A Chest) TRANSESOPHAGEAL ECHOCARDIOGRAM (TEE) (N/A )     Patient location during evaluation: SICU Anesthesia Type: General Level of consciousness: sedated Pain management: pain level controlled Vital Signs Assessment: post-procedure vital signs reviewed and stable Respiratory status: patient remains intubated per anesthesia plan Cardiovascular status: stable Postop Assessment: no apparent nausea or vomiting Anesthetic complications: no    Last Vitals:  Vitals:   07/24/17 0600 07/24/17 0700  BP: (!) 152/82 132/79  Pulse: 100 95  Resp: (!) 32 (!) 22  Temp: 37.9 C 37.4 C  SpO2: 99% 100%    Last Pain:  Vitals:   07/24/17 0628  TempSrc:   PainSc: 2                  Kennieth RadFitzgerald, Takuma Cifelli E

## 2017-07-24 NOTE — Progress Notes (Signed)
Patient ID: Kenneth HoneyRefugio Carlson, male   DOB: 11/27/1960, 57 y.o.   MRN: [id number] TCTS DAILY ICU PROGRESS NOTE                   [address]            [name] [id number]          [phone]   1 Day Post-Op Procedure(s) (LRB): CORONARY ARTERY BYPASS GRAFTING (CABG) x Five , using left internal mammary artery and right leg greater saphenous vein harvested endoscopically (N/A) TRANSESOPHAGEAL ECHOCARDIOGRAM (TEE) (N/A)  Total Length of Stay:  LOS: 3 days   Subjective: Patient awake alert extubated, says he is comfortable   Objective: Vital signs in last 24 hours: Temp:  [98 F (36.7 C)-101.7 F (38.7 C)] 99.3 F (37.4 C) (04/23 0700) Pulse Rate:  [89-107] 95 (04/23 0700) Cardiac Rhythm: Normal sinus rhythm (04/23 0400) Resp:  [12-32] 22 (04/23 0700) BP: (96-152)/(56-90) 132/79 (04/23 0700) SpO2:  [96 %-100 %] 100 % (04/23 0700) Arterial Line BP: (95-251)/(49-244) 117/55 (04/23 0700) FiO2 (%):  [40 %-50 %] 40 % (04/22 1900) Weight:  [202 lb 9.6 oz (91.9 kg)] 202 lb 9.6 oz (91.9 kg) (04/23 0500)  Filed Weights   07/21/17 1807 07/22/17 0330 07/24/17 0500  Weight: 193 lb (87.5 kg) 184 lb 4.9 oz (83.6 kg) 202 lb 9.6 oz (91.9 kg)    Weight change:    Hemodynamic parameters for last 24 hours: PAP: (18-49)/(8-20) 33/11 CO:  [2.6 L/min-8.6 L/min] 6.8 L/min CI:  [3.2 L/min/m2-4.5 L/min/m2] 3.5 L/min/m2  Intake/Output from previous day: 04/22 0701 - 04/23 0700 In: 5402.7 [I.V.:3052.7; Blood:500; IV Piggyback:1850] Out: 3706 [Urine:2376; Blood:1000; Chest Tube:330]  Intake/Output this shift: No intake/output data recorded.  Current Meds: Scheduled Meds: . acetaminophen  1,000 mg Oral Q6H   Or  . acetaminophen (TYLENOL) oral liquid 160 mg/5 mL  1,000 mg Per Tube Q6H  . aspirin EC  325 mg Oral Daily   Or  . aspirin  324 mg Per Tube Daily  . atorvastatin  80 mg Oral q1800  . bisacodyl  10 mg Oral Daily   Or  . bisacodyl  10 mg Rectal Daily  . Chlorhexidine  Gluconate Cloth  6 each Topical Daily  . docusate sodium  200 mg Oral Daily  . insulin regular  0-10 Units Intravenous TID WC  . mouth rinse  15 mL Mouth Rinse BID  . metoprolol tartrate  12.5 mg Oral BID   Or  . metoprolol tartrate  12.5 mg Per Tube BID  . [START ON 07/25/2017] pantoprazole  40 mg Oral Daily  . sodium chloride flush  10-40 mL Intracatheter Q12H  . sodium chloride flush  3 mL Intravenous Q12H   Continuous Infusions: . sodium chloride 10 mL/hr at 07/24/17 0700  . sodium chloride Stopped (07/24/17 0500)  . sodium chloride    . albumin human    . cefUROXime (ZINACEF)  IV Stopped (07/24/17 0330)  . dexmedetomidine (PRECEDEX) IV infusion Stopped (07/23/17 1900)  . famotidine (PEPCID) IV Stopped (07/23/17 1430)  . insulin (NOVOLIN-R) infusion 0.5 Units/hr (07/24/17 0700)  . lactated ringers    . lactated ringers    . lactated ringers 10 mL/hr at 07/24/17 0700  . milrinone 0.2 mcg/kg/min (07/24/17 0700)  . nitroGLYCERIN Stopped (07/24/17 0700)  . phenylephrine (NEO-SYNEPHRINE) Adult infusion Stopped (07/24/17 0400)   PRN Meds:.sodium chloride, albumin human, lactated ringers, metoprolol tartrate, midazolam, morphine injection, ondansetron (ZOFRAN) IV, oxyCODONE, sodium chloride  flush, sodium chloride flush, traMADol  General appearance: alert, cooperative and no distress Neurologic: intact Heart: friction rub heard At apex Lungs: diminished breath sounds bibasilar Abdomen: soft, non-tender; bowel sounds normal; no masses,  no organomegaly Extremities: extremities normal, atraumatic, no cyanosis or edema and Homans sign is negative, no sign of DVT Wound: Dressing intact sternum stable  Lab Results: CBC: Recent Labs    07/23/17 2001 07/23/17 2002 07/24/17 0258  WBC 6.2  --  7.5  HGB 9.7* 8.5* 9.4*  HCT 28.9* 25.0* 28.4*  PLT 79*  --  89*   BMET:  Recent Labs    07/23/17 0146  07/23/17 2002 07/24/17 0258  NA 136   < > 141 137  K 3.5   < > 4.2 3.7  CL 104    < > 107 109  CO2 24  --   --  20*  GLUCOSE 140*   < > 147* 108*  BUN 10   < > 9 8  CREATININE 1.05   < > 0.90 0.97  CALCIUM 8.7*  --   --  7.5*   < > = values in this interval not displayed.    CMET: Lab Results  Component Value Date   WBC 7.5 07/24/2017   HGB 9.4 (L) 07/24/2017   HCT 28.4 (L) 07/24/2017   PLT 89 (L) 07/24/2017   GLUCOSE 108 (H) 07/24/2017   CHOL 103 07/22/2017   TRIG 60 07/22/2017   HDL 30 (L) 07/22/2017   LDLCALC 61 07/22/2017   ALT 33 07/21/2017   AST 89 (H) 07/21/2017   NA 137 07/24/2017   K 3.7 07/24/2017   CL 109 07/24/2017   CREATININE 0.97 07/24/2017   BUN 8 07/24/2017   CO2 20 (L) 07/24/2017   TSH 1.395 07/21/2017   INR 1.46 07/23/2017   HGBA1C 6.7 (H) 07/21/2017      PT/INR:  Recent Labs    07/23/17 1410  LABPROT 17.6*  INR 1.46   Radiology: Dg Chest Port 1 View  Result Date: 07/23/2017 CLINICAL DATA:  Status post CABG today. EXAM: PORTABLE CHEST 1 VIEW COMPARISON:  Single-view of the chest 07/21/2017. FINDINGS: Endotracheal tube is in place with the tip in good position just below the clavicular heads. Right IJ approach Swan-Ganz catheter is in the distal most pulmonary outflow tract. NG tube tip is in the stomach. Mediastinal drain and left chest tube noted. No pneumothorax. Mild bibasilar atelectasis. No pulmonary edema. Heart size normal. IMPRESSION: Support tubes and lines projecting good position. No pneumothorax or acute disease. Electronically Signed   By: Drusilla Kanner M.D.   On: 07/23/2017 14:42     Assessment/Plan: S/P Procedure(s) (LRB): CORONARY ARTERY BYPASS GRAFTING (CABG) x Five , using left internal mammary artery and right leg greater saphenous vein harvested endoscopically (N/A) TRANSESOPHAGEAL ECHOCARDIOGRAM (TEE) (N/A) Mobilize Diuresis Diabetes control d/c tubes/lines See progression orders Expected Acute  Blood - loss Anemia- continue to monitor  Thrombocytopenia, will avoid heparin until platelets  improve, continue with PAS hose Renal function stable   [name] 07/24/2017 8:08 AM

## 2017-07-24 NOTE — Op Note (Signed)
NAMAlain Honey: Blye, Zeferino MEDICAL RECORD [id number] ACCOUNT [id number] DATE OF BIRTH:[birth year] FACILITY: [facility] LOCATION: [facility] PHYSICIAN:Keltie Labell Bari EdwardB. Eithel Ryall, MD  OPERATIVE REPORT  DATE OF PROCEDURE:  07/23/2017  PREOPERATIVE DIAGNOSIS:  Severe coronary occlusive disease with unstable angina.  POSTOPERATIVE DIAGNOSIS:  Severe coronary occlusive disease with unstable angina.    SURGICAL PROCEDURE:  Coronary artery bypass grafting x5 with the left internal mammary to the left anterior descending coronary artery, reverse saphenous vein graft to the diagonal coronary artery, sequential reverse saphenous vein graft to the first and  second obtuse marginal, reverse saphenous vein graft to the distal right coronary artery with right thigh and calf greater saphenous endoscopic vein harvesting.  SURGEON:  Sheliah PlaneEdward Lucerito Rosinski, MD  ASSISTANT:  Jari Favreessa Conte, PA-C   BRIEF HISTORY:  The patient is a 57 year old [place] male who presented with 3 day history of unstable anginal symptoms.  He had no known previous history of coronary disease.  He underwent urgent catheterization by Dr. SwazilandJordan which demonstrated a  high-grade mid right coronary artery stenosis of greater than 90%.  In addition, he had high-grade stenosis of the proximal LAD, diagonal and circumflex vessels.  Overall ventricular function was preserved.  The patient was stabilized medically.   Coronary artery bypass grafting was recommended to the patient, who through interpreter, agreed and signed informed consent.  DESCRIPTION OF PROCEDURE:  With Swan-Ganz and arterial line monitors in place, the patient underwent general endotracheal anesthesia without incident.  Skin of the chest and the legs was prepped with Betadine and draped in the usual sterile manner.   Appropriate timeout was performed and we proceeded with endoscopic vein harvesting of the greater saphenous vein, thigh and calf.  The vein was of good quality and caliber.  Median  sternotomy was performed.  The left internal mammary artery was dissected  down as a pedicle graft.  The distal artery was divided, had good free flow.  The pericardium was opened.  Overall ventricular function was preserved.  The patient was systemically heparinized.  The ascending aorta was cannulated.  The right atrium was  cannulated.  An aortic root vent cardioplegia needle was introduced into the ascending aorta.  The patient was placed on cardiopulmonary bypass, 2.4 L/min/sq m.  Sites of anastomosis were inspected and dissected out of the epicardium.  The patient's body  temperature was cooled to 32 degrees.  Aortic crossclamp was applied and 500 mL of cold blood potassium cardioplegia was administered with diastolic arrest of the heart.  Myocardial septal temperature was monitored throughout the crossclamp.  Attention  was turned first to the distal right coronary artery, which was opened and was a large vessel but with a significant posterior wall plaque.  One and a half probe passed easily.  Using a running 7-0 Prolene, a segment of reverse saphenous vein graft was  anastomosed to the distal right coronary artery.  Attention was then turned to the first and second obtuse marginal vessels.  These were smaller thin-walled vessels partially intramyocardial.  The first OM was opened and admitted a 1 mm probe.  The  vessel was 1.3 mm in size.  Using a diamond type side-to-side anastomosis with a running 8-0 Prolene, anastomosis was completed with a segment of reverse saphenous vein graft.  The distal extent of the same vein was then carried a short distance to the  second obtuse marginal vessel, which was also partially intramyocardial and a thin walled vessel.  Using a running 8-0 Prolene, distal anastomosis was performed.  Additional cold  blood cardioplegia was administered down the vein grafts.  We then turned  our attention to the diagonal coronary artery.  This vessel was opened, admitted a 1.5 mm  probe distally.  Using a running 7-0 Prolene, distal anastomosis was performed.  Attention was then turned to the mid left anterior descending coronary artery.   This vessel was partially intramyocardial.  The vessel was identified, opened, admitted a 1.5 mm probe distally.  Using a running 8-0 Prolene, the left internal mammary artery was anastomosed to the left anterior descending coronary artery.  With release  of the bulldog on the mammary artery, there was a rise in myocardial septal temperature.  The bulldog was placed back in the mammary artery and with cross clamp still in place, 3 punch aortotomies were performed and each of the 3 vein grafts were  anastomosed to the ascending aorta.  The bulldog was then removed from the mammary artery.  The heart was allowed to passively fill and deair and the proximal anastomoses were completed and the aortic crossclamp was removed with a cross clamp time of 113  minutes.  The patient spontaneously converted to a sinus rhythm in the 80s.  Sites of anastomoses were inspected and were free of bleeding.  Atrial and ventricular pacing wires were applied.  The patient was then ventilated and weaned from  cardiopulmonary bypass without difficulty.  He remained hemodynamically stable.  He was decannulated in the usual fashion.  Protamine sulfate was administered with the operative field hemostatic.  A left pleural tube and a Blake mediastinal drain were  left in place.  The pericardium was loosely reapproximated.  The sternum was then closed with #6 stainless steel wire.  Fascia closed with interrupted 0 Vicryl, running 3-0 Vicryl in the subcutaneous tissue, 3-0 subcuticular stitch and skin edges.  Dry  dressings were applied.  Sponge and needle count was reported as correct at completion of the procedure.   The patient tolerated the procedure without obvious complication.  He did not require any blood bank blood products during the operative procedure.   He was  transferred to the surgical intensive care unit for further postoperative care.  AN/NUANCE  D:07/24/2017 T:07/24/2017 JOB:[id number]

## 2017-07-25 ENCOUNTER — Inpatient Hospital Stay (HOSPITAL_COMMUNITY): Payer: BLUE CROSS/BLUE SHIELD

## 2017-07-25 LAB — BASIC METABOLIC PANEL
Anion gap: 7 (ref 5–15)
BUN: 10 mg/dL (ref 6–20)
CO2: 23 mmol/L (ref 22–32)
Calcium: 7.9 mg/dL — ABNORMAL LOW (ref 8.9–10.3)
Chloride: 103 mmol/L (ref 101–111)
Creatinine, Ser: 1.02 mg/dL (ref 0.61–1.24)
GFR calc Af Amer: >60 mL/min (ref >60)
GFR calc non Af Amer: >60 mL/min (ref >60)
Glucose, Bld: 131 mg/dL — ABNORMAL HIGH (ref 65–99)
Potassium: 4.4 mmol/L (ref 3.5–5.1)
Sodium: 133 mmol/L — ABNORMAL LOW (ref 135–145)

## 2017-07-25 LAB — CBC
HCT: 29.9 % — ABNORMAL LOW (ref 39.0–52.0)
Hemoglobin: 9.8 g/dL — ABNORMAL LOW (ref 13.0–17.0)
MCH: 31.4 pg (ref 26.0–34.0)
MCHC: 32.8 g/dL (ref 30.0–36.0)
MCV: 95.8 fL (ref 78.0–100.0)
Platelets: 99 10*3/uL — ABNORMAL LOW (ref 150–400)
RBC: 3.12 MIL/uL — ABNORMAL LOW (ref 4.22–5.81)
RDW: 13.4 % (ref 11.5–15.5)
WBC: 9.1 10*3/uL (ref 4.0–10.5)

## 2017-07-25 LAB — POCT I-STAT, CHEM 8
BUN: 4 mg/dL — AB (ref 6–20)
CALCIUM ION: 0.72 mmol/L — AB (ref 1.15–1.40)
Chloride: 118 mmol/L — ABNORMAL HIGH (ref 101–111)
Creatinine, Ser: 0.3 mg/dL — ABNORMAL LOW (ref 0.61–1.24)
GLUCOSE: 89 mg/dL (ref 65–99)
HCT: 16 % — ABNORMAL LOW (ref 39.0–52.0)
Hemoglobin: 5.4 g/dL — CL (ref 13.0–17.0)
Potassium: 2.2 mmol/L — CL (ref 3.5–5.1)
Sodium: 146 mmol/L — ABNORMAL HIGH (ref 135–145)
TCO2: 13 mmol/L — AB (ref 22–32)

## 2017-07-25 LAB — GLUCOSE, CAPILLARY
GLUCOSE-CAPILLARY: 132 mg/dL — AB (ref 65–99)
GLUCOSE-CAPILLARY: 143 mg/dL — AB (ref 65–99)
GLUCOSE-CAPILLARY: 138 mg/dL — AB (ref 65–99)
Glucose-Capillary: 141 mg/dL — ABNORMAL HIGH (ref 65–99)
Glucose-Capillary: 156 mg/dL — ABNORMAL HIGH (ref 65–99)
Glucose-Capillary: 141 mg/dL — ABNORMAL HIGH (ref 65–99)

## 2017-07-25 MED ORDER — INSULIN ASPART 100 UNIT/ML ~~LOC~~ SOLN
0.0000 [IU] | Freq: Three times a day (TID) | SUBCUTANEOUS | Status: DC
  Administered 2017-07-25 – 2017-07-29 (×14): 2 [IU] via SUBCUTANEOUS

## 2017-07-25 MED ORDER — SODIUM CHLORIDE 0.9% FLUSH: 3.0000 mL | INTRAVENOUS | Status: DC | PRN

## 2017-07-25 MED ORDER — FUROSEMIDE 40 MG PO TABS
40.0000 mg | ORAL_TABLET | Freq: Every day | ORAL | Status: AC
  Administered 2017-07-25 – 2017-07-27 (×3): 40 mg via ORAL
  Filled 2017-07-25 (×3): qty 1

## 2017-07-25 MED ORDER — TRAMADOL HCL 50 MG PO TABS
50.0000 mg | ORAL_TABLET | ORAL | Status: DC | PRN
  Administered 2017-07-25 – 2017-07-26 (×2): 100 mg via ORAL
  Administered 2017-07-29 – 2017-07-30 (×3): 50 mg via ORAL
  Filled 2017-07-25 (×2): qty 1
  Filled 2017-07-25: qty 2
  Filled 2017-07-25: qty 1
  Filled 2017-07-25: qty 2

## 2017-07-25 MED ORDER — ONDANSETRON HCL 4 MG/2ML IJ SOLN
4.0000 mg | Freq: Four times a day (QID) | INTRAMUSCULAR | Status: DC | PRN
  Administered 2017-07-29: 4 mg via INTRAVENOUS
  Filled 2017-07-25: qty 2

## 2017-07-25 MED ORDER — ASPIRIN EC 81 MG PO TBEC
81.0000 mg | DELAYED_RELEASE_TABLET | Freq: Every day | ORAL | Status: DC
  Administered 2017-07-25 – 2017-07-30 (×6): 81 mg via ORAL
  Filled 2017-07-25 (×6): qty 1

## 2017-07-25 MED ORDER — LOSARTAN POTASSIUM-HCTZ 100-25 MG PO TABS: 1.0000 | ORAL_TABLET | Freq: Every day | ORAL | Status: DC

## 2017-07-25 MED ORDER — HYDROCHLOROTHIAZIDE 25 MG PO TABS
25.0000 mg | ORAL_TABLET | Freq: Every day | ORAL | Status: DC
  Administered 2017-07-25: 25 mg via ORAL
  Filled 2017-07-25: qty 1

## 2017-07-25 MED ORDER — SODIUM CHLORIDE 0.9% FLUSH
3.0000 mL | Freq: Two times a day (BID) | INTRAVENOUS | Status: DC
  Administered 2017-07-25 – 2017-07-29 (×8): 3 mL via INTRAVENOUS

## 2017-07-25 MED ORDER — METOPROLOL TARTRATE 12.5 MG HALF TABLET
12.5000 mg | ORAL_TABLET | Freq: Three times a day (TID) | ORAL | Status: DC
  Administered 2017-07-25 (×3): 12.5 mg via ORAL
  Filled 2017-07-25 (×3): qty 1

## 2017-07-25 MED ORDER — CLOPIDOGREL BISULFATE 75 MG PO TABS
75.0000 mg | ORAL_TABLET | Freq: Every day | ORAL | Status: DC
  Administered 2017-07-25 – 2017-07-30 (×6): 75 mg via ORAL
  Filled 2017-07-25 (×6): qty 1

## 2017-07-25 MED ORDER — ACETAMINOPHEN 325 MG PO TABS
650.0000 mg | ORAL_TABLET | Freq: Four times a day (QID) | ORAL | Status: DC | PRN
  Administered 2017-07-29: 650 mg via ORAL
  Filled 2017-07-25: qty 2

## 2017-07-25 MED ORDER — LOSARTAN POTASSIUM 50 MG PO TABS
100.0000 mg | ORAL_TABLET | Freq: Every day | ORAL | Status: DC
  Administered 2017-07-25 – 2017-07-30 (×6): 100 mg via ORAL
  Filled 2017-07-25 (×6): qty 2

## 2017-07-25 MED ORDER — BISACODYL 10 MG RE SUPP: 10.0000 mg | Freq: Every day | RECTAL | Status: DC | PRN

## 2017-07-25 MED ORDER — ENOXAPARIN SODIUM 30 MG/0.3ML ~~LOC~~ SOLN
30.0000 mg | SUBCUTANEOUS | Status: DC
  Administered 2017-07-25 – 2017-07-30 (×6): 30 mg via SUBCUTANEOUS
  Filled 2017-07-25 (×6): qty 0.3

## 2017-07-25 MED ORDER — OXYCODONE HCL 5 MG PO TABS
5.0000 mg | ORAL_TABLET | ORAL | Status: DC | PRN
  Administered 2017-07-25 (×2): 10 mg via ORAL
  Administered 2017-07-25: 5 mg via ORAL
  Administered 2017-07-26 (×2): 10 mg via ORAL
  Administered 2017-07-27: 5 mg via ORAL
  Administered 2017-07-27: 10 mg via ORAL
  Administered 2017-07-27: 5 mg via ORAL
  Administered 2017-07-27 – 2017-07-29 (×5): 10 mg via ORAL
  Filled 2017-07-25 (×3): qty 2
  Filled 2017-07-25: qty 1
  Filled 2017-07-25 (×8): qty 2
  Filled 2017-07-25: qty 1

## 2017-07-25 MED ORDER — PANTOPRAZOLE SODIUM 40 MG PO TBEC
40.0000 mg | DELAYED_RELEASE_TABLET | Freq: Every day | ORAL | Status: DC
  Administered 2017-07-25 – 2017-07-30 (×6): 40 mg via ORAL
  Filled 2017-07-25 (×6): qty 1

## 2017-07-25 MED ORDER — DOCUSATE SODIUM 100 MG PO CAPS
200.0000 mg | ORAL_CAPSULE | Freq: Every day | ORAL | Status: DC
  Administered 2017-07-25 – 2017-07-30 (×4): 200 mg via ORAL
  Filled 2017-07-25 (×6): qty 2

## 2017-07-25 MED ORDER — METFORMIN HCL 500 MG PO TABS
1000.0000 mg | ORAL_TABLET | Freq: Two times a day (BID) | ORAL | Status: DC
Start: 2017-07-25 — End: 2017-07-30
  Administered 2017-07-25 – 2017-07-30 (×11): 1000 mg via ORAL
  Filled 2017-07-25 (×11): qty 2

## 2017-07-25 MED ORDER — ONDANSETRON HCL 4 MG PO TABS: 4.0000 mg | ORAL_TABLET | Freq: Four times a day (QID) | ORAL | Status: DC | PRN

## 2017-07-25 MED ORDER — INSULIN DETEMIR 100 UNIT/ML ~~LOC~~ SOLN
10.0000 [IU] | Freq: Every day | SUBCUTANEOUS | Status: DC
  Administered 2017-07-25 – 2017-07-30 (×6): 10 [IU] via SUBCUTANEOUS
  Filled 2017-07-25 (×6): qty 0.1

## 2017-07-25 MED ORDER — SODIUM CHLORIDE 0.9 % IV SOLN
250.0000 mL | INTRAVENOUS | Status: DC | PRN
Start: 2017-07-25 — End: 2017-07-30

## 2017-07-25 MED ORDER — GUAIFENESIN ER 600 MG PO TB12: 600.0000 mg | ORAL_TABLET | Freq: Two times a day (BID) | ORAL | Status: DC | PRN

## 2017-07-25 MED ORDER — MOVING RIGHT ALONG BOOK
Freq: Once | Status: AC
  Administered 2017-07-25: 08:00:00
  Filled 2017-07-25: qty 1

## 2017-07-25 MED ORDER — BISACODYL 5 MG PO TBEC
10.0000 mg | DELAYED_RELEASE_TABLET | Freq: Every day | ORAL | Status: DC | PRN
  Administered 2017-07-26: 10 mg via ORAL
  Filled 2017-07-25: qty 2

## 2017-07-25 MED FILL — Electrolyte-R (PH 7.4) Solution: INTRAVENOUS | Qty: 4000 | Status: AC

## 2017-07-25 MED FILL — Sodium Chloride IV Soln 0.9%: INTRAVENOUS | Qty: 2000 | Status: AC

## 2017-07-25 MED FILL — Lidocaine HCl(Cardiac) IV PF Soln Pref Syr 100 MG/5ML (2%): INTRAVENOUS | Qty: 5 | Status: AC

## 2017-07-25 MED FILL — Sodium Bicarbonate IV Soln 8.4%: INTRAVENOUS | Qty: 50 | Status: AC

## 2017-07-25 MED FILL — Mannitol IV Soln 20%: INTRAVENOUS | Qty: 500 | Status: AC

## 2017-07-25 MED FILL — Heparin Sodium (Porcine) Inj 1000 Unit/ML: INTRAMUSCULAR | Qty: 20 | Status: AC

## 2017-07-25 NOTE — Progress Notes (Signed)
Pt received from 2H. Wife at bedside. Pt and wife oriented to room and equipment. Telemetry applied, CCMD notified. Call bell within reach. Notified kitchen to re-route dinner tray.   Leonidas Rombergaitlin S Bumbledare, RN

## 2017-07-25 NOTE — Progress Notes (Signed)
Patient ID: [name], male   DOB: October 15, 1960, 57 y.o.   MRN: [id number] TCTS DAILY ICU PROGRESS NOTE                   [address]            [place] [address]          [phone]   2 Days Post-Op Procedure(s) (LRB): CORONARY ARTERY BYPASS GRAFTING (CABG) x Five , using left internal mammary artery and right leg greater saphenous vein harvested endoscopically (N/A) TRANSESOPHAGEAL ECHOCARDIOGRAM (TEE) (N/A)  Total Length of Stay:  LOS: 4 days   Subjective: Patient ambulated around the unit twice last night, up in chair this morning  Objective: Vital signs in last 24 hours: Temp:  [97.8 F (36.6 C)-99.1 F (37.3 C)] 98.6 F (37 C) (04/24 0400) Pulse Rate:  [89-117] 95 (04/24 0615) Cardiac Rhythm: Normal sinus rhythm;Sinus tachycardia (04/24 0400) Resp:  [12-38] 15 (04/24 0615) BP: (105-161)/(76-110) 115/81 (04/24 0500) SpO2:  [84 %-100 %] 96 % (04/24 0615) Arterial Line BP: (112-162)/(55-70) 162/70 (04/23 1200) Weight:  [203 lb 11.3 oz (92.4 kg)] 203 lb 11.3 oz (92.4 kg) (04/24 0500)  Filed Weights   07/22/17 0330 07/24/17 0500 07/25/17 0500  Weight: 184 lb 4.9 oz (83.6 kg) 202 lb 9.6 oz (91.9 kg) 203 lb 11.3 oz (92.4 kg)    Weight change: 1 lb 1.6 oz (0.5 kg)   Hemodynamic parameters for last 24 hours: PAP: (35-38)/(10-20) 38/20  Intake/Output from previous day: 04/23 0701 - 04/24 0700 In: 790 [P.O.:100; I.V.:490; IV Piggyback:200] Out: 940 [Urine:640; Chest Tube:300]  Intake/Output this shift: No intake/output data recorded.  Current Meds: Scheduled Meds: . acetaminophen  1,000 mg Oral Q6H   Or  . acetaminophen (TYLENOL) oral liquid 160 mg/5 mL  1,000 mg Per Tube Q6H  . aspirin EC  325 mg Oral Daily   Or  . aspirin  324 mg Per Tube Daily  . atorvastatin  80 mg Oral q1800  . bisacodyl  10 mg Oral Daily   Or  . bisacodyl  10 mg Rectal Daily  . Chlorhexidine Gluconate Cloth  6 each Topical Daily  . docusate sodium  200 mg Oral Daily    . insulin aspart  0-24 Units Subcutaneous Q4H  . insulin detemir  15 Units Subcutaneous Daily  . mouth rinse  15 mL Mouth Rinse BID  . metoprolol tartrate  12.5 mg Oral TID   Or  . metoprolol tartrate  12.5 mg Per Tube TID  . pantoprazole  40 mg Oral Daily  . sodium chloride flush  10-40 mL Intracatheter Q12H  . sodium chloride flush  3 mL Intravenous Q12H   Continuous Infusions: . sodium chloride Stopped (07/25/17 0400)  . sodium chloride Stopped (07/24/17 0500)  . sodium chloride    . dexmedetomidine (PRECEDEX) IV infusion Stopped (07/23/17 1900)  . lactated ringers    . lactated ringers    . lactated ringers 10 mL/hr at 07/25/17 0600  . nitroGLYCERIN Stopped (07/24/17 0700)  . phenylephrine (NEO-SYNEPHRINE) Adult infusion Stopped (07/24/17 0400)   PRN Meds:.sodium chloride, lactated ringers, metoprolol tartrate, midazolam, morphine injection, ondansetron (ZOFRAN) IV, oxyCODONE, sodium chloride flush, sodium chloride flush, traMADol  General appearance: alert, cooperative and no distress Neurologic: intact Heart: regular rate and rhythm, S1, S2 normal, no murmur, click, rub or gallop Lungs: diminished breath sounds bibasilar Abdomen: soft, non-tender; bowel sounds normal; no masses,  no organomegaly Extremities: extremities normal, atraumatic, no  cyanosis or edema and Homans sign is negative, no sign of DVT Wound: Sternum stable dressing intact  Lab Results: CBC: Recent Labs    07/24/17 1811  07/24/17 2153 07/25/17 0330  WBC 10.2  --   --  9.1  HGB 10.6*   < > 10.1* 9.8*  HCT 32.7*   < > 30.8* 29.9*  PLT 117*  --   --  99*   < > = values in this interval not displayed.   BMET:  Recent Labs    07/24/17 0258  07/24/17 1842 07/25/17 0330  NA 137   < > 135 133*  K 3.7   < > 4.3 4.4  CL 109   < > 101 103  CO2 20*  --   --  23  GLUCOSE 108*   < > 161* 131*  BUN 8   < > 10 10  CREATININE 0.97   < > 1.00 1.02  CALCIUM 7.5*  --   --  7.9*   < > = values in this  interval not displayed.    CMET: Lab Results  Component Value Date   WBC 9.1 07/25/2017   HGB 9.8 (L) 07/25/2017   HCT 29.9 (L) 07/25/2017   PLT 99 (L) 07/25/2017   GLUCOSE 131 (H) 07/25/2017   CHOL 103 07/22/2017   TRIG 60 07/22/2017   HDL 30 (L) 07/22/2017   LDLCALC 61 07/22/2017   ALT 33 07/21/2017   AST 89 (H) 07/21/2017   NA 133 (L) 07/25/2017   K 4.4 07/25/2017   CL 103 07/25/2017   CREATININE 1.02 07/25/2017   BUN 10 07/25/2017   CO2 23 07/25/2017   TSH 1.395 07/21/2017   INR 1.46 07/23/2017   HGBA1C 6.7 (H) 07/21/2017      PT/INR:  Recent Labs    07/23/17 1410  LABPROT 17.6*  INR 1.46   Radiology: No results found.   Assessment/Plan: S/P Procedure(s) (LRB): CORONARY ARTERY BYPASS GRAFTING (CABG) x Five , using left internal mammary artery and right leg greater saphenous vein harvested endoscopically (N/A) TRANSESOPHAGEAL ECHOCARDIOGRAM (TEE) (N/A) Mobilize Diuresis Diabetes control Plan for transfer to step-down: see transfer orders     Delight Ovensdward B Correll Denbow 07/25/2017 7:09 AM

## 2017-07-25 NOTE — Progress Notes (Addendum)
Pt has ambulated in the hall multiple times tonight with wife. Tolerated well. Pt is asleep. Wife and daughter at bedside. Will continue to monitor.   Judithann SheenJuhi Keath Matera, RN

## 2017-07-26 ENCOUNTER — Inpatient Hospital Stay (HOSPITAL_COMMUNITY): Payer: BLUE CROSS/BLUE SHIELD

## 2017-07-26 LAB — CBC
HCT: 28.1 % — ABNORMAL LOW (ref 39.0–52.0)
Hemoglobin: 9.2 g/dL — ABNORMAL LOW (ref 13.0–17.0)
MCH: 30.9 pg (ref 26.0–34.0)
MCHC: 32.7 g/dL (ref 30.0–36.0)
MCV: 94.3 fL (ref 78.0–100.0)
Platelets: 131 K/uL — ABNORMAL LOW (ref 150–400)
RBC: 2.98 MIL/uL — ABNORMAL LOW (ref 4.22–5.81)
RDW: 13.1 % (ref 11.5–15.5)
WBC: 9.3 K/uL (ref 4.0–10.5)

## 2017-07-26 LAB — TYPE AND SCREEN
ABO/RH(D): O POS
Antibody Screen: NEGATIVE
Unit division: 0
Unit division: 0

## 2017-07-26 LAB — GLUCOSE, CAPILLARY
Glucose-Capillary: 126 mg/dL — ABNORMAL HIGH (ref 65–99)
Glucose-Capillary: 137 mg/dL — ABNORMAL HIGH (ref 65–99)
Glucose-Capillary: 115 mg/dL — ABNORMAL HIGH (ref 65–99)
Glucose-Capillary: 144 mg/dL — ABNORMAL HIGH (ref 65–99)

## 2017-07-26 LAB — BASIC METABOLIC PANEL
Anion gap: 8 (ref 5–15)
BUN: 13 mg/dL (ref 6–20)
CO2: 27 mmol/L (ref 22–32)
Calcium: 7.9 mg/dL — ABNORMAL LOW (ref 8.9–10.3)
Chloride: 97 mmol/L — ABNORMAL LOW (ref 101–111)
Creatinine, Ser: 1.12 mg/dL (ref 0.61–1.24)
GFR calc Af Amer: >60 mL/min (ref >60)
GFR calc non Af Amer: >60 mL/min (ref >60)
Glucose, Bld: 121 mg/dL — ABNORMAL HIGH (ref 65–99)
Potassium: 3.8 mmol/L (ref 3.5–5.1)
Sodium: 132 mmol/L — ABNORMAL LOW (ref 135–145)

## 2017-07-26 LAB — BPAM RBC
Blood Product Expiration Date: 201905172359
Blood Product Expiration Date: 201905172359
ISSUE DATE / TIME: 201904220828
ISSUE DATE / TIME: 201904220828
Unit Type and Rh: 5100
Unit Type and Rh: 5100

## 2017-07-26 MED ORDER — METOPROLOL TARTRATE 25 MG PO TABS
25.0000 mg | ORAL_TABLET | Freq: Two times a day (BID) | ORAL | Status: DC
  Administered 2017-07-26 – 2017-07-27 (×4): 25 mg via ORAL
  Filled 2017-07-26 (×4): qty 1

## 2017-07-26 MED ORDER — POTASSIUM CHLORIDE CRYS ER 20 MEQ PO TBCR
40.0000 meq | EXTENDED_RELEASE_TABLET | Freq: Every day | ORAL | Status: DC
  Administered 2017-07-26 – 2017-07-30 (×5): 40 meq via ORAL
  Filled 2017-07-26 (×5): qty 2

## 2017-07-26 NOTE — Progress Notes (Signed)
CARDIAC REHAB PHASE I   PRE:  Rate/Rhythm: 108 ST    BP: sitting 103/60    SaO2: 91 RA  MODE:  Ambulation: 470 ft   POST:  Rate/Rhythm: 115 ST    BP: sitting 110/70     SaO2: 94 RA  Pt able to move out of bed and walk with RW. Steady but very slow. Rest x2. Quite tired after walking, went recliner and fell asleep. Gave family diets in [place] and we will do education tomorrow at 1315 with [place]. Pt practiced IS.  [phone]   Kenneth MassonRandi Kristan Mackenize Carlson CES, ACSM 07/26/2017 11:55 AM

## 2017-07-26 NOTE — Discharge Summary (Signed)
Physician Discharge Summary  Patient ID: [name] MRN: [id number] DOB/AGE: 55/07/1960 55 y.o.  Admit date: 07/21/2017 Discharge date: 07/30/2017  Admission Diagnoses:  Patient Active Problem List   Diagnosis Date Noted  . NSTEMI (non-ST elevated myocardial infarction) (HCC)   . Essential hypertension   . Hyperlipidemia LDL goal <70   . Type 2 diabetes mellitus with hyperosmolarity without coma, without long-term current use of insulin [facility](HCC)    Discharge Diagnoses:   Patient Active Problem List   Diagnosis Date Noted  . Contact dermatitis 07/27/2017  . 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 NSTEMI (non-ST elevated myocardial infarction) (HCC)   . Essential hypertension   . Hyperlipidemia LDL goal <70   . Type 2 diabetes mellitus with hyperosmolarity without coma, without long-term current use of insulin (HCC)    Discharged Condition: good  History of Present Illness:  Mr. [name] is a 57 year old Hispanic male ( Non-english speaking) with no previous cardiac history but risk factors notable for history of type 2 diabetes mellitus, hypertension, hyperlipidemia, and remote history of tobacco use.  Patient states that he began to experience substernal burning chest pain approximately 3 days ago.  Symptoms wax and wane for several days but became constant and persisted yesterday.  He denies associated shortness of breath, nausea, or diaphoresis.  Patient initially went to urgent care where EKG revealed T wave inversions in inferior leads.  Initial troponin levels were elevated.  He was evaluated initially by Dr. Mayford Knifeurner and admitted for non-ST segment elevation myocardial infarction.  The patient had recurrent chest pain and was subsequently taken directly to the Cath Lab by Dr. SwazilandJordan.  Catheterization revealed severe three-vessel coronary artery disease with preserved left ventricular function.  Cardiothoracic surgical consultation was requested.    Hospital Course:   He was evaluated by Dr.  Cornelius Moraswen who was in agreement the patient would benefit from coronary bypass procedure.  The risks and benefits of the procedure were explained to the patient and he was agreeable to proceed.  His surgery would be performed by Dr. Tyrone SageGerhardt due to OR scheduling availability.  He remained chest pain free during hospitalization.  He was taken to the operating room on 07/23/2017.  He underwent CABG x 5 utilizing LIMA to LAD, SVG to Diagonal, Reverse Sequential SVG to OM 1 and OM 2, and RSVG to RCA.  He also underwent endoscopic harvest of greater saphenous vein from the right leg.  He tolerated the procedure without difficulty and was taken to the SICU in stable condition.  He was extubated the evening of surgery.  During his stay in the SICU the patients chest tubes and arterial lines were removed without difficulty.  He was weaned off Milrinone as tolerated.  He was hypertensive and restarted on his home regimen of Cozaar.   He was maintaining NSR.  He was ambulating independently around the SICU and was medically stable for transfer to the stepdown unit on 07/25/2017.  He continues to make progress.  He was maintaining NSR and his pacing wires were removed without difficulty.  He was mildly tachycardic and his lopressor dose was increased as tolerated.  He developed a mild rash along his right flank with extension across his lower back.  He described the rash as being burning in nature with itching.  Hospitalist was consulted and felt the rash was contact Dermatitis.  He was ordered Kenalog cream for this. He developed brief runs of Atrial Fibrillation.  His beta blocker dose was increased and he  was started on oral Amiodarone.  Cardiology consult was requested and they recommended a 6-8 week course of Eliquis due to persistent episodes of A. Fib to decrease his stroke risk.  He continues to ambulate independently.  He is tolerating a regular diet.  He is medically stable for discharge home today.    Significant  Diagnostic Studies: angiography:    Prox RCA lesion is 60% stenosed.  Mid RCA lesion is 99% stenosed.  Ost LM lesion is 50% stenosed.  Ost LAD to Prox LAD lesion is 50% stenosed.  Prox LAD lesion is 85% stenosed.  Ost 1st Diag to 1st Diag lesion is 90% stenosed.  Ost 1st Mrg to 1st Mrg lesion is 70% stenosed.  Ost 2nd Mrg to 2nd Mrg lesion is 50% stenosed.  The left ventricular systolic function is normal.  LV end diastolic pressure is normal.  The left ventricular ejection fraction is 50-55% by visual estimate.   1. Severe 3 vessel obstructive CAD. There is some ostial left main disease that is not severe but there is dampening of pressure with catheter engagement.    - complex bifurcation LAD/large diagonal stenosis.     - moderate OM disease    - severe mid RCA stenosis.  2. Low normal EF with inferior Hypokinesis 3. Normal LVEDP  Treatments: surgery:   Coronary artery bypass grafting x5 with the left internal mammary to the left anterior descending coronary artery, reverse saphenous vein graft to the diagonal coronary artery, sequential reverse saphenous vein graft to the first and second obtuse marginal, reverse saphenous vein graft to the distal right coronary artery with right thigh and calf greater saphenous endoscopic vein harvesting.  Disposition: Home  Discharge Medications:  The patient has been discharged on:   1.Beta Blocker:  Yes [x   ]                              No   [   ]                              If No, reason:  2.Ace Inhibitor/ARB: Yes [ x  ]                                     No  [    ]                                     If No, reason:  3.Statin:   Yes [ x  ]                  No  [   ]                  If No, reason:  4.Ecasa:  Yes  [x   ]                  No   [   ]                  If No, reason:     Discharge Instructions    Amb Referral to Cardiac Rehabilitation   Complete by:  As directed    Diagnosis:  CABG  CABG X  ___:  5     Allergies as of 07/30/2017      Reactions   Tape       Medication List    STOP taking these medications   ibuprofen 600 MG tablet Commonly known as:  ADVIL,MOTRIN     TAKE these medications   acetaminophen 500 MG tablet Commonly known as:  TYLENOL Take 1 tablet (500 mg total) by mouth every 6 (six) hours as needed.   amiodarone 400 MG tablet Commonly known as:  PACERONE Take 1 tablet (400 mg total) by mouth every 12 (twelve) hours. X 7 days, then decrease to 400 mg (1 tablet) daily   apixaban 5 MG Tabs tablet Commonly known as:  ELIQUIS Take 1 tablet (5 mg total) by mouth 2 (two) times daily.   aspirin 81 MG EC tablet Take 1 tablet (81 mg total) by mouth daily. Start taking on:  07/31/2017   atorvastatin 80 MG tablet Commonly known as:  LIPITOR Take 1 tablet (80 mg total) by mouth daily at 6 PM. What changed:    medication strength  how much to take  when to take this   lidocaine 2 % solution Commonly known as:  XYLOCAINE Use as directed 20 mLs in the mouth or throat as needed for mouth pain.   losartan-hydrochlorothiazide 100-25 MG tablet Commonly known as:  HYZAAR Take 1 tablet by mouth daily.   metFORMIN 1000 MG tablet Commonly known as:  GLUCOPHAGE Take 1,000 mg by mouth 2 (two) times daily.   metoprolol tartrate 50 MG tablet Commonly known as:  LOPRESSOR Take 1 tablet (50 mg total) by mouth 2 (two) times daily.   traMADol 50 MG tablet Commonly known as:  ULTRAM Take 1 tablet (50 mg total) by mouth every 4 (four) hours as needed for moderate pain.   triamcinolone 0.025 % cream Commonly known as:  KENALOG Apply topically 4 (four) times daily for 7 days. To rash along back            Durable Medical Equipment  (From admission, onward)        Start     Ordered   07/26/17 1607  For home use only DME Walker rolling  Once    Comments:  Post op CABG  Question:  Patient needs a walker to treat with the following condition  Answer:   Weakness   07/26/17 1606     Follow-up Information    [facility] [place] Follow up on 08/28/2017.   Specialty:  Cardiothoracic Surgery Why:  Appointment is at 1:00, please get CXR at 12:30 at [facility] located on first floor of our office building Contact information: [address] [place], [address] [place] [address] [phone]       Dyann Kief, [place]. Go on 08/09/2017.   Specialty:  Cardiology Why:  @10am  for post hospital follow up Contact information: [address] [place] [address] [phone]        [facility] - Dme Follow up.   Why:  rolling walker arranged- to be delivered to room prior to discharge Contact information: [address] [place] [address] [place] [address] [phone]           Signed: [name] 07/30/2017, 1:17 PM

## 2017-07-26 NOTE — Progress Notes (Addendum)
      [address]       [place] [id number]             [phone]       3 Days Post-Op Procedure(s) (LRB): CORONARY ARTERY BYPASS GRAFTING (CABG) x Five , using left internal mammary artery and right leg greater saphenous vein harvested endoscopically (N/A) TRANSESOPHAGEAL ECHOCARDIOGRAM (TEE) (N/A)   Subjective:  Interpretation provided by [employer] services.  Patient complains of pain in his chest.  Pain medication does provide relief.  He is ambulating.    Objective: Vital signs in last 24 hours: Temp:  [97.9 F (36.6 C)-98.7 F (37.1 C)] 98.4 F (36.9 C) (04/25 0415) Pulse Rate:  [83-115] 94 (04/25 0415) Cardiac Rhythm: Sinus tachycardia (04/24 1925) Resp:  [14-35] 22 (04/25 0415) BP: (113-133)/(65-101) 131/79 (04/25 0415) SpO2:  [88 %-99 %] 98 % (04/25 0415) FiO2 (%):  [1 %] 1 % (04/24 1600) Weight:  [199 lb 1.6 oz (90.3 kg)] 199 lb 1.6 oz (90.3 kg) (04/25 0415)  Intake/Output from previous day: 04/24 0701 - 04/25 0700 In: 940 [P.O.:900; I.V.:40] Out: 2465 [Urine:2375; Chest Tube:90]  General appearance: alert, cooperative and no distress Heart: regular rate and rhythm and tachy Lungs: clear to auscultation bilaterally Abdomen: soft, non-tender; bowel sounds normal; no masses,  no organomegaly Extremities: edema trace Wound: clean and dry  Lab Results: Recent Labs    07/25/17 0330 07/26/17 0258  WBC 9.1 9.3  HGB 9.8* 9.2*  HCT 29.9* 28.1*  PLT 99* 131*   BMET:  Recent Labs    07/25/17 0330 07/26/17 0258  NA 133* 132*  K 4.4 3.8  CL 103 97*  CO2 23 27  GLUCOSE 131* 121*  BUN 10 13  CREATININE 1.02 1.12  CALCIUM 7.9* 7.9*    PT/INR:  Recent Labs    07/23/17 1410  LABPROT 17.6*  INR 1.46   ABG    Component Value Date/Time   PHART 7.345 (L) 07/23/2017 2050   HCO3 17.5 (L) 07/23/2017 2050   TCO2 23 07/24/2017 1842   ACIDBASEDEF 7.0 (H) 07/23/2017 2050   O2SAT 91.0 07/23/2017 2050   CBG (last 3)  Recent Labs      07/25/17 1545 07/25/17 2213 07/26/17 0624  GLUCAP 138* 141* 126*    Assessment/Plan: S/P Procedure(s) (LRB): CORONARY ARTERY BYPASS GRAFTING (CABG) x Five , using left internal mammary artery and right leg greater saphenous vein harvested endoscopically (N/A) TRANSESOPHAGEAL ECHOCARDIOGRAM (TEE) (N/A)  1. CV- Sinus Tach, BP is mostly controlled- will increase Lopressor to 25 mg BID, continue Cozaar/HCTZ 2. Pulm- no acute issues, off oxygen, CXR is pending 3. Renal-creatinine mildly elevated at 1.12, weight is trending down.. Will stop HCTZ with mild elevation in creatinine, continue Lasix for now, K at 3.8 will supplement 4. DM- sugars well controlled 5. Dispo- patient stable, adjust Lopressor for Tachycardia, continue Cozaar, will stop HCTZ for now, continue current care, if HR remains stable, d/c EPW in AM, possibly home saturday   LOS: 5 days    Lowella Dandyrin Barrett 07/26/2017 Progressing well Home 1-2 days  I have seen and examined [name] and agree with the above assessment  and plan.  Delight OvensEdward B Jolyn Deshmukh MD [facility] [phone] Office [phone] 07/26/2017 8:40 AM

## 2017-07-27 DIAGNOSIS — L259 Unspecified contact dermatitis, unspecified cause: Secondary | ICD-10-CM

## 2017-07-27 DIAGNOSIS — L231 Allergic contact dermatitis due to adhesives: Secondary | ICD-10-CM

## 2017-07-27 LAB — GLUCOSE, CAPILLARY
GLUCOSE-CAPILLARY: 151 mg/dL — AB (ref 65–99)
GLUCOSE-CAPILLARY: 134 mg/dL — AB (ref 65–99)
Glucose-Capillary: 120 mg/dL — ABNORMAL HIGH (ref 65–99)
Glucose-Capillary: 120 mg/dL — ABNORMAL HIGH (ref 65–99)

## 2017-07-27 MED ORDER — LACTULOSE 10 GM/15ML PO SOLN
20.0000 g | Freq: Once | ORAL | Status: AC
  Administered 2017-07-27: 20 g via ORAL
  Filled 2017-07-27: qty 30

## 2017-07-27 MED ORDER — VALACYCLOVIR HCL 500 MG PO TABS: 1000.0000 mg | ORAL_TABLET | Freq: Three times a day (TID) | ORAL | Status: DC

## 2017-07-27 MED ORDER — HYDROCORTISONE 1 % EX CREA
TOPICAL_CREAM | Freq: Two times a day (BID) | CUTANEOUS | Status: DC | PRN
  Administered 2017-07-28 – 2017-07-29 (×3): via TOPICAL
  Filled 2017-07-27: qty 28

## 2017-07-27 MED ORDER — TRIAMCINOLONE ACETONIDE 0.025 % EX CREA
TOPICAL_CREAM | Freq: Four times a day (QID) | CUTANEOUS | Status: DC
  Administered 2017-07-27 – 2017-07-30 (×13): via TOPICAL
  Filled 2017-07-27 (×2): qty 15

## 2017-07-27 NOTE — Progress Notes (Addendum)
Patient has been having all day short runs of At. Fib rate 120-140's. Then goes back into S.R. Cont. To monitor patient and rhythm . R.N. Aware.

## 2017-07-27 NOTE — Progress Notes (Signed)
Removed pacing wires per provider orders. Wires were intact when removed and patient tolerated well. Band-aid placed over area and area is clean dry and intact. Patient  Vital signs are stable. Patient will remain on bedrest until 12:36. Will continue to monitor.

## 2017-07-27 NOTE — Consult Note (Signed)
History and Physical    Alain HoneyRefugio Gruenwald WUJ:[id number] DOB: 03/29/1961 DOA: 07/21/2017   PCP: [name], [name], PA-C   Patient coming from:  Home    Chief Complaint: "Rushing to back"  HPI: Jaxiel Melina Schoolsburto is a 57 y.o. male with medical history significant for CAD status post in STEMI s/p CABG x5 for severe three-vessel obstructive disease, tolerating the procedure well, essential hypertension, hyperlipidemia, diabetes on chronic insulin, noted by TCT a to have pruritic rash in his back, bilateral flank since this morning.  There is no extension to the abdomen.  He denies any open vesicle areas, he denies any other areas of rash.  He applied moisturizer to the back, bleed good response, stating that he was feeling relief upon placing cream in the area.  He denies any pain in that rash.  She did have chickenpox as a child. Denies fevers, chills or night sweats.   He denies any abdominal pain, nausea or vomiting.  He denies any chest pain, other than the incisional chest wall pain.  He denies any shortness of breath or cough.  He denies any dysuria or gross hematuria.  He denies any lower extremity swelling.   ED Course:  BP 126/74 (BP Location: Right Arm)   Pulse 89   Temp 98.4 F (36.9 C) (Oral)   Resp 19   Ht 5\' 6"  (1.676 m)   Wt 88.9 kg (196 lb)   SpO2 99%   BMI 31.64 kg/m    WBC normal at 9.3   Review of Systems:  As per HPI otherwise all other systems reviewed and are negative  Past Medical History:  Diagnosis Date  . Diabetes mellitus without complication (HCC)   . Hyperlipidemia   . Hypertension     Past Surgical History:  Procedure Laterality Date  . CORONARY ARTERY BYPASS GRAFT N/A 07/23/2017   Procedure: CORONARY ARTERY BYPASS GRAFTING (CABG) x Five , using left internal mammary artery and right leg greater saphenous vein harvested endoscopically;  Surgeon: Delight OvensGerhardt, Edward B, MD;  [facility];  Service: Open Heart Surgery;  Laterality: N/A;  . LEFT HEART CATH AND  CORONARY ANGIOGRAPHY N/A 07/22/2017   Procedure: LEFT HEART CATH AND CORONARY ANGIOGRAPHY;  Surgeon: SwazilandJordan, Peter M, MD;  [facility];  Service: Cardiovascular;  Laterality: N/A;  . TEE WITHOUT CARDIOVERSION N/A 07/23/2017   Procedure: TRANSESOPHAGEAL ECHOCARDIOGRAM (TEE);  Surgeon: Delight OvensGerhardt, Edward B, MD;  [facility];  Service: Open Heart Surgery;  Laterality: N/A;    Social History Social History   Socioeconomic History  . Marital status: Married    Spouse name: Not on file  . Number of children: Not on file  . Years of education: Not on file  . Highest education level: Not on file  Occupational History  . Not on file  Social Needs  . Financial resource strain: Not on file  . Food insecurity:    Worry: Not on file    Inability: Not on file  . Transportation needs:    Medical: Not on file    Non-medical: Not on file  Tobacco Use  . Smoking status: Former Smoker    Last attempt to quit: 07/22/1998    Years since quitting: 19.0  . Smokeless tobacco: Never Used  Substance and Sexual Activity  . Alcohol use: Yes  . Drug use: No  . Sexual activity: Not on file  Lifestyle  . Physical activity:    Days per week: Not on file  Minutes per session: Not on file  . Stress: Not on file  Relationships  . Social connections:    Talks on phone: Not on file    Gets together: Not on file    Attends religious service: Not on file    Active member of club or organization: Not on file    Attends meetings of clubs or organizations: Not on file    Relationship status: Not on file  . Intimate partner violence:    Fear of current or ex partner: Not on file    Emotionally abused: Not on file    Physically abused: Not on file    Forced sexual activity: Not on file  Other Topics Concern  . Not on file  Social History Narrative  . Not on file     No Known Allergies  Family History  Problem Relation Age of Onset  . Heart disease Mother       Prior to  Admission medications   Medication Sig Start Date End Date Taking? Authorizing Provider  atorvastatin (LIPITOR) 40 MG tablet Take 40 mg by mouth at bedtime. 07/06/17  Yes [provider]  losartan-hydrochlorothiazide (HYZAAR) 100-25 MG tablet Take 1 tablet by mouth daily. 07/06/17  Yes [provider]  metFORMIN (GLUCOPHAGE) 1000 MG tablet Take 1,000 mg by mouth 2 (two) times daily. 07/06/17  Yes [provider]  acetaminophen (TYLENOL) 500 MG tablet Take 1 tablet (500 mg total) by mouth every 6 (six) hours as needed. Patient not taking: Reported on 07/21/2017 11/01/14   Piepenbrink, Victorino Dike, PA-C  ibuprofen (ADVIL,MOTRIN) 600 MG tablet Take 1 tablet (600 mg total) by mouth every 6 (six) hours as needed. Patient not taking: Reported on 07/21/2017 11/01/14   Piepenbrink, Victorino Dike, PA-C  lidocaine (XYLOCAINE) 2 % solution Use as directed 20 mLs in the mouth or throat as needed for mouth pain. Patient not taking: Reported on 07/21/2017 11/01/14   Francee Piccolo, PA-C    Physical Exam:  Vitals:   07/26/17 1307 07/26/17 2030 07/26/17 2350 07/27/17 0505  BP: 124/70 124/78 113/67 126/74  Pulse: 90 (!) 108 79 89  Resp: 18 18 16 19   Temp: 98.1 F (36.7 C) 98.1 F (36.7 C) 98.3 F (36.8 C) 98.4 F (36.9 C)  TempSrc: Oral Oral Oral Oral  SpO2: 98% 95% 97% 99%  Weight:    88.9 kg (196 lb)  Height:       Constitutional: NAD, calm, comfortable other than complaining of itching in bilateral flank area Eyes: PERRL, lids and conjunctivae normal ENMT: Mucous membranes are moist, without exudate or lesions  Neck: normal, supple, no masses, no thyromegaly Respiratory: clear to auscultation bilaterally, no wheezing, no crackles. Normal respiratory effort  Cardiovascular: Regular rate and rhythm,  murmur, rubs or gallops. No extremity edema. 2+ pedal pulses. No carotid bruits.  Well-healing thoracotomy scar, without drainage. Abdomen: Soft, non tender, No hepatosplenomegaly.  Bowel sounds positive.  Musculoskeletal: no clubbing / cyanosis. Moves all extremities Skin: no jaundice, bilateral flank rash extending upwards to the back, and some erythema around the Band-Aid area on the right posterior chest and lower sacral area Neurologic: Sensation intact  Strength equal in all extremities Psychiatric:   Alert and oriented x 3. Normal mood.     Labs on Admission: I have personally reviewed following labs and imaging studies  CBC: Recent Labs  Lab 07/23/17 2001  07/24/17 0258 07/24/17 1811 07/24/17 1829 07/24/17 1842 07/24/17 2153 07/25/17 0330 07/26/17 0258  WBC 6.2  --  7.5 10.2  --   --   --  9.1 9.3  HGB 9.7*   < > 9.4* 10.6* 5.4* 10.9* 10.1* 9.8* 9.2*  HCT 28.9*   < > 28.4* 32.7* 16.0* 32.0* 30.8* 29.9* 28.1*  MCV 93.5  --  94.0 96.5  --   --   --  95.8 94.3  PLT 79*  --  89* 117*  --   --   --  99* 131*   < > = values in this interval not displayed.    Basic Metabolic Panel: Recent Labs  Lab 07/21/17 1818 07/22/17 0303 07/23/17 0146  07/23/17 2001  07/24/17 0258 07/24/17 1811 07/24/17 1829 07/24/17 1842 07/25/17 0330 07/26/17 0258  NA 137 136 136   < >  --    < > 137  --  146* 135 133* 132*  K 3.8 3.8 3.5   < >  --    < > 3.7  --  2.2* 4.3 4.4 3.8  CL 100* 102 104   < >  --    < > 109  --  118* 101 103 97*  CO2 24 24 24   --   --   --  20*  --   --   --  23 27  GLUCOSE 128* 134* 140*   < >  --    < > 108*  --  89 161* 131* 121*  BUN 11 15 10    < >  --    < > 8  --  4* 10 10 13   CREATININE 1.10 1.00 1.05   < > 0.93   < > 0.97 1.13 0.30* 1.00 1.02 1.12  CALCIUM 9.4 8.4* 8.7*  --   --   --  7.5*  --   --   --  7.9* 7.9*  MG 1.6*  --   --   --  2.8*  --  2.3 2.3  --   --   --   --   PHOS 3.6  --   --   --   --   --   --   --   --   --   --   --    < > = values in this interval not displayed.    GFR: Estimated Creatinine Clearance: 78.7 mL/min (by C-G formula based on SCr of 1.12 mg/dL).  Liver Function Tests: Recent Labs  Lab  07/21/17 1818  AST 89*  ALT 33  ALKPHOS 82  BILITOT 1.1  PROT 7.6  ALBUMIN 4.3   No results for input(s): LIPASE, AMYLASE in the last 168 hours. No results for input(s): AMMONIA in the last 168 hours.  Coagulation Profile: Recent Labs  Lab 07/22/17 0303 07/22/17 2241 07/23/17 1410  INR 1.06 1.10 1.46    Cardiac Enzymes: Recent Labs  Lab 07/21/17 2152 07/22/17 0303 07/22/17 0942  TROPONINI 10.55* 18.94* 14.57*    BNP (last 3 results) No results for input(s): PROBNP in the last 8760 hours.  HbA1C: No results for input(s): HGBA1C in the last 72 hours.  CBG: Recent Labs  Lab 07/26/17 0624 07/26/17 1104 07/26/17 1701 07/26/17 2026 07/27/17 0615  GLUCAP 126* 137* 115* 144* 120*    Lipid Profile: No results for input(s): CHOL, HDL, LDLCALC, TRIG, CHOLHDL, LDLDIRECT in the last 72 hours.  Thyroid Function Tests: No results for input(s): TSH, T4TOTAL, FREET4, T3FREE, THYROIDAB in the last 72 hours.  Anemia Panel: No results for input(s): VITAMINB12, FOLATE, FERRITIN, TIBC,  IRON, RETICCTPCT in the last 72 hours.  Urine analysis:    Component Value Date/Time   COLORURINE YELLOW 07/23/2017 0207   APPEARANCEUR CLEAR 07/23/2017 0207   LABSPEC 1.009 07/23/2017 0207   PHURINE 6.0 07/23/2017 0207   GLUCOSEU NEGATIVE 07/23/2017 0207   HGBUR NEGATIVE 07/23/2017 0207   BILIRUBINUR NEGATIVE 07/23/2017 0207   KETONESUR NEGATIVE 07/23/2017 0207   PROTEINUR NEGATIVE 07/23/2017 0207   UROBILINOGEN 1.0 11/01/2009 0012   NITRITE NEGATIVE 07/23/2017 0207   LEUKOCYTESUR NEGATIVE 07/23/2017 0207    Sepsis Labs: @LABRCNTIP (procalcitonin:4,lacticidven:4) ) Recent Results (from the past 240 hour(s))  MRSA PCR Screening     Status: None   Collection Time: 07/22/17  2:08 AM  Result Value Ref Range Status   MRSA by PCR NEGATIVE NEGATIVE Final    Comment:        The GeneXpert MRSA Assay (FDA approved for NASAL specimens only), is one component of a comprehensive MRSA  colonization surveillance program. It is not intended to diagnose MRSA infection nor to guide or monitor treatment for MRSA infections. Performed at [facility], [address] [place], [place] [address]   Surgical pcr screen     Status: None   Collection Time: 07/23/17  1:10 AM  Result Value Ref Range Status   MRSA, PCR NEGATIVE NEGATIVE Final   Staphylococcus aureus NEGATIVE NEGATIVE Final    Comment: (NOTE) The Xpert SA Assay (FDA approved for NASAL specimens in patients 32 years of age and older), is one component of a comprehensive surveillance program. It is not intended to diagnose infection nor to guide or monitor treatment. Performed at [facility], [address] [place], [place] [address]      Radiological Exams on Admission: Dg Chest 2 View  Result Date: 07/26/2017 CLINICAL DATA:  Chest soreness after CABG EXAM: CHEST - 2 VIEW COMPARISON:  Portable chest x-ray of 07/25/2017 FINDINGS: Aeration may have improved slightly. The left chest tube has been removed and no pneumothorax is seen. Also, the venous sheath has been removed with no right pneumothorax noted. Opacity remains particularly medially at the left lung base which may represent atelectasis, but pneumonia cannot be excluded. The heart is mildly enlarged and stable. Median sternotomy sutures are noted secondary to recent CABG. IMPRESSION: 1. Left chest tube removed.  No pneumothorax. 2. Opacity remains medially at the left lung base. Atelectasis versus pneumonia. Electronically Signed   By: Dwyane Dee M.D.   On: 07/26/2017 10:00    EKG: Independently reviewed.  Assessment/Plan Principal Problem:   Shingles rash Active Problems:   NSTEMI (non-ST elevated myocardial infarction) (HCC)   S/P CABG x 5   Lower thoracic rash consistent with contact dermatitis. Afebrile, WBC normal, no other areas of rash or open lesions. No vesicles noted. Denies pain the area  Start Kenalog moderate strength cream  qid to the rash areas, Avoid tape, may be allergic Keep gown open as frequent as possible to allow air in the affected area    Thank you for allowing the opportunity to participate in the care of this nice patient.  Marlowe Kays, PA-C [facility]   Amion text  [phone]   07/27/2017, 9:05 AM

## 2017-07-27 NOTE — Care Management Note (Signed)
Case Management Note Donn PieriniKristi Roshan Salamon RN, BSN Unit 4E-Case Manager-- 2H coverage [phone]  Patient Details  Name: Alain HoneyRefugio Ballweg MRN: [id number] Date of Birth: 07/09/1960  Subjective/Objective: Pt admitted with NSTEMI- s/p CABGx5 on 07/23/17                 Action/Plan: PTA Pt lived at home with spouse- anticipate return home- CM to follow for transition of care needs  Expected Discharge Date:                  Expected Discharge Plan:  Home/Self Care  In-House Referral:  NA  Discharge planning Services  CM Consult  Post Acute Care Choice:  Durable Medical Equipment Choice offered to:  Patient  DME Arranged:  [facility] DME Agency:  [facility]  [facility]:     Status of Service:  In process, will continue to follow  If discussed at Long Length of Stay Meetings, dates discussed:    Discharge Disposition:   Additional Comments:  07/27/17- 1145- Donn PieriniKristi Deral Schellenberg RN, CM- per cardiac rehab pt will need RW for home- DME order has been placed and [facility]W [facility]- RW to be delivered to room prior to discharge.   Darrold SpanWebster, Brittany Osier Hall, RN 07/27/2017, 11:45 AM

## 2017-07-27 NOTE — Progress Notes (Addendum)
[address]       [place] [id number]             [phone]      4 Days Post-Op Procedure(s) (LRB): CORONARY ARTERY BYPASS GRAFTING (CABG) x Five , using left internal mammary artery and right leg greater saphenous vein harvested endoscopically (N/A) TRANSESOPHAGEAL ECHOCARDIOGRAM (TEE) (N/A)   Subjective:  Translation services again utilized for appointment. Mr. [name] complains of burning along his side and back this morning.  Nursing states he has a rash.  He continues to have chest discomfort which again is relieved with pain medication.  + ambulation  No BM, + Flatus  Objective: Vital signs in last 24 hours: Temp:  [98.1 F (36.7 C)-98.4 F (36.9 C)] 98.4 F (36.9 C) (04/26 0505) Pulse Rate:  [79-108] 89 (04/26 0505) Cardiac Rhythm: Sinus tachycardia (04/25 1920) Resp:  [16-19] 19 (04/26 0505) BP: (113-126)/(67-78) 126/74 (04/26 0505) SpO2:  [95 %-99 %] 99 % (04/26 0505) Weight:  [196 lb (88.9 kg)] 196 lb (88.9 kg) (04/26 0505)  Intake/Output from previous day: 04/25 0701 - 04/26 0700 In: 720 [P.O.:720] Out: -   General appearance: alert, cooperative and no distress Heart: regular rate and rhythm Lungs: clear to auscultation bilaterally Abdomen: soft, non-tender; bowel sounds normal; no masses,  no organomegaly Extremities: edema trace Wound: clean and dry  Rash: located on the right flank region with extension into back and his arm.  There is no evidence of rash along his upper torso or anywhere else on his body.  Lab Results: Recent Labs    07/25/17 0330 07/26/17 0258  WBC 9.1 9.3  HGB 9.8* 9.2*  HCT 29.9* 28.1*  PLT 99* 131*   BMET:  Recent Labs    07/25/17 0330 07/26/17 0258  NA 133* 132*  K 4.4 3.8  CL 103 97*  CO2 23 27  GLUCOSE 131* 121*  BUN 10 13  CREATININE 1.02 1.12  CALCIUM 7.9* 7.9*    PT/INR: No results for input(s): LABPROT, INR in the last 72 hours. ABG    Component Value Date/Time   PHART 7.345 (L)  07/23/2017 2050   HCO3 17.5 (L) 07/23/2017 2050   TCO2 23 07/24/2017 1842   ACIDBASEDEF 7.0 (H) 07/23/2017 2050   O2SAT 91.0 07/23/2017 2050   CBG (last 3)  Recent Labs    07/26/17 1701 07/26/17 2026 07/27/17 0615  GLUCAP 115* 144* 120*    Assessment/Plan: S/P Procedure(s) (LRB): CORONARY ARTERY BYPASS GRAFTING (CABG) x Five , using left internal mammary artery and right leg greater saphenous vein harvested endoscopically (N/A) TRANSESOPHAGEAL ECHOCARDIOGRAM (TEE) (N/A)  1. CV- Tachycardia improved- continue Lopressor, Cozaar, D/C EPW today 2. Pulm- no acute issues, off oxygen, 2V CXR yesterday with basilar atelectasis present, but aeration was improving 3. Renal- creatinine has been stable, weight is trending down, will continue Lasix for now, plan to stop at discharge and resume home HCTZ 4. Rash- this does not appear to be a contact rash, patient describes the rash as burning and itching... I am concerned due to location and path of rash this could be early shingles.. Will have Dr. Tyrone SageGerhardt review and if he agrees will start patient on Acyclovir  5. GI- constipation, he is passing gas, will give Lactulose today 6. DM- sugars are controlled 7. Dispo- patient stable, d/c EPW today, new rash concerning for early shingles.. Will review with Dr. Tyrone SageGerhardt, if he agrees is shingles, will start Acyclovir, lactulose for constipation,  if remains stable, home in AM   LOS: 6 days    [name] 07/27/2017  Rash poss shingles, medicine consult obtained they do not think is shinges, will observe for now I have seen and examined Gautam Gropp and agree with the above assessment  and plan.  [name] MD Beeper [phone] Office [phone] 07/27/2017 3:56 PM

## 2017-07-27 NOTE — Progress Notes (Signed)
Pt has walked x3 today. Doing well. Ed completed with pt and family. Daughter signed waiver to translate. [facility]  [phone] Ethelda ChickKristan Carolynne Schuchard CES, ACSM 1:54 PM 07/27/2017

## 2017-07-28 DIAGNOSIS — L239 Allergic contact dermatitis, unspecified cause: Secondary | ICD-10-CM

## 2017-07-28 LAB — GLUCOSE, CAPILLARY
GLUCOSE-CAPILLARY: 113 mg/dL — AB (ref 65–99)
Glucose-Capillary: 129 mg/dL — ABNORMAL HIGH (ref 65–99)
Glucose-Capillary: 113 mg/dL — ABNORMAL HIGH (ref 65–99)
Glucose-Capillary: 106 mg/dL — ABNORMAL HIGH (ref 65–99)

## 2017-07-28 MED ORDER — AMIODARONE HCL 200 MG PO TABS
400.0000 mg | ORAL_TABLET | Freq: Two times a day (BID) | ORAL | Status: DC
  Administered 2017-07-29 – 2017-07-30 (×4): 400 mg via ORAL
  Filled 2017-07-28 (×5): qty 2

## 2017-07-28 MED ORDER — METOPROLOL TARTRATE 25 MG PO TABS
37.5000 mg | ORAL_TABLET | Freq: Two times a day (BID) | ORAL | Status: DC
  Administered 2017-07-28: 37.5 mg via ORAL
  Filled 2017-07-28: qty 1

## 2017-07-28 MED ORDER — AMIODARONE HCL 200 MG PO TABS: 400.0000 mg | ORAL_TABLET | Freq: Every day | ORAL | Status: DC

## 2017-07-28 NOTE — Consult Note (Signed)
CONSULT NOTE    [name]  XBJ:[id number] DOB: Jan 11, 1961 DOA: 07/21/2017 PCP: Kenneth Kitty, PA-C   Outpatient Specialists:     Brief Narrative:  [name] is a 57 y.o. male with medical history significant for CAD status post in STEMI s/p CABG x5 for severe three-vessel obstructive disease, tolerating the procedure well, essential hypertension, hyperlipidemia, diabetes on chronic insulin, noted by TCT a to have pruritic rash in his back, bilateral flank since this morning.  There is no extension to the abdomen.  He denies any open vesicle areas, he denies any other areas of rash.  He applied moisturizer to the back, bleed good response, stating that he was feeling relief upon placing cream in the area.  He denies any pain in that rash.       Assessment & Plan:   Principal Problem:   NSTEMI (non-ST elevated myocardial infarction) (HCC) Active Problems:   S/P CABG x 5   Contact dermatitis   Lower thoracic rash consistent with contact dermatitis.  -Kenalog moderate strength cream qid to the rash area Avoid tape, may be allergic-- patient is on multiple new medications (amiodarone/plavix/levemir) so this will need to be watched Keep gown open as frequent as possible to allow air in the affected area  -per family, has not spread, there is some itching     Family Communication: At bedside     Subjective: Up walking with rehab Some itching on sides  Objective: Vitals:   07/27/17 1932 07/27/17 2334 07/28/17 0502 07/28/17 0934  BP: 135/73 120/67 116/73 136/72  Pulse: 91 83 87 95  Resp: (!) Temp: 99.1 F (37.3 C) 98.3 F (36.8 C) 98.3 F (36.8 C)   TempSrc: Oral Oral Oral   SpO2: 96% 99% 98%   Weight:   86.6 kg (191 lb)   Height:        Intake/Output Summary (Last 24 hours) at 07/28/2017 1212 Last data filed at 07/28/2017 0600 Gross per 24 hour  Intake 240 ml  Output -  Net 240 ml   Filed Weights   07/26/17 0415 07/27/17 0505 07/28/17  0502  Weight: 90.3 kg (199 lb 1.6 oz) 88.9 kg (196 lb) 86.6 kg (191 lb)    Examination:  General exam: Appears calm and comfortable  Skin: on back, both sides of the spine, blanchable red rash Psychiatry: Judgement and insight appear normal. Mood & affect appropriate.     Data Reviewed: I have personally reviewed following labs and imaging studies  CBC: Recent Labs  Lab 07/23/17 2001  07/24/17 0258 07/24/17 1811 07/24/17 1829 07/24/17 1842 07/24/17 2153 07/25/17 0330 07/26/17 0258  WBC 6.2  --  7.5 10.2  --   --   --  9.1 9.3  HGB 9.7*   < > 9.4* 10.6* 5.4* 10.9* 10.1* 9.8* 9.2*  HCT 28.9*   < > 28.4* 32.7* 16.0* 32.0* 30.8* 29.9* 28.1*  MCV 93.5  --  94.0 96.5  --   --   --  95.8 94.3  PLT 79*  --  89* 117*  --   --   --  99* 131*   < > = values in this interval not displayed.   Basic Metabolic Panel: Recent Labs  Lab 07/21/17 1818 07/22/17 0303 07/23/17 0146  07/23/17 2001  07/24/17 0258 07/24/17 1811 07/24/17 1829 07/24/17 1842 07/25/17 0330 07/26/17 0258  NA 137 136 136   < >  --    < > 137  --  146* 135 133* 132*  K 3.8 3.8 3.5   < >  --    < > 3.7  --  2.2* 4.3 4.4 3.8  CL 100* 102 104   < >  --    < > 109  --  118* 101 103 97*  CO2 --   --   --  20*  --   --   --  23 27  GLUCOSE 128* 134* 140*   < >  --    < > 108*  --  89 161* 131* 121*  BUN < >  --    < > 8  --  4* CREATININE 1.10 1.00 1.05   < > 0.93   < > 0.97 1.13 0.30* 1.00 1.02 1.12  CALCIUM 9.4 8.4* 8.7*  --   --   --  7.5*  --   --   --  7.9* 7.9*  MG 1.6*  --   --   --  2.8*  --  2.3 2.3  --   --   --   --   PHOS 3.6  --   --   --   --   --   --   --   --   --   --   --    < > = values in this interval not displayed.   GFR: Estimated Creatinine Clearance: 77.7 mL/min (by C-G formula based on SCr of 1.12 mg/dL). Liver Function Tests: Recent Labs  Lab 07/21/17 1818  AST 89*  ALT 33  ALKPHOS 82  BILITOT 1.1  PROT 7.6  ALBUMIN 4.3   No results for  input(s): LIPASE, AMYLASE in the last 168 hours. No results for input(s): AMMONIA in the last 168 hours. Coagulation Profile: Recent Labs  Lab 07/22/17 0303 07/22/17 2241 07/23/17 1410  INR 1.06 1.10 1.46   Cardiac Enzymes: Recent Labs  Lab 07/21/17 2152 07/22/17 0303 07/22/17 0942  TROPONINI 10.55* 18.94* 14.57*   BNP (last 3 results) No results for input(s): PROBNP in the last 8760 hours. HbA1C: No results for input(s): HGBA1C in the last 72 hours. CBG: Recent Labs  Lab 07/27/17 0615 07/27/17 1205 07/27/17 1629 07/27/17 2122 07/28/17 0611  GLUCAP 120* 151* 134* 120* 129*   Lipid Profile: No results for input(s): CHOL, HDL, LDLCALC, TRIG, CHOLHDL, LDLDIRECT in the last 72 hours. Thyroid Function Tests: No results for input(s): TSH, T4TOTAL, FREET4, T3FREE, THYROIDAB in the last 72 hours. Anemia Panel: No results for input(s): VITAMINB12, FOLATE, FERRITIN, TIBC, IRON, RETICCTPCT in the last 72 hours. Urine analysis:    Component Value Date/Time   COLORURINE YELLOW 07/23/2017 0207   APPEARANCEUR CLEAR 07/23/2017 0207   LABSPEC 1.009 07/23/2017 0207   PHURINE 6.0 07/23/2017 0207   GLUCOSEU NEGATIVE 07/23/2017 0207   HGBUR NEGATIVE 07/23/2017 0207   BILIRUBINUR NEGATIVE 07/23/2017 0207   KETONESUR NEGATIVE 07/23/2017 0207   PROTEINUR NEGATIVE 07/23/2017 0207   UROBILINOGEN 1.0 11/01/2009 0012   NITRITE NEGATIVE 07/23/2017 0207   LEUKOCYTESUR NEGATIVE 07/23/2017 0207   Sepsis Labs: (procalcitonin:4,lacticidven:4)  ) Recent Results (from the past 240 hour(s))  MRSA PCR Screening     Status: None   Collection Time: 07/22/17  2:08 AM  Result Value Ref Range Status   MRSA by PCR NEGATIVE NEGATIVE Final    Comment:        The GeneXpert MRSA Assay (FDA approved for NASAL  specimens only), is one component of a comprehensive MRSA colonization surveillance program. It is not intended to diagnose MRSA infection nor to guide or monitor treatment  for MRSA infections. Performed at [facility], [address], [place], [place] [address]   Surgical pcr screen     Status: None   Collection Time: 07/23/17  1:10 AM  Result Value Ref Range Status   MRSA, PCR NEGATIVE NEGATIVE Final   Staphylococcus aureus NEGATIVE NEGATIVE Final    Comment: (NOTE) The Xpert SA Assay (FDA approved for NASAL specimens in patients 43 years of age and older), is one component of a comprehensive surveillance program. It is not intended to diagnose infection nor to guide or monitor treatment. Performed at [facility], [address], [place], [place] [address]       Anti-infectives (From admission, onward)   Start     Dose/Rate Route Frequency Ordered Stop   07/27/17 1000  valACYclovir (VALTREX) tablet 1,000 mg  Status:  Discontinued     1,000 mg Oral 3 times daily 07/27/17 0909 07/27/17 1052   07/23/17 1945  vancomycin (VANCOCIN) IVPB 1000 mg/200 mL premix     1,000 mg 200 mL/hr over 60 Minutes Intravenous  Once 07/23/17 1347 07/23/17 2045   07/23/17 1545  cefUROXime (ZINACEF) 1.5 g in sodium chloride 0.9 % 100 mL IVPB     1.5 g 200 mL/hr over 30 Minutes Intravenous Every 12 hours 07/23/17 1347 07/25/17 0343   07/23/17 0400  vancomycin (VANCOCIN) 1,250 mg in sodium chloride 0.9 % 250 mL IVPB     1,250 mg 166.7 mL/hr over 90 Minutes Intravenous To Surgery 07/22/17 2003 07/23/17 0830   07/23/17 0400  cefUROXime (ZINACEF) 1.5 g in sodium chloride 0.9 % 100 mL IVPB     1.5 g 200 mL/hr over 30 Minutes Intravenous To Surgery 07/22/17 2003 07/23/17 0835   07/23/17 0400  cefUROXime (ZINACEF) 750 mg in sodium chloride 0.9 % 100 mL IVPB  Status:  Discontinued     750 mg 200 mL/hr over 30 Minutes Intravenous To Surgery 07/22/17 2003 07/23/17 1347       Radiology Studies: No results found.      Scheduled Meds: . [START ON 07/29/2017] amiodarone  400 mg Oral Q12H   Followed by  . [START ON 08/05/2017] amiodarone  400 mg Oral Daily  .  aspirin EC  81 mg Oral Daily  . atorvastatin  80 mg Oral q1800  . clopidogrel  75 mg Oral Daily  . docusate sodium  200 mg Oral Daily  . enoxaparin (LOVENOX) injection  30 mg Subcutaneous Q24H  . insulin aspart  0-24 Units Subcutaneous TID AC & HS  . insulin detemir  10 Units Subcutaneous Daily  . losartan  100 mg Oral Daily  . mouth rinse  15 mL Mouth Rinse BID  . metFORMIN  1,000 mg Oral BID WC  . metoprolol tartrate  37.5 mg Oral BID  . pantoprazole  40 mg Oral QAC breakfast  . potassium chloride  40 mEq Oral Daily  . sodium chloride flush  3 mL Intravenous Q12H  . triamcinolone   Topical QID   Continuous Infusions: . sodium chloride Stopped (07/25/17 1120)     LOS: 7 days    Time spent: 25 min    Joseph Art, DO [facility] Pager [phone]  If 7PM-7AM, please contact night-coverage [email or web address] Password [email or web address] 07/28/2017, 12:12 PM

## 2017-07-28 NOTE — Progress Notes (Signed)
CARDIAC REHAB PHASE I   PRE:  Rate/Rhythm: 98 SR  BP:  Sitting: 132/90      SaO2: 99% RA   MODE:  Ambulation: 470 ft   POST:  Rate/Rhythm: 110 ST  BP:  Sitting: 141/78      SaO2: 98% RA   Pt ambulated 470 ft independently. This was pt's second walk today. Pt denied any complaints of CP, SOB or dizziness.  Pt returned to recliner with call bell within reach. Pt's daughter and wife at bedside.   [id number]   York Cerise MS, ACSM CEP  10:36 AM 07/28/2017

## 2017-07-28 NOTE — Plan of Care (Signed)

## 2017-07-28 NOTE — Progress Notes (Addendum)
[address]       [place] [address]             [phone]      5 Days Post-Op Procedure(s) (LRB): CORONARY ARTERY BYPASS GRAFTING (CABG) x Five , using left internal mammary artery and right leg greater saphenous vein harvested endoscopically (N/A) TRANSESOPHAGEAL ECHOCARDIOGRAM (TEE) (N/A) Subjective: Interpretor used during my interview this morning. I explained about the A. Fib overnight and increasing his lopressor.  He says he did have a bowel movement with the medication given yesterday. There were no issues with the wires being pulled yesterday. All questions were answered.   Objective: Vital signs in last 24 hours: Temp:  [98.3 F (36.8 C)-99.1 F (37.3 C)] 98.3 F (36.8 C) (04/27 0502) Pulse Rate:  [83-93] 87 (04/27 0502) Cardiac Rhythm: Normal sinus rhythm (04/27 0532) Resp:  [14-21] 14 (04/27 0502) BP: (116-135)/(65-77) 116/73 (04/27 0502) SpO2:  [96 %-99 %] 98 % (04/27 0502) Weight:  [191 lb (86.6 kg)] 191 lb (86.6 kg) (04/27 0502)    Intake/Output from previous day: 04/26 0701 - 04/27 0700 In: 240 [P.O.:240] Out: -  Intake/Output this shift: No intake/output data recorded.  General appearance: alert, cooperative and no distress Heart: regular rate and rhythm, S1, S2 normal, no murmur, click, rub or gallop Lungs: clear to auscultation bilaterally Abdomen: soft, non-tender; bowel sounds normal; no masses,  no organomegaly Extremities: 1-2+ pitting edema in his lower extremity Wound: clean and dry EVH and sternotomy site.   Lab Results: Recent Labs    07/26/17 0258  WBC 9.3  HGB 9.2*  HCT 28.1*  PLT 131*   BMET:  Recent Labs    07/26/17 0258  NA 132*  K 3.8  CL 97*  CO2 27  GLUCOSE 121*  BUN 13  CREATININE 1.12  CALCIUM 7.9*    PT/INR: No results for input(s): LABPROT, INR in the last 72 hours. ABG    Component Value Date/Time   PHART 7.345 (L) 07/23/2017 2050   HCO3 17.5 (L) 07/23/2017 2050   TCO2 23  07/24/2017 1842   ACIDBASEDEF 7.0 (H) 07/23/2017 2050   O2SAT 91.0 07/23/2017 2050   CBG (last 3)  Recent Labs    07/27/17 1629 07/27/17 2122 07/28/17 0611  GLUCAP 134* 120* 129*    Assessment/Plan: S/P Procedure(s) (LRB): CORONARY ARTERY BYPASS GRAFTING (CABG) x Five , using left internal mammary artery and right leg greater saphenous vein harvested endoscopically (N/A) TRANSESOPHAGEAL ECHOCARDIOGRAM (TEE) (N/A)  1. CV-NSR this morning but short runs of atrial fibrillation with a rate of 120 to 140 bpm last night.   The patient then spontaneously converted back to normal sinus rhythm.  I have increased his Lopressor to 37.5 mg twice daily.  Continue Cozaar.  EPW discontinued.  Monitor the patient's rhythm for 1 more day. 2.  Pulm-patient is tolerating room air with excellent oxygenation.  No new CXR to review. 3.  Renal-last creatinine was 1.12.  Electrolytes okay.  Weight is trending down.  Appears to be about 7 pounds over his baseline weight.  Continue Lasix for diuresis. 4.  H&H remains stable.  Platelets continue to trend up. 5.  GI-constipation, passing gas, continue lactulose. 6. DM- blood glucose level well controlled 7.  Rash-consult placed to the medicine service and they do not believe that it is shingles.  Will observe for now and continue Kenalog cream.  Plan: Observe rhythm for another 24 hours. If remains stable will discharge  tomorrow. Ambulate around the unit and work on incentive spirometer.     LOS: 7 days    Sharlene Dory 07/28/2017  Brief afib when I was seeing the patient  Start po Cordarone  hold d/c  I have seen and examined [name] and agree with the above assessment  and plan.  Kenneth Ovens MD Beeper [phone] Office [phone] 07/28/2017 12:08 PM

## 2017-07-28 NOTE — Progress Notes (Signed)
Amiodarone Drug - Drug Interaction Consult Note  Recommendations: Monitor vital signs- no changes at this time.   Amiodarone is metabolized by the cytochrome P450 system and therefore has the potential to cause many drug interactions. Amiodarone has an average plasma half-life of 50 days (range 20 to 100 days).   There is potential for drug interactions to occur several weeks or months after stopping treatment and the onset of drug interactions may be slow after initiating amiodarone.    Statins: Increased risk of myopathy. Simvastatin- restrict dose to  daily. Other statins: counsel patients to report any muscle pain or weakness immediately.   Anticoagulants: Amiodarone can increase anticoagulant effect. Consider warfarin dose reduction. Patients should be monitored closely and the dose of anticoagulant altered accordingly, remembering that amiodarone levels take several weeks to stabilize.   Antiepileptics: Amiodarone can increase plasma concentration of phenytoin, the dose should be reduced. Note that small changes in phenytoin dose can result in large changes in levels. Monitor patient and counsel on signs of toxicity.   Beta blockers: increased risk of bradycardia, AV block and myocardial depression. Sotalol - avoid concomitant use.    Calcium channel blockers (diltiazem and verapamil): increased risk of bradycardia, AV block and myocardial depression.    Cyclosporine: Amiodarone increases levels of cyclosporine. Reduced dose of cyclosporine is recommended.   Digoxin dose should be halved when amiodarone is started.   Diuretics: increased risk of cardiotoxicity if hypokalemia occurs.   Oral hypoglycemic agents (glyburide, glipizide, glimepiride): increased risk of hypoglycemia. Patient's glucose levels should be monitored closely when initiating amiodarone therapy.    Drugs that prolong the QT interval:  Torsades de pointes risk may be increased with  concurrent use - avoid if possible.  Monitor QTc, also keep magnesium/potassium WNL if concurrent therapy can't be avoided. Marland Kitchen Antibiotics: e.g. fluoroquinolones, erythromycin. . Antiarrhythmics: e.g. quinidine, procainamide, disopyramide, sotalol. . Antipsychotics: e.g. phenothiazines, haloperidol.  . Lithium, tricyclic antidepressants, and methadone.  Thank You,  Girard Cooter, PharmD Clinical Pharmacist  Pager: [phone] Clinical Phone for 07/28/2017 until 3:30pm: [phone] If after 3:30pm, please call main pharmacy at [phone] 07/28/2017 12:15 PM

## 2017-07-28 NOTE — Progress Notes (Signed)
Pt has brief period of a fib RVR at 1648, with HR at 160.  Pt was asymptomatic and self resolved to NSR. VS stable and will continue to monitor.

## 2017-07-29 LAB — MAGNESIUM: Magnesium: 2.1 mg/dL (ref 1.7–2.4)

## 2017-07-29 LAB — BASIC METABOLIC PANEL
Anion gap: 8 (ref 5–15)
BUN: 12 mg/dL (ref 6–20)
CO2: 27 mmol/L (ref 22–32)
Calcium: 8.6 mg/dL — ABNORMAL LOW (ref 8.9–10.3)
Chloride: 101 mmol/L (ref 101–111)
Creatinine, Ser: 0.94 mg/dL (ref 0.61–1.24)
GFR calc Af Amer: >60 mL/min (ref >60)
GFR calc non Af Amer: >60 mL/min (ref >60)
Glucose, Bld: 117 mg/dL — ABNORMAL HIGH (ref 65–99)
Potassium: 4 mmol/L (ref 3.5–5.1)
Sodium: 136 mmol/L (ref 135–145)

## 2017-07-29 LAB — GLUCOSE, CAPILLARY
GLUCOSE-CAPILLARY: 136 mg/dL — AB (ref 65–99)
GLUCOSE-CAPILLARY: 96 mg/dL (ref 65–99)
Glucose-Capillary: 139 mg/dL — ABNORMAL HIGH (ref 65–99)
Glucose-Capillary: 135 mg/dL — ABNORMAL HIGH (ref 65–99)

## 2017-07-29 LAB — TSH: TSH: 5.572 u[IU]/mL — ABNORMAL HIGH (ref 0.350–4.500)

## 2017-07-29 MED ORDER — METOPROLOL TARTRATE 50 MG PO TABS
50.0000 mg | ORAL_TABLET | Freq: Two times a day (BID) | ORAL | Status: DC
  Administered 2017-07-29 – 2017-07-30 (×3): 50 mg via ORAL
  Filled 2017-07-29 (×3): qty 1

## 2017-07-29 NOTE — Consult Note (Addendum)
[facility] TEAM 1 - Stepdown/ICU TEAM CONSULT F/U NOTE  [name] UJW:[id number] DOB: 22-Nov-1960 DOA: 07/21/2017 PCP: Sandre Kitty, PA-C  Admit HPI / Brief Narrative: 57 y.o.malewith CADs/p STEMIand now s/p CABGx5, HTN, HLD, and DM who was noted by TCTS to have pruritic rash on his back on 4/26. He denied any vesicles.   HPI/Subjective: Pt reports that his rash is improving.  He denies pruritis at this time.  He o/w has no complaints.   Recommendations/Plan:  Macular and maculopapular rash On exam today the rash appears c/w either a drug rash, or contact dermatitis - given that the rash is limited to his back, I suspect it is a reaction to our linens, detergent, or even the gown he is wearing - nonetheless, he reports that it is no longer itching, and is therefore likely resolving - no change in current tx suggested   Moderately elevated TSH In the setting of recent heart surgery and afib/RVR I would not pursue this at this time - he should have a f/u TSH w/ FT4 and FT3 as an outpt in 6-8 weeks (or sooner if he develops sx of severe hypothyroidism)  TRH will sign off at this time.  Please do not hesitate to call if there is anything else we can contribute to the care of this gentleman.    Antibiotics: none  Objective: Blood pressure 128/76, pulse 80, temperature 98.3 F (36.8 C), temperature source Oral, resp. rate 18, height  (1.676 m), weight 85.5 kg (188 lb 6.4 oz), SpO2 94 %.  Intake/Output Summary (Last 24 hours) at 07/29/2017 1013 Last data filed at 07/28/2017 1600 Gross per 24 hour  Intake 480 ml  Output -  Net 480 ml    Exam: General: No acute respiratory distress Cutaneous:  Diffuse patches of macular and less prominent maculopapular rash across 90% of back - no apparent rash of chest/abdom/arms - no broken skin - no oozing or sloughing - no bullae or vesicles   Data Reviewed: Basic Metabolic Panel: Recent Labs  Lab 07/23/17 0146  07/23/17 2001   07/24/17 0258 07/24/17 1811 07/24/17 1829 07/24/17 1842 07/25/17 0330 07/26/17 0258 07/29/17 0254  NA 136   < >  --    < > 137  --  146* 135 133* 132* 136  K 3.5   < >  --    < > 3.7  --  2.2* 4.3 4.4 3.8 4.0  CL 104   < >  --    < > 109  --  118* 101 103 97* 101  CO2 24  --   --   --  20*  --   --   --  GLUCOSE 140*   < >  --    < > 108*  --  89 161* 131* 121* 117*  BUN 10   < >  --    < > 8  --  4* CREATININE 1.05   < > 0.93   < > 0.97 1.13 0.30* 1.00 1.02 1.12 0.94  CALCIUM 8.7*  --   --   --  7.5*  --   --   --  7.9* 7.9* 8.6*  MG  --   --  2.8*  --  2.3 2.3  --   --   --   --  2.1   < > = values in this interval not displayed.    CBC: Recent  Labs  Lab 07/23/17 2001  07/24/17 0258 07/24/17 1811 07/24/17 1829 07/24/17 1842 07/24/17 2153 07/25/17 0330 07/26/17 0258  WBC 6.2  --  7.5 10.2  --   --   --  9.1 9.3  HGB 9.7*   < > 9.4* 10.6* 5.4* 10.9* 10.1* 9.8* 9.2*  HCT 28.9*   < > 28.4* 32.7* 16.0* 32.0* 30.8* 29.9* 28.1*  MCV 93.5  --  94.0 96.5  --   --   --  95.8 94.3  PLT 79*  --  89* 117*  --   --   --  99* 131*   < > = values in this interval not displayed.    Liver Function Tests: No results for input(s): AST, ALT, ALKPHOS, BILITOT, PROT, ALBUMIN in the last 168 hours. No results for input(s): LIPASE, AMYLASE in the last 168 hours. No results for input(s): AMMONIA in the last 168 hours.  Coags: Recent Labs  Lab 07/22/17 2241 07/23/17 1410  INR 1.10 1.46   Recent Labs  Lab 07/22/17 2241 07/23/17 1410  APTT 126* 32    Cardiac Enzymes: No results for input(s): CKTOTAL, CKMB, CKMBINDEX, TROPONINI in the last 168 hours.  CBG: Recent Labs  Lab 07/28/17 0611 07/28/17 1207 07/28/17 1622 07/28/17 2122 07/29/17 0603  GLUCAP 129* 113* 113* 106* 136*    Recent Results (from the past 240 hour(s))  MRSA PCR Screening     Status: None   Collection Time: 07/22/17  2:08 AM  Result Value Ref Range Status   MRSA by PCR NEGATIVE  NEGATIVE Final    Comment:        The GeneXpert MRSA Assay (FDA approved for NASAL specimens only), is one component of a comprehensive MRSA colonization surveillance program. It is not intended to diagnose MRSA infection nor to guide or monitor treatment for MRSA infections. Performed at [facility], [address] [place], [place] [address]   Surgical pcr screen     Status: None   Collection Time: 07/23/17  1:10 AM  Result Value Ref Range Status   MRSA, PCR NEGATIVE NEGATIVE Final   Staphylococcus aureus NEGATIVE NEGATIVE Final    Comment: (NOTE) The Xpert SA Assay (FDA approved for NASAL specimens in patients 37 years of age and older), is one component of a comprehensive surveillance program. It is not intended to diagnose infection nor to guide or monitor treatment. Performed at [facility], [address], [place], [place] [address]      Studies:   Recent x-ray studies have been reviewed in detail by the Attending Physician  Scheduled Meds:  Scheduled Meds: . amiodarone  400 mg Oral Q12H   Followed by  . [START ON 08/05/2017] amiodarone  400 mg Oral Daily  . aspirin EC  81 mg Oral Daily  . atorvastatin  80 mg Oral q1800  . clopidogrel  75 mg Oral Daily  . docusate sodium  200 mg Oral Daily  . enoxaparin (LOVENOX) injection  30 mg Subcutaneous Q24H  . insulin aspart  0-24 Units Subcutaneous TID AC & HS  . insulin detemir  10 Units Subcutaneous Daily  . losartan  100 mg Oral Daily  . mouth rinse  15 mL Mouth Rinse BID  . metFORMIN  1,000 mg Oral BID WC  . metoprolol tartrate  50 mg Oral BID  . pantoprazole  40 mg Oral QAC breakfast  . potassium chloride  40 mEq Oral Daily  . sodium chloride flush  3  mL Intravenous Q12H  . triamcinolone   Topical QID    [name] , MD   [facility] Office  [phone] Pager - Text Page per Amion as per below:  On-Call/Text Page:      [email or web address]      password [email or web address]  If 7PM-7AM, please  contact night-coverage [email or web address] Password [email or web address] 07/29/2017, 10:13 AM   LOS: 8 days

## 2017-07-29 NOTE — Progress Notes (Addendum)
[address]       [place] [address]             [phone]      6 Days Post-Op Procedure(s) (LRB): CORONARY ARTERY BYPASS GRAFTING (CABG) x Five , using left internal mammary artery and right leg greater saphenous vein harvested endoscopically (N/A) TRANSESOPHAGEAL ECHOCARDIOGRAM (TEE) (N/A) Subjective: Family member translated this morning. Patient is doing well but he is hot in his room due to the direct sunlight. He is requesting changing rooms.   Objective: Vital signs in last 24 hours: Temp:  [98 F (36.7 C)-99.4 F (37.4 C)] 98.3 F (36.8 C) (04/28 0416) Pulse Rate:  [79-97] 80 (04/28 0416) Cardiac Rhythm: Normal sinus rhythm (04/28 0700) Resp:  [12-28] 18 (04/28 0605) BP: (119-147)/(66-84) 128/76 (04/28 0416) SpO2:  [94 %-99 %] 94 % (04/28 0416) Weight:  [188 lb 6.4 oz (85.5 kg)] 188 lb 6.4 oz (85.5 kg) (04/28 0605)     Intake/Output from previous day: 04/27 0701 - 04/28 0700 In: 600 [P.O.:600] Out: -  Intake/Output this shift: No intake/output data recorded.  General appearance: alert, cooperative and no distress Heart: regular rate and rhythm, S1, S2 normal, no murmur, click, rub or gallop Lungs: clear to auscultation bilaterally Abdomen: soft, non-tender; bowel sounds normal; no masses,  no organomegaly Extremities: 1+ pitting pedal edema Wound: clean and dry  Lab Results: No results for input(s): WBC, HGB, HCT, PLT in the last 72 hours. BMET:  Recent Labs    07/29/17 0254  NA 136  K 4.0  CL 101  CO2 27  GLUCOSE 117*  BUN 12  CREATININE 0.94  CALCIUM 8.6*    PT/INR: No results for input(s): LABPROT, INR in the last 72 hours. ABG    Component Value Date/Time   PHART 7.345 (L) 07/23/2017 2050   HCO3 17.5 (L) 07/23/2017 2050   TCO2 23 07/24/2017 1842   ACIDBASEDEF 7.0 (H) 07/23/2017 2050   O2SAT 91.0 07/23/2017 2050   CBG (last 3)  Recent Labs    07/28/17 1622 07/28/17 2122 07/29/17 0603  GLUCAP 113* 106* 136*      Assessment/Plan: S/P Procedure(s) (LRB): CORONARY ARTERY BYPASS GRAFTING (CABG) x Five , using left internal mammary artery and right leg greater saphenous vein harvested endoscopically (N/A) TRANSESOPHAGEAL ECHOCARDIOGRAM (TEE) (N/A)  1. CV-BP well controlled. NSR in the 70s-80s this morning but short runs of atrial fibrillation with a rate of 120 to 140 with RVR bpm last night around 1am and 2am.   The patient then spontaneously converted back to normal sinus rhythm. Increase Lopressor to 50 mg twice daily.  Continue Cozaar.  EPW discontinued.  Mag is 2.1 and Potassium is 4.0 2.  Pulm-patient is tolerating room air with excellent oxygenation.  No new CXR to review. 3.  Renal-last creatinine was 0.94.  Electrolytes okay.  Weight is trending down.  Appears to be about 4 pounds over his baseline weight.  Continue Lasix for diuresis. 4.  H&H remains stable.  Platelets continue to trend up. 5.  GI-constipation resolved, passing gas, continue lactulose. 6. DM- blood glucose level well controlled 7.  Rash-consult placed to the medicine service and they do not believe that it is shingles.  Will observe for now and continue Kenalog cream.  Plan: Increased Lopressor to  BID. Continue to monitor rhythm another day.      LOS: 8 days    Sharlene Dory 07/29/2017  Had short runs of afib at  1 am Now sinus on po beta blocker and cordrone Observe another 24 hours if rhythm stable consider home I have seen and examined Sten Lyford and agree with the above assessment  and plan.  [name] MD Beeper [phone] Office [phone] 07/29/2017 12:26 PM

## 2017-07-29 NOTE — Plan of Care (Signed)
  Problem: Education: Goal: Knowledge of General Education information will improve Outcome: Progressing   Problem: Health Behavior/Discharge Planning: Goal: Ability to manage health-related needs will improve Outcome: Progressing   Problem: Clinical Measurements: Goal: Ability to maintain clinical measurements within normal limits will improve Outcome: Progressing Goal: Will remain free from infection Outcome: Progressing Goal: Diagnostic test results will improve Outcome: Progressing Goal: Respiratory complications will improve Outcome: Progressing Goal: Cardiovascular complication will be avoided Outcome: Progressing   Problem: Safety: Goal: Ability to remain free from injury will improve Outcome: Progressing

## 2017-07-30 DIAGNOSIS — I4891 Unspecified atrial fibrillation: Secondary | ICD-10-CM

## 2017-07-30 LAB — GLUCOSE, CAPILLARY
GLUCOSE-CAPILLARY: 113 mg/dL — AB (ref 65–99)
GLUCOSE-CAPILLARY: 114 mg/dL — AB (ref 65–99)

## 2017-07-30 LAB — BASIC METABOLIC PANEL
Anion gap: 7 (ref 5–15)
BUN: 15 mg/dL (ref 6–20)
CO2: 26 mmol/L (ref 22–32)
Calcium: 8.9 mg/dL (ref 8.9–10.3)
Chloride: 103 mmol/L (ref 101–111)
Creatinine, Ser: 1.11 mg/dL (ref 0.61–1.24)
GFR calc Af Amer: >60 mL/min (ref >60)
GFR calc non Af Amer: >60 mL/min (ref >60)
Glucose, Bld: 117 mg/dL — ABNORMAL HIGH (ref 65–99)
Potassium: 4.8 mmol/L (ref 3.5–5.1)
Sodium: 136 mmol/L (ref 135–145)

## 2017-07-30 MED ORDER — METOPROLOL TARTRATE 50 MG PO TABS: 50.0000 mg | ORAL_TABLET | Freq: Two times a day (BID) | ORAL | 3 refills | Status: DC

## 2017-07-30 MED ORDER — ASPIRIN 81 MG PO TBEC: 81.0000 mg | DELAYED_RELEASE_TABLET | Freq: Every day | ORAL | Status: DC

## 2017-07-30 MED ORDER — APIXABAN 5 MG PO TABS: 5.0000 mg | ORAL_TABLET | Freq: Two times a day (BID) | ORAL | 1 refills | Status: DC

## 2017-07-30 MED ORDER — AMIODARONE HCL 400 MG PO TABS: 400.0000 mg | ORAL_TABLET | Freq: Two times a day (BID) | ORAL | 1 refills | Status: DC

## 2017-07-30 MED ORDER — TRIAMCINOLONE ACETONIDE 0.025 % EX CREA: TOPICAL_CREAM | Freq: Four times a day (QID) | CUTANEOUS | 0 refills | Status: DC

## 2017-07-30 MED ORDER — APIXABAN 5 MG PO TABS: 5.0000 mg | ORAL_TABLET | Freq: Two times a day (BID) | ORAL | Status: DC

## 2017-07-30 MED ORDER — TRAMADOL HCL 50 MG PO TABS: 50.0000 mg | ORAL_TABLET | ORAL | 0 refills | Status: DC | PRN

## 2017-07-30 MED ORDER — ATORVASTATIN CALCIUM 80 MG PO TABS: 80.0000 mg | ORAL_TABLET | Freq: Every day | ORAL | 3 refills | Status: DC

## 2017-07-30 MED ORDER — CLOPIDOGREL BISULFATE 75 MG PO TABS: 75.0000 mg | ORAL_TABLET | Freq: Every day | ORAL | 1 refills | Status: DC

## 2017-07-30 NOTE — Discharge Instructions (Signed)
Injerto de revascularizacin arterial coronaria (Coronary Artery Bypass Grafting) El injerto de revascularizacin arterial coronaria (IRAC) es una ciruga que se realiza cuando las arterias del corazn se han estrechado u obstruido con una acumulacin de Bennett, Maureen Ralphs. Estas arterias suministran el oxgeno y los nutrientes que el corazn necesita para bombear la sangre a todo el organismo. Durante el injerto de revascularizacin coronaria, se retira una seccin de un vaso sanguneo de otra parte del cuerpo y se la coloca donde se encuentra el estrechamiento o la obstruccin. ANTES DEL PROCEDIMIENTO  [employer] medicamentos como le indic el mdico. Es posible que le indiquen que comience a tomar medicamentos nuevos o suspenda otros. No suspenda los medicamentos ni tome dosis diferentes por su cuenta.  No coma ni beba nada despus de la medianoche anterior a la ciruga, o segn se lo haya indicado el mdico. Pregntele al mdico si puede tomar los medicamentos que necesita con un sorbo de [place].  PROCEDIMIENTO [employer] de realizar [place]. Ciruga tradicional abierta  Le administrarn un medicamento que lo har dormir (anestesia general) durante la ciruga.  Una vez que se haya dormido, se realiza un corte (incisin) en la parte frontal del trax hasta el esternn. Se abre el esternn de modo que se pueda ver el corazn.  Luego lo colocarn en una mquina de circulacin extracorprea. Esta mquina suministra oxgeno a Wellsite geologist se trabaja en el corazn.  A continuacin, se detiene el corazn por un lapso de [employer]. [employer], el cirujano puede dar los pasos siguientes. ? Se puede extraer parte de una vena de la pierna y usarse para eludir Occupational psychologist) las arterias del corazn que estn obstruidas. Algunas veces, se utilizan ciertas partes de una arteria del interior de la pared torcica o del brazo. ? Cuando la revascularizacin haya finalizado, se lo sacar de la  mquina. ? Se vuelve a poner en funcionamiento el corazn, que empezar a Industrial/product designer. ? Se cierra la cavidad alrededor del corazn.  Se le cerrar el pecho con suturas o grapas.  Tendr tubos de drenaje torcico que se conectan a un dispositivo de succin. Este dispositivo extraer el lquido y lo expandir nuevamente a los pulmones. Ciruga mnimamente invasiva Esta ciruga se realiza a travs de un corte en el lado izquierdo del trax. Es posible que el cirujano no tenga que detener el corazn ni enlentecer su funcionamiento. DESPUS DEL PROCEDIMIENTO  Lo llevarn al rea de recuperacin donde lo vigilarn.  Tal vez se despierte con un tubo en la garganta que le facilitar la respiracin. Podr estar conectado a Advertising account planner. No podr hablar mientras tenga colocado el tubo, que se retira cuando es seguro [place].  Tal vez est atontado y un poco dolorido. Se le administrarn medicamentos para Engineer, materials.  Esta informacin no tiene Theme park manager el consejo del mdico. Asegrese de hacerle al mdico cualquier pregunta que tenga. Document Released: 03/25/2013 Document Revised: 03/25/2013 Document Reviewed: 08/27/2012 Elsevier Interactive Patient Education  2017 Elsevier Inc.  Discharge Instructions:  1. You may shower, please wash incisions daily with soap and water and keep dry.  If you wish to cover wounds with dressing you may do so but please keep clean and change daily.  No tub baths or swimming until incisions have completely healed.  If your incisions become red or develop any drainage please call our office at [phone]  2. No Driving until cleared by Dr. Dennie Maizes office and you are no longer  using narcotic pain medications  3. Monitor your weight daily.. Please use the same scale and weigh at same time... If you gain 5-10 lbs in 48 hours with associated lower extremity swelling, please contact our office at [phone]  4. Fever of 101.5 for at least 24  hours with no source, please contact our office at [phone]  5. Activity- up as tolerated, please walk at least 3 times per day.  Avoid strenuous activity, no lifting, pushing, or pulling with your arms over 8-10 lbs for a minimum of 6 weeks  6. If any questions or concerns arise, please do not hesitate to contact our office at [phone]

## 2017-07-30 NOTE — Progress Notes (Signed)
Per insurance check on Eliquis  # 5.  [facility] @ [facility] # [phone]     1. ELIQUIS 5 MG BID  COVER- YES  CO-PAY- $ 321.94  TIER- 3 DRUG  PRIOR APPROVAL- NO    2. ELIQUIS  2.5 MG BID  COVER- YES  CO-PAY- $ 321.93  TIER- 3 DRUG  PRIOR APPROVAL- NO    DEDUCTIBLE - NOT MET   PREFERRED PHARMACY :[facility], [facility], [facility] AND [facility]

## 2017-07-30 NOTE — Progress Notes (Signed)
Discharge instructions reviewed with patient and Significant other along with medication list and follow up appointments. This all reviewed with interpreter and instructions given to patient In spanish. All questions were answered IV and tele dcd will discharge home as ordered. Tiwana Chavis, Randall An RN

## 2017-07-30 NOTE — Care Management Note (Signed)
Case Management Note Marvetta Gibbons RN, BSN Unit 4E-Case Manager-- [place] coverage [phone]  Patient Details  Name: [name] MRN: [id number] Date of Birth: 01-21-1961  Subjective/Objective: Pt admitted with NSTEMI- s/p CABGx5 on 07/23/17                 Action/Plan: PTA Pt lived at home with spouse- anticipate return home- CM to follow for transition of care needs  Expected Discharge Date:  07/30/17               Expected Discharge Plan:  Home/Self Care  In-House Referral:  NA  Discharge planning Services  CM Consult, Medication Assistance  Post Acute Care Choice:  Durable Medical Equipment Choice offered to:  Patient  DME Arranged:  Gilford Rile rolling DME Agency:  [place] Arranged:  NA [place] Agency:     Status of Service:  Completed, signed off  If discussed at [place] of Stay Meetings, dates discussed:    Discharge Disposition: home/self care   Additional Comments:  07/30/17- 1530- Marvetta Gibbons RN, CM- pt for d/c home today with wife- RW has been delivered to room by [facility]- pt to be started on Eliquis- per insurance check pt has not met deductible- drug cost in tier 3- spoke with pt and wife via [place] interpreter at bedside- explained eliquis cost and deductible situation- pt provided 30 day free card to use on discharge- and encouraged to f/u with cards office if deductible still not met within 30 days and drug cost remains high. No further CM needs noted for transition home.   07/27/17- 1145- Kele Withem RN, CM- per cardiac rehab pt will need RW for home- DME order has been placed and notified Butch Penny with [facility] for DME RW need- RW to be delivered to room prior to discharge.   Dawayne Patricia, RN 07/30/2017, 4:05 PM

## 2017-07-30 NOTE — Progress Notes (Addendum)
      [address]       [place] [address]             [phone]      7 Days Post-Op Procedure(s) (LRB): CORONARY ARTERY BYPASS GRAFTING (CABG) x Five , using left internal mammary artery and right leg greater saphenous vein harvested endoscopically (N/A) TRANSESOPHAGEAL ECHOCARDIOGRAM (TEE) (N/A)   Subjective:  Translation provided by interpreter services.  He has no new complaints.  He continues to have chest discomfort.   Objective: Vital signs in last 24 hours: Temp:  [97.5 F (36.4 C)-98.4 F (36.9 C)] 97.5 F (36.4 C) (04/29 0525) Pulse Rate:  [67-98] 67 (04/29 0525) Cardiac Rhythm: Normal sinus rhythm (04/29 0353) Resp:  [18-24] 18 (04/29 0525) BP: (92-143)/(65-83) 115/77 (04/29 0525) SpO2:  [92 %-100 %] 100 % (04/29 0525) Weight:  [186 lb 4.8 oz (84.5 kg)] 186 lb 4.8 oz (84.5 kg) (04/29 0525)  Intake/Output from previous day: 04/28 0701 - 04/29 0700 In: 672 [P.O.:672] Out: -   General appearance: alert, cooperative and no distress Heart: regular rate and rhythm Lungs: clear to auscultation bilaterally Abdomen: soft, non-tender; bowel sounds normal; no masses,  no organomegaly Extremities: edema trace Wound: clean and dry  Lab Results: No results for input(s): WBC, HGB, HCT, PLT in the last 72 hours. BMET:  Recent Labs    07/29/17 0254 07/30/17 0314  NA 136 136  K 4.0 4.8  CL 101 103  CO2 27 26  GLUCOSE 117* 117*  BUN 12 15  CREATININE 0.94 1.11  CALCIUM 8.6* 8.9    PT/INR: No results for input(s): LABPROT, INR in the last 72 hours. ABG    Component Value Date/Time   PHART 7.345 (L) 07/23/2017 2050   HCO3 17.5 (L) 07/23/2017 2050   TCO2 23 07/24/2017 1842   ACIDBASEDEF 7.0 (H) 07/23/2017 2050   O2SAT 91.0 07/23/2017 2050   CBG (last 3)  Recent Labs    07/29/17 1705 07/29/17 2145 07/30/17 0618  GLUCAP 139* 135* 113*    Assessment/Plan: S/P Procedure(s) (LRB): CORONARY ARTERY BYPASS GRAFTING (CABG) x Five , using  left internal mammary artery and right leg greater saphenous vein harvested endoscopically (N/A) TRANSESOPHAGEAL ECHOCARDIOGRAM (TEE) (N/A)  1. CV- PAF continues- currently NSR this morning- continue Amiodarone, Lopressor, Cozaar 2. Pulm- no acute issues, continue IS 3. Renal- creatinine WNL. Weight remains stable 4. Rash- improving, continue Kenalog 5. DM- sugars controlled 6. Dispo- patient with PAF, currently in NSR, may need to do short term anticoagulation, will discuss discharge plans with Dr. Tyrone Sage   LOS: 9 days    [name] 07/30/2017  Patient feels feels well, currently is in normal sinus rhythm however he continues to have bursts of atrial fibrillation times of atrial fibrillation of short duration.  We will ask cardiology see in regards to short-term anticoagulation for intermittent atrial fib prior to discharge.  I have seen and examined Joban Minkin and agree with the above assessment  and plan.  [name] MD Beeper [phone] Office [phone] 07/30/2017 11:31 AM

## 2017-07-30 NOTE — Progress Notes (Signed)
Checked on pt, he has walked x2 this am. In NSR. Sts he does not have in questions regarding education.  Ethelda Chick CES, ACSM 8:48 AM 07/30/2017

## 2017-07-30 NOTE — Progress Notes (Addendum)
Progress Note  Patient Name: [name] Date of Encounter: 07/30/2017  Primary Cardiologist: No primary care provider on file.   Subjective   Feeling better. No CP or dyspnea. No palpitations.   Inpatient Medications    Scheduled Meds: . amiodarone  400 mg Oral Q12H   Followed by  . [START ON 08/05/2017] amiodarone  400 mg Oral Daily  . aspirin EC  81 mg Oral Daily  . atorvastatin  80 mg Oral q1800  . clopidogrel  75 mg Oral Daily  . docusate sodium  200 mg Oral Daily  . enoxaparin (LOVENOX) injection  30 mg Subcutaneous Q24H  . insulin aspart  0-24 Units Subcutaneous TID AC & HS  . insulin detemir  10 Units Subcutaneous Daily  . losartan  100 mg Oral Daily  . mouth rinse  15 mL Mouth Rinse BID  . metFORMIN  1,000 mg Oral BID WC  . metoprolol tartrate  50 mg Oral BID  . pantoprazole  40 mg Oral QAC breakfast  . potassium chloride  40 mEq Oral Daily  . sodium chloride flush  3 mL Intravenous Q12H  . triamcinolone   Topical QID   Continuous Infusions: . sodium chloride Stopped (07/25/17 1120)   PRN Meds: sodium chloride, acetaminophen, bisacodyl **OR** bisacodyl, guaiFENesin, hydrocortisone cream, ondansetron **OR** ondansetron (ZOFRAN) IV, oxyCODONE, sodium chloride flush, traMADol   Vital Signs    Vitals:   07/30/17 0022 07/30/17 0525 07/30/17 0700 07/30/17 0832  BP: 92/65 115/77  137/78  Pulse: 67 67 69 75  Resp: 19 18 (!) 21 (!) 29  Temp: 98.4 F (36.9 C) (!) 97.5 F (36.4 C)  98.5 F (36.9 C)  TempSrc: Oral Oral  Oral  SpO2: 98% 100% (!) 88% 97%  Weight:  186 lb 4.8 oz (84.5 kg)    Height:        Intake/Output Summary (Last 24 hours) at 07/30/2017 1209 Last data filed at 07/30/2017 0500 Gross per 24 hour  Intake 672 ml  Output -  Net 672 ml   Filed Weights   07/28/17 0502 07/29/17 0605 07/30/17 0525  Weight: 191 lb (86.6 kg) 188 lb 6.4 oz (85.5 kg) 186 lb 4.8 oz (84.5 kg)    Telemetry    Sinus rhythm with episodes of AF with RVR, last  yesterday - Personally Reviewed   Physical Exam  Pleasant male, sitting up in chair GEN: No acute distress.   Neck: No JVD Cardiac: RRR, no murmurs, rubs, or gallops.  Respiratory: Clear to auscultation bilaterally. GI: Soft, nontender, non-distended  MS: No edema; No deformity. Neuro:  Nonfocal  Psych: Normal affect   Labs    Chemistry Recent Labs  Lab 07/26/17 0258 07/29/17 0254 07/30/17 0314  NA 132* 136 136  K 3.8 4.0 4.8  CL 97* 101 103  CO2 GLUCOSE 121* 117* 117*  BUN CREATININE 1.12 0.94 1.11  CALCIUM 7.9* 8.6* 8.9  GFRNONAA >60 >60 >60  GFRAA >60 >60 >60  ANIONGAP Hematology Recent Labs  Lab 07/24/17 1811  07/24/17 2153 07/25/17 0330 07/26/17 0258  WBC 10.2  --   --  9.1 9.3  RBC 3.39*  --   --  3.12* 2.98*  HGB 10.6*   < > 10.1* 9.8* 9.2*  HCT 32.7*   < > 30.8* 29.9* 28.1*  MCV 96.5  --   --  95.8 94.3  MCH 31.3  --   --  31.4 30.9  MCHC 32.4  --   --  32.8 32.7  RDW 13.4  --   --  13.4 13.1  PLT 117*  --   --  99* 131*   < > = values in this interval not displayed.    Cardiac EnzymesNo results for input(s): TROPONINI in the last 168 hours. No results for input(s): TROPIPOC in the last 168 hours.   BNPNo results for input(s): BNP, PROBNP in the last 168 hours.   DDimer No results for input(s): DDIMER in the last 168 hours.   Radiology    No results found.   Patient Profile     57 y.o. male with type 2 diabetes, non-STEMI at presentation, found to have severe multivessel coronary disease and underwent CABG 07/24/2017  Assessment & Plan    1.  Non-STEMI: Patient now stable and progressing well after CABG.  Medical program reviewed and he is tolerating aspirin, clopidogrel, and ARB, and a beta-blocker.  2.  Postoperative atrial fibrillation: Telemetry reviewed and demonstrates brief runs of rapid atrial fibrillation yesterday.  Considering his diabetes and CAD, it is probably reasonable to anticoagulate him  for a period of 6 to 8 weeks for postoperative atrial fibrillation.  We will continue amiodarone at the current dose.  Would discontinue clopidogrel and start him apixaban. Will consult case manager for medication needs to make sure his co-pay isn't prohibitive.   3. Hyperlipidemia: now on a high-intensity statin with atorvastatin 80 mg  4. Type II DM: on metformin  Dispo: anticipate home next 24 hours. Will arrange cardiology FU.   For questions or updates, please contact [facility] Please consult [email or web address] for contact info under Cardiology/STEMI.      Signed, Tonny Bollman, MD  07/30/2017, 12:09 PM

## 2017-08-03 ENCOUNTER — Telehealth: Payer: Self-pay | Admitting: *Deleted

## 2017-08-03 NOTE — Telephone Encounter (Signed)
Mr. [name] had CABG X 5 on 07/23/17 and was discharged on 07/30/17.  His daughter has called for him to relate some very mild dizziness, only when up. He also has "a metal taste in his mouth." His blood pressure has been running 112/74, 119/86. I suggested he get up slowly before walking to see if that will help with the dizziness  I explained that medications can cause the metallic taste. He is on Metformin and this is one of the medications known for this.  She understood and will relay this to her dad.

## 2017-08-06 ENCOUNTER — Emergency Department (HOSPITAL_COMMUNITY)
Admission: EM | Admit: 2017-08-06 | Discharge: 2017-08-07 | Disposition: A | Discharge status: Home / Self Care | Payer: BLUE CROSS/BLUE SHIELD | Attending: Emergency Medicine | Admitting: Emergency Medicine

## 2017-08-06 ENCOUNTER — Encounter (HOSPITAL_COMMUNITY): Payer: Self-pay

## 2017-08-06 ENCOUNTER — Emergency Department (HOSPITAL_COMMUNITY): Payer: BLUE CROSS/BLUE SHIELD

## 2017-08-06 DIAGNOSIS — Z87891 Personal history of nicotine dependence: Secondary | ICD-10-CM | POA: Insufficient documentation

## 2017-08-06 DIAGNOSIS — Z951 Presence of aortocoronary bypass graft: Secondary | ICD-10-CM | POA: Diagnosis not present

## 2017-08-06 DIAGNOSIS — Z7984 Long term (current) use of oral hypoglycemic drugs: Secondary | ICD-10-CM | POA: Diagnosis not present

## 2017-08-06 DIAGNOSIS — Z9889 Other specified postprocedural states: Secondary | ICD-10-CM | POA: Insufficient documentation

## 2017-08-06 DIAGNOSIS — I10 Essential (primary) hypertension: Secondary | ICD-10-CM | POA: Insufficient documentation

## 2017-08-06 DIAGNOSIS — E1100 Type 2 diabetes mellitus with hyperosmolarity without nonketotic hyperglycemic-hyperosmolar coma (NKHHC): Secondary | ICD-10-CM | POA: Insufficient documentation

## 2017-08-06 DIAGNOSIS — R112 Nausea with vomiting, unspecified: Secondary | ICD-10-CM | POA: Diagnosis not present

## 2017-08-06 DIAGNOSIS — Z79899 Other long term (current) drug therapy: Secondary | ICD-10-CM | POA: Insufficient documentation

## 2017-08-06 DIAGNOSIS — R438 Other disturbances of smell and taste: Secondary | ICD-10-CM | POA: Diagnosis not present

## 2017-08-06 DIAGNOSIS — R1111 Vomiting without nausea: Secondary | ICD-10-CM

## 2017-08-06 DIAGNOSIS — R432 Parageusia: Secondary | ICD-10-CM

## 2017-08-06 NOTE — ED Triage Notes (Signed)
Pt had recent open heart surgery on the 27th, today has been having chest tightness, nausea and vomiting x 1. Denies SOB, radiation to back, reports metallic taste in his mouth.

## 2017-08-07 ENCOUNTER — Telehealth (HOSPITAL_COMMUNITY): Payer: Self-pay

## 2017-08-07 LAB — CBC
HEMATOCRIT: 34.1 % — AB (ref 39.0–52.0)
HEMOGLOBIN: 11.5 g/dL — AB (ref 13.0–17.0)
MCH: 31.3 pg (ref 26.0–34.0)
MCHC: 33.7 g/dL (ref 30.0–36.0)
MCV: 92.7 fL (ref 78.0–100.0)
Platelets: 702 10*3/uL — ABNORMAL HIGH (ref 150–400)
RBC: 3.68 MIL/uL — AB (ref 4.22–5.81)
RDW: 13.2 % (ref 11.5–15.5)
WBC: 12.5 10*3/uL — AB (ref 4.0–10.5)

## 2017-08-07 LAB — BASIC METABOLIC PANEL
ANION GAP: 14 (ref 5–15)
BUN: 18 mg/dL (ref 6–20)
CHLORIDE: 91 mmol/L — AB (ref 101–111)
CO2: 26 mmol/L (ref 22–32)
Calcium: 9.8 mg/dL (ref 8.9–10.3)
Creatinine, Ser: 1.22 mg/dL (ref 0.61–1.24)
Glucose, Bld: 118 mg/dL — ABNORMAL HIGH (ref 65–99)
Potassium: 3.8 mmol/L (ref 3.5–5.1)
SODIUM: 131 mmol/L — AB (ref 135–145)

## 2017-08-07 LAB — I-STAT TROPONIN, ED
TROPONIN I, POC: 0.02 ng/mL (ref 0.00–0.08)
TROPONIN I, POC: 0 ng/mL (ref 0.00–0.08)

## 2017-08-07 MED ORDER — MAGIC MOUTHWASH
5.0000 mL | Freq: Once | ORAL | Status: AC
  Administered 2017-08-07: 5 mL via ORAL
  Filled 2017-08-07: qty 5

## 2017-08-07 MED ORDER — ONDANSETRON 4 MG PO TBDP
4.0000 mg | ORAL_TABLET | Freq: Once | ORAL | Status: AC
  Administered 2017-08-07: 4 mg via ORAL
  Filled 2017-08-07: qty 1

## 2017-08-07 NOTE — ED Notes (Signed)
Patient left at this time with all belongings. 

## 2017-08-07 NOTE — Discharge Instructions (Addendum)
You were seen today for metallic taste in her mouth and one episode of vomiting.  Your work-up is reassuring.  Her heart test is reassuring.  It is unclear which medication is the metallic taste.  Attempt to take medications spaced out to see if you could figure out which medication is causing the taste.  You may also call your cardiologist for further evaluation.

## 2017-08-07 NOTE — Telephone Encounter (Signed)
Patients insurance is active and benefits verified through [place] - No co-pay, deductible amount of $6,750/$6,750 has been met, out of pocket amount of $6,750/$6,750 has been met, no co-insurance, and no pre-authorization is required. Passport/reference [id number]  Will contact patient to see if he is interested in the Cardiac Rehab Program. If interested, patient will need to complete follow up appt. Once completed, patient will be contacted for scheduling upon review by the RN Navigator.

## 2017-08-07 NOTE — ED Provider Notes (Signed)
[facility] EMERGENCY DEPARTMENT Provider Note   CSN: [id number] Arrival date & time: 08/06/17  2254     History   Chief Complaint Chief Complaint  Patient presents with  . Chest Pain    HPI [name] is a 57 y.o. male.  HPI  This is a 57 year old male with a history of diabetes, hypertension, hyperlipidemia, coronary artery disease status post CABG x5 on April 22 of this year who presents with one episode of nausea and vomiting as well as persistent metallic taste in his mouth.  Patient reports that since discharge from hospital, he has noted a metallic taste in his mouth after taking his medications.  He takes all of his medications at once so he is unsure which medication is causing this symptom.  Today after taking his medications he became nauseated and vomited once.  He denies any chest pain, shortness of breath, abdominal pain.  He states he has been doing well after his CABG and has had no further chest pains.  Triage note indicates chest tightness; however, patient denies this to me.  Patient feels that he constantly has to eat things to keep the metallic taste out of his mouth.  Spanish interpreter used for history taking.  Past Medical History:  Diagnosis Date  . Diabetes mellitus without complication (HCC)   . Hyperlipidemia   . Hypertension     Patient Active Problem List   Diagnosis Date Noted  . Contact dermatitis 07/27/2017  . 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 NSTEMI (non-ST elevated myocardial infarction) (HCC)   . Essential hypertension   . Hyperlipidemia LDL goal <70   . Type 2 diabetes mellitus with hyperosmolarity without coma, without long-term current use of insulin [facility])     Past Surgical History:  Procedure Laterality Date  . CORONARY ARTERY BYPASS GRAFT N/A 07/23/2017   Procedure: CORONARY ARTERY BYPASS GRAFTING (CABG) x Five , using left internal mammary artery and right leg greater saphenous vein harvested endoscopically;   Surgeon: [name], MD;  Location: [facility] OR;  Service: Open Heart Surgery;  Laterality: N/A;  . LEFT HEART CATH AND CORONARY ANGIOGRAPHY N/A 07/22/2017   Procedure: LEFT HEART CATH AND CORONARY ANGIOGRAPHY;  Surgeon: Swaziland, Peter M, MD;  Location: [facility] INVASIVE CV LAB;  Service: Cardiovascular;  Laterality: N/A;  . TEE WITHOUT CARDIOVERSION N/A 07/23/2017   Procedure: TRANSESOPHAGEAL ECHOCARDIOGRAM (TEE);  Surgeon: [name], MD;  Location: [facility] OR;  Service: Open Heart Surgery;  Laterality: N/A;        Home Medications    Prior to Admission medications   Medication Sig Start Date End Date Taking? Authorizing Provider  amiodarone (PACERONE) 400 MG tablet Take 1 tablet (400 mg total) by mouth every 12 (twelve) hours. X 7 days, then decrease to 400 mg (1 tablet) daily Patient taking differently: Take 400 mg by mouth daily.  07/30/17  Yes Barrett, Erin R, PA-C  apixaban (ELIQUIS) 5 MG TABS tablet Take 1 tablet (5 mg total) by mouth 2 (two) times daily. 07/30/17  Yes Barrett, Rae Roam, PA-C  aspirin EC 81 MG EC tablet Take 1 tablet (81 mg total) by mouth daily. 07/31/17  Yes Barrett, Erin R, PA-C  atorvastatin (LIPITOR) 80 MG tablet Take 1 tablet (80 mg total) by mouth daily at 6 PM. 07/30/17  Yes Barrett, Erin R, PA-C  losartan-hydrochlorothiazide (HYZAAR) 100-25 MG tablet Take 1 tablet by mouth daily. 07/06/17  Yes [provider]  metFORMIN (GLUCOPHAGE) 1000 MG tablet  Take 1,000 mg by mouth 2 (two) times daily. 07/06/17  Yes [provider]  metoprolol tartrate (LOPRESSOR) 50 MG tablet Take 1 tablet (50 mg total) by mouth 2 (two) times daily. 07/30/17  Yes Barrett, Erin R, PA-C  acetaminophen (TYLENOL) 500 MG tablet Take 1 tablet (500 mg total) by mouth every 6 (six) hours as needed. Patient not taking: Reported on 07/21/2017 11/01/14   Piepenbrink, Victorino Dike, PA-C  lidocaine (XYLOCAINE) 2 % solution Use as directed 20 mLs in the mouth or throat as needed for mouth pain. Patient  not taking: Reported on 07/21/2017 11/01/14   Piepenbrink, Victorino Dike, PA-C  traMADol (ULTRAM) 50 MG tablet Take 1 tablet (50 mg total) by mouth every 4 (four) hours as needed for moderate pain. Patient not taking: Reported on 08/07/2017 07/30/17   Barrett, Denny Peon R, PA-C  triamcinolone (KENALOG) 0.025 % cream Apply topically 4 (four) times daily for 7 days. To rash along back Patient not taking: Reported on 08/07/2017 07/30/17 08/07/24  Barrett, Rae Roam, PA-C    Family History Family History  Problem Relation Age of Onset  . Heart disease Mother     Social History Social History   Tobacco Use  . Smoking status: Former Smoker    Last attempt to quit: 07/22/1998    Years since quitting: 19.0  . Smokeless tobacco: Never Used  Substance Use Topics  . Alcohol use: Yes  . Drug use: No     Allergies   Tape   Review of Systems Review of Systems  Constitutional: Negative for fever.  HENT:       Metallic taste mouth  Respiratory: Negative for shortness of breath.   Cardiovascular: Negative for chest pain and leg swelling.  Gastrointestinal: Positive for nausea and vomiting. Negative for abdominal pain and diarrhea.  Genitourinary: Negative for dysuria.  All other systems reviewed and are negative.    Physical Exam Updated Vital Signs BP 129/72   Pulse 80   Temp 98.3 F (36.8 C) (Oral)   Resp (!) 22   SpO2 100%   Physical Exam  Constitutional: He is oriented to person, place, and time. He appears well-developed and well-nourished. No distress.  HENT:  Head: Normocephalic and atraumatic.  Oropharynx moist and clear  Eyes: Pupils are equal, round, and reactive to light.  Neck: Neck supple.  Cardiovascular: Normal rate, regular rhythm and normal heart sounds.  No murmur heard. Midline sternotomy scar well healing  Pulmonary/Chest: Effort normal and breath sounds normal. No respiratory distress. He has no wheezes.  Abdominal: Soft. Bowel sounds are normal. There is no tenderness.  There is no rebound.  Musculoskeletal:       Right lower leg: He exhibits no edema.       Left lower leg: He exhibits no edema.  Lymphadenopathy:    He has no cervical adenopathy.  Neurological: He is alert and oriented to person, place, and time.  Skin: Skin is warm and dry.  Psychiatric: He has a normal mood and affect.  Nursing note and vitals reviewed.    ED Treatments / Results  Labs (all labs ordered are listed, but only abnormal results are displayed) Labs Reviewed  BASIC METABOLIC PANEL - Abnormal; Notable for the following components:      Result Value   Sodium 131 (*)    Chloride 91 (*)    Glucose, Bld 118 (*)    All other components within normal limits  CBC - Abnormal; Notable for the following components:   WBC  12.5 (*)    RBC 3.68 (*)    Hemoglobin 11.5 (*)    HCT 34.1 (*)    Platelets 702 (*)    All other components within normal limits  I-STAT TROPONIN, ED  I-STAT TROPONIN, ED    EKG EKG Interpretation  Date/Time:  Monday Aug 06 2017 23:00:40 EDT Ventricular Rate:  79 PR Interval:  166 QRS Duration: 104 QT Interval:  438 QTC Calculation: 502 R Axis:   61 Text Interpretation:  Sinus rhythm with occasional Premature ventricular complexes Inferior infarct , age undetermined Cannot rule out Anterior infarct , age undetermined Prolonged QT Abnormal ECG Confirmed by Ross Marcus ([id number]) on 08/07/2017 4:23:07 AM   Radiology Dg Chest 2 View  Result Date: 08/06/2017 CLINICAL DATA:  Recent heart surgery, difficulty swallowing food. EXAM: CHEST - 2 VIEW COMPARISON:  07/26/2017. FINDINGS: Previous median sternotomy for CABG. Cardiac and mediastinal silhouette is stable. Improved retrocardiac density, with some residual platelike atelectasis. 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 No consolidation or edema. Overall improved lung volumes. No esophageal air-fluid level is observed. IMPRESSION: Improved aeration. No esophageal air-fluid  level is observed. Status post CABG. Electronically Signed   By: Elsie Stain M.D.   On: 08/06/2017 23:31    Procedures Procedures (including critical care time)  Medications Ordered in ED Medications  ondansetron (ZOFRAN-ODT) disintegrating tablet 4 mg (4 mg Oral Given 08/07/17 0559)  magic mouthwash (5 mLs Oral Given 08/07/17 0645)     Initial Impression / Assessment and Plan / ED Course  I have reviewed the triage vital signs and the nursing notes.  Pertinent labs & imaging results that were available during my care of the patient were reviewed by me and considered in my medical decision making (see chart for details).     She presents with one episode of vomiting.  Also reports metallic taste in his mouth.  This is been ongoing and he relates this to taking his medications.  He takes all of his medications at once and is not sure which medication is causing this.  He is overall nontoxic-appearing.  He denies any chest complaints to me.  His surgical incision is well healing and his EKG is nonischemic.  Basic lab work-up is largely reassuring.  His exam is fairly benign.  Repeat troponin is negative.  Doubt cardiac etiology.  Patient felt much better after some swish and spit with mouthwash.  Recommend spacing out medications to identify which one is causing the metallic taste.  Discharge instructions were reviewed with the patient using an interpreter.  After history, exam, and medical workup I feel the patient has been appropriately medically screened and is safe for discharge home. Pertinent diagnoses were discussed with the patient. Patient was given return precautions.   Final Clinical Impressions(s) / ED Diagnoses   Final diagnoses:  Abnormal taste in mouth  Non-intractable vomiting without nausea, unspecified vomiting type    ED Discharge Orders    None       Shon Baton, MD 08/07/17 [phone]

## 2017-08-07 NOTE — Telephone Encounter (Signed)
Attempted to call patient through Interpreter services to see if he is interested in the Cardiac Rehab Program - Lm on vm

## 2017-08-08 DIAGNOSIS — I4891 Unspecified atrial fibrillation: Secondary | ICD-10-CM | POA: Insufficient documentation

## 2017-08-08 DIAGNOSIS — I9789 Other postprocedural complications and disorders of the circulatory system, not elsewhere classified: Secondary | ICD-10-CM

## 2017-08-08 NOTE — Progress Notes (Signed)
Cardiology Office Note    Date:  08/09/2017   ID:  [name], DOB Aug 26, 1960, MRN [id number]  PCP:  Sandre Kitty, PA-C  Cardiologist: Armanda Magic, MD  Chief Complaint  Patient presents with  . Hospitalization Follow-up    History of Present Illness:  [name] is a 57 y.o. male who was admitted with NSTEMI found to have severe three-vessel coronary artery disease and underwent CABG x5 with LIMA to the LAD, SVG to diagonal, SVG to OM1 and OM 2, SVG to RCA 07/24/2017.  He was sent home on aspirin,  beta-blocker and ARB.  He also had postop atrial fibrillation and was placed on amiodarone and apixaban for 6 to 8 weeks.  Patient also has hyperlipidemia and diabetes.  Went to the emergency room 08/07/17 with metallic taste in his mouth nausea and vomiting x1.  Patient comes in today accompanied by his wife and daughter who is the interpreter.  Overall he is doing better.  He is sore in his chest.  He denies dyspnea, edema, dizziness or presyncope.  They missed a call from cardiac rehab because of translation difficulty.  No bleeding or blood in his stools.    Past Medical History:  Diagnosis Date  . Diabetes mellitus without complication (HCC)   . Hyperlipidemia   . Hypertension     Past Surgical History:  Procedure Laterality Date  . CORONARY ARTERY BYPASS GRAFT N/A 07/23/2017   Procedure: CORONARY ARTERY BYPASS GRAFTING (CABG) x Five , using left internal mammary artery and right leg greater saphenous vein harvested endoscopically;  Surgeon: [name], MD;  Location: [facility] OR;  Service: Open Heart Surgery;  Laterality: N/A;  . LEFT HEART CATH AND CORONARY ANGIOGRAPHY N/A 07/22/2017   Procedure: LEFT HEART CATH AND CORONARY ANGIOGRAPHY;  Surgeon: Swaziland, Peter M, MD;  Location: [facility] INVASIVE CV LAB;  Service: Cardiovascular;  Laterality: N/A;  . TEE WITHOUT CARDIOVERSION N/A 07/23/2017   Procedure: TRANSESOPHAGEAL ECHOCARDIOGRAM (TEE);  Surgeon: [name], MD;   Location: [facility] OR;  Service: Open Heart Surgery;  Laterality: N/A;    Current Medications: Current Meds  Medication Sig  . acetaminophen (TYLENOL) 500 MG tablet Take 1 tablet (500 mg total) by mouth every 6 (six) hours as needed.  Marland Kitchen amiodarone (PACERONE) 400 MG tablet Take 1 tablet (400 mg total) by mouth every 12 (twelve) hours. X 7 days, then decrease to 400 mg (1 tablet) daily (Patient taking differently: Take 400 mg by mouth daily. )  . apixaban (ELIQUIS) 5 MG TABS tablet Take 1 tablet (5 mg total) by mouth 2 (two) times daily.  Marland Kitchen aspirin EC 81 MG EC tablet Take 1 tablet (81 mg total) by mouth daily.  Marland Kitchen atorvastatin (LIPITOR) 80 MG tablet Take 1 tablet (80 mg total) by mouth daily at 6 PM.  . lidocaine (XYLOCAINE) 2 % solution Use as directed 20 mLs in the mouth or throat as needed for mouth pain.  Marland Kitchen losartan-hydrochlorothiazide (HYZAAR) 100-25 MG tablet Take 1 tablet by mouth daily.  . metFORMIN (GLUCOPHAGE) 1000 MG tablet Take 1,000 mg by mouth 2 (two) times daily.  . metoprolol tartrate (LOPRESSOR) 50 MG tablet Take 1 tablet (50 mg total) by mouth 2 (two) times daily.  Marland Kitchen triamcinolone (KENALOG) 0.025 % cream Apply topically 4 (four) times daily for 7 days. To rash along back     Allergies:   Tape   Social History   Socioeconomic History  . Marital status: Married    Spouse  name: Not on file  . Number of children: Not on file  . Years of education: Not on file  . Highest education level: Not on file  Occupational History  . Not on file  Social Needs  . Financial resource strain: Not on file  . Food insecurity:    Worry: Not on file    Inability: Not on file  . Transportation needs:    Medical: Not on file    Non-medical: Not on file  Tobacco Use  . Smoking status: Former Smoker    Last attempt to quit: 07/22/1998    Years since quitting: 19.0  . Smokeless tobacco: Never Used  Substance and Sexual Activity  . Alcohol use: Yes  . Drug use: No  . Sexual activity: Not on  file  Lifestyle  . Physical activity:    Days per week: Not on file    Minutes per session: Not on file  . Stress: Not on file  Relationships  . Social connections:    Talks on phone: Not on file    Gets together: Not on file    Attends religious service: Not on file    Active member of club or organization: Not on file    Attends meetings of clubs or organizations: Not on file    Relationship status: Not on file  Other Topics Concern  . Not on file  Social History Narrative  . Not on file     Family History:  The patient's family history includes Heart disease in his mother.   ROS:   Please see the history of present illness.    Review of Systems  Constitution: Positive for malaise/fatigue.  HENT: Negative.        Metallic taste in his mouth  Cardiovascular: Negative.        Chest soreness at incision  Respiratory: Negative.   Endocrine: Negative.   Hematologic/Lymphatic: Negative.   Musculoskeletal: Negative.   Gastrointestinal: Negative.   Genitourinary: Negative.   Neurological: Positive for weakness.   All other systems reviewed and are negative.   PHYSICAL EXAM:   VS:  BP 110/66   Pulse 79   Ht  (1.676 m)   Wt 173 lb (78.5 kg)   BMI 27.92 kg/m   Physical Exam  GEN: Well nourished, well developed, in no acute distress  Neck: no JVD, carotid bruits, or masses Cardiac: Incision healing well RRR; no murmurs, rubs, or gallops  Respiratory:  clear to auscultation bilaterally, normal work of breathing GI: soft, nontender, nondistended, + BS Ext: without cyanosis, clubbing, or edema, Good distal pulses bilaterally Neuro:  Alert and Oriented x 3 Psych: euthymic mood, full affect  Wt Readings from Last 3 Encounters:  08/09/17 173 lb (78.5 kg)  07/30/17 186 lb 4.8 oz (84.5 kg)      Studies/Labs Reviewed:   EKG:  EKG is ordered today.  The ekg ordered today demonstrates normal sinus rhythm T wave inversion anterior lateral, no acute change Recent  Labs: 07/21/2017: ALT 33 07/29/2017: Magnesium 2.1; TSH 5.572 08/06/2017: BUN 18; Creatinine, Ser 1.22; Hemoglobin 11.5; Platelets 702; Potassium 3.8; Sodium 131   Lipid Panel    Component Value Date/Time   CHOL 103 07/22/2017 0303   TRIG 60 07/22/2017 0303   HDL 30 (L) 07/22/2017 0303   CHOLHDL 3.4 07/22/2017 0303   VLDL 12 07/22/2017 0303   LDLCALC 61 07/22/2017 0303    Additional studies/ records that were reviewed today include:   Significant Diagnostic Studies:  angiography:     Prox RCA lesion is 60% stenosed.  Mid RCA lesion is 99% stenosed.  Ost LM lesion is 50% stenosed.  Ost LAD to Prox LAD lesion is 50% stenosed.  Prox LAD lesion is 85% stenosed.  Ost 1st Diag to 1st Diag lesion is 90% stenosed.  Ost 1st Mrg to 1st Mrg lesion is 70% stenosed.  Ost 2nd Mrg to 2nd Mrg lesion is 50% stenosed.  The left ventricular systolic function is normal.  LV end diastolic pressure is normal.  The left ventricular ejection fraction is 50-55% by visual estimate.   1. Severe 3 vessel obstructive CAD. There is some ostial left main disease that is not severe but there is dampening of pressure with catheter engagement.    - complex bifurcation LAD/large diagonal stenosis.     - moderate OM disease    - severe mid RCA stenosis.  2. Low normal EF with inferior Hypokinesis 3. Normal LVEDP   Treatments: surgery:    Coronary artery bypass grafting x5 with the left internal mammary to the left anterior descending coronary artery, reverse saphenous vein graft to the diagonal coronary artery, sequential reverse saphenous vein graft to the first and second obtuse marginal, reverse saphenous vein graft to the distal right coronary artery with right thigh and calf greater saphenous endoscopic vein harvesting.      ASSESSMENT:    1. S/P CABG x 5   2. NSTEMI (non-ST elevated myocardial infarction) (HCC)   3. Essential hypertension   4. Hyperlipidemia LDL goal <70   5. Type 2  diabetes mellitus with hyperosmolarity without coma, without long-term current use of insulin (HCC)   6. Paroxysmal atrial fibrillation (HCC)      PLAN:  In order of problems listed above:   Status post CABG x5 07/27/2017 after NSTEMI patient is on aspirin and Eliquis because of postop atrial fibrillation.  Plavix was stopped in the hospital.  Patient went to the ER 08/07/2017 complaining of metallic taste in his mouth felt secondary to meds.  He is now doing better.  Recommend cardiac rehab.  Postop atrial fibrillation on amiodarone and Eliquis hopefully can stop in 6 to 8 weeks-follow-up with Dr. Mayford Knife in 1 month to reevaluate.  Essential hypertension blood pressure well controlled on losartan.  Labs checked when he went to the ER were all stable.  Hyperlipidemia on Lipitor.  Was on this prior to admission according to his daughter.  Diabetes mellitus on metformin managed by primary care.   Medication Adjustments/Labs and Tests Ordered: Current medicines are reviewed at length with the patient today.  Concerns regarding medicines are outlined above.  Medication changes, Labs and Tests ordered today are listed in the Patient Instructions below. There are no Patient Instructions on file for this visit.   Elson Clan, PA-C  08/09/2017 10:28 AM    [facility] [address] [place], [place], [place]  [address] Phone: [phone]; Fax: [phone]

## 2017-08-09 ENCOUNTER — Encounter: Payer: Self-pay | Admitting: Physician Assistant

## 2017-08-09 ENCOUNTER — Ambulatory Visit (INDEPENDENT_AMBULATORY_CARE_PROVIDER_SITE_OTHER): Payer: BLUE CROSS/BLUE SHIELD | Admitting: Physician Assistant

## 2017-08-09 VITALS — BP 110/66 | HR 79 | Ht 66.0 in | Wt 173.0 lb

## 2017-08-09 DIAGNOSIS — I480 Paroxysmal atrial fibrillation: Secondary | ICD-10-CM | POA: Diagnosis not present

## 2017-08-09 DIAGNOSIS — I214 Non-ST elevation (NSTEMI) myocardial infarction: Secondary | ICD-10-CM

## 2017-08-09 DIAGNOSIS — E785 Hyperlipidemia, unspecified: Secondary | ICD-10-CM | POA: Diagnosis not present

## 2017-08-09 DIAGNOSIS — E1100 Type 2 diabetes mellitus with hyperosmolarity without nonketotic hyperglycemic-hyperosmolar coma (NKHHC): Secondary | ICD-10-CM

## 2017-08-09 DIAGNOSIS — I10 Essential (primary) hypertension: Secondary | ICD-10-CM

## 2017-08-09 DIAGNOSIS — Z951 Presence of aortocoronary bypass graft: Secondary | ICD-10-CM | POA: Diagnosis not present

## 2017-08-09 NOTE — Patient Instructions (Addendum)
Medication Instructions:  Your physician recommends that you continue on your current medications as directed. Please refer to the Current Medication list given to you today.  If you need a refill on your cardiac medications, please contact your pharmacy first.  Labwork: None ordered   Testing/Procedures: None ordered   Follow-Up: Your physician wants you to follow-up with Dr. Mayford Knife on September 20, 2017 at [facility]   Any Other Special Instructions Will Be Listed Below (If Applicable). Your physician would like you to follow up with cardiac rehab at [phone]  Thank you for choosing [facility]    Lyda Perone, RN  [phone]  If you need a refill on your cardiac medications before your next appointment, please call your pharmacy.

## 2017-08-13 ENCOUNTER — Telehealth (HOSPITAL_COMMUNITY): Payer: Self-pay

## 2017-08-13 NOTE — Telephone Encounter (Signed)
Daughter of patient called to get patient scheduled for Cardiac Rehab - Scheduled orientation on 10/11/17 at 8;30am. Patient will attend the 8:15am exc class. Mailed packet.

## 2017-08-15 ENCOUNTER — Telehealth: Payer: Self-pay

## 2017-08-15 DIAGNOSIS — R110 Nausea: Secondary | ICD-10-CM

## 2017-08-15 MED ORDER — ONDANSETRON HCL 4 MG PO TABS: 4.0000 mg | ORAL_TABLET | Freq: Three times a day (TID) | ORAL | 0 refills | Status: DC | PRN

## 2017-08-15 NOTE — Telephone Encounter (Signed)
Patient's daughter, [name] calling for her dad who is C/O feeling nauseous every day. He has not been vomiting this week. He has no fevers and his vital signs are all normal. He is taking tylenol for pain. All on his incision sites look good and he is having normal BM's. Dr Tyrone Sage ok' ed Rx for Zofran 4 mg prn. RX was sent to pharm. Patient was instructed to call back if no improvement

## 2017-08-22 ENCOUNTER — Other Ambulatory Visit: Payer: Self-pay | Admitting: Cardiothoracic Surgery

## 2017-08-22 DIAGNOSIS — Z951 Presence of aortocoronary bypass graft: Secondary | ICD-10-CM

## 2017-08-28 ENCOUNTER — Encounter: Payer: Self-pay | Admitting: Physician Assistant

## 2017-08-28 ENCOUNTER — Ambulatory Visit
Admission: RE | Admit: 2017-08-28 | Discharge: 2017-08-28 | Disposition: A | Discharge status: Home / Self Care | Payer: BLUE CROSS/BLUE SHIELD | Source: Ambulatory Visit | Attending: Cardiothoracic Surgery | Admitting: Cardiothoracic Surgery

## 2017-08-28 ENCOUNTER — Other Ambulatory Visit: Payer: Self-pay | Admitting: Physician Assistant

## 2017-08-28 ENCOUNTER — Other Ambulatory Visit: Payer: Self-pay

## 2017-08-28 ENCOUNTER — Ambulatory Visit (INDEPENDENT_AMBULATORY_CARE_PROVIDER_SITE_OTHER): Payer: Self-pay | Admitting: Physician Assistant

## 2017-08-28 VITALS — BP 101/65 | HR 67 | Resp 18 | Ht 66.0 in | Wt 166.0 lb

## 2017-08-28 DIAGNOSIS — Z951 Presence of aortocoronary bypass graft: Secondary | ICD-10-CM

## 2017-08-28 DIAGNOSIS — R110 Nausea: Secondary | ICD-10-CM

## 2017-08-28 MED ORDER — ONDANSETRON HCL 4 MG PO TABS: 4.0000 mg | ORAL_TABLET | Freq: Three times a day (TID) | ORAL | 0 refills | Status: DC | PRN

## 2017-08-28 NOTE — Patient Instructions (Signed)
You are encouraged to enroll and participate in the outpatient cardiac rehab program beginning as soon as practical.  Make every effort to keep your diabetes under very tight control.  Follow up closely with your primary care physician or endocrinologist and strive to keep their hemoglobin A1c levels as low as possible, preferably near or below 6.0.  The long term benefits of strict control of diabetes are far reaching and critically important for your overall health and survival.  You may return to driving an automobile as long as you are no longer requiring oral narcotic pain relievers during the daytime.  It would be wise to start driving only short distances during the daylight and gradually increase from there as you feel comfortable.  Make every effort to stay physically active, get some type of exercise on a regular basis, and stick to a "heart healthy diet".  The long term benefits for regular exercise and a healthy diet are critically important to your overall health and wellbeing.   

## 2017-08-28 NOTE — Progress Notes (Signed)
[address]       [place] [address]             [phone]      [name] is a 57 y.o. male patient s/p CABG x 5 on 07/23/2017. His post-op course was complicated by developing a rash which was treated with Kenalog cream. He also had post-op atrial fibrillation and Eliquis was initiated.   1. S/P CABG x 5    Past Medical History:  Diagnosis Date  . Diabetes mellitus without complication (HCC)   . Hyperlipidemia   . Hypertension    No past surgical history pertinent negatives on file. Scheduled Meds: Current Outpatient Medications on File Prior to Visit  Medication Sig Dispense Refill  . acetaminophen (TYLENOL) 500 MG tablet Take 1 tablet (500 mg total) by mouth every 6 (six) hours as needed. 30 tablet 0  . amiodarone (PACERONE) 400 MG tablet Take 1 tablet (400 mg total) by mouth every 12 (twelve) hours. X 7 days, then decrease to 400 mg (1 tablet) daily (Patient taking differently: Take 400 mg by mouth daily. ) 60 tablet 1  . apixaban (ELIQUIS) 5 MG TABS tablet Take 1 tablet (5 mg total) by mouth 2 (two) times daily. 60 tablet 1  . aspirin EC 81 MG EC tablet Take 1 tablet (81 mg total) by mouth daily.    Marland Kitchen atorvastatin (LIPITOR) 80 MG tablet Take 1 tablet (80 mg total) by mouth daily at 6 PM. 30 tablet 3  . lidocaine (XYLOCAINE) 2 % solution Use as directed 20 mLs in the mouth or throat as needed for mouth pain. 100 mL 0  . losartan-hydrochlorothiazide (HYZAAR) 100-25 MG tablet Take 1 tablet by mouth daily.  5  . metFORMIN (GLUCOPHAGE) 1000 MG tablet Take 1,000 mg by mouth 2 (two) times daily.  2  . metoprolol tartrate (LOPRESSOR) 50 MG tablet Take 1 tablet (50 mg total) by mouth 2 (two) times daily. 60 tablet 3  . ondansetron (ZOFRAN) 4 MG tablet Take 1 tablet (4 mg total) by mouth every 8 (eight) hours as needed for nausea. Deliver to pt's home 15 tablet 0   No current facility-administered medications on file prior to visit.     Allergies    Allergen Reactions  . Tape    Blood pressure 101/65, pulse 67, resp. rate 18, height  (1.676 m), weight 166 lb (75.3 kg), SpO2 99 %.  Subjective : The patient states that he continues to have a metallic taste in his mouth and he is still vomiting every couple of days. He is able to keep down food and he is able to drink enough fluid between water and sparkling water. His wife has been helping him a lot still because she got the impression when he left the hospital that she should.   Objective :  Cor: RRR, no murmur Pulm: CTA bilaterally Abd: normal bowel sounds Ext: no edema Wound: c/d/i  CLINICAL DATA:  Hypertension and diabetes.  Incisional pain.  EXAM: CHEST - 2 VIEW  COMPARISON:  08/06/2017  FINDINGS: Previous median sternotomy and CABG. Improved aeration at the lung bases. Mild residual volume loss at the right base. No edema. No effusions. No unexpected bone finding.  IMPRESSION: Improved since last exam. Only mild residual volume loss at the right lung base.   Electronically Signed   By: Paulina Fusi M.D.   On: 08/28/2017 13:32  Assessment & Plan   There was an  interrupter there throughout the entire interview.  We discussed the metallic taste which is probably medication related. He is only on  of Amio and I will leave this up to cardiology to discontinue. It may be contributing to his nausea. I refilled his zofran prescription. The nausea does seem to be improving a little and he has not vomited in three days. He is up and walking around but needs to be more independent at this point. Spoke to the wife about letting him do more for himself. He needs to get up himself and use the bathroom himself. I reviewed his Bps and blood glucose levels from the last few weeks which have all been fine. It was a little low today but he has not had symptoms. He is in NSR today in the 60s. I encouraged a BRAT diet for the nausea and avoiding spicy foods. He has a  cardiology appointment next month and is set up for a cardiac rehab evaluation. He may drive since he is no longer requiring narcotic pain medication. I answered all their questions to their satisfaction. He is to follow-up with [place] PRN.   New prescriptions: Zofran  q 8 hours PRN for nausea.    Sharlene Dory 08/28/2017

## 2017-09-20 ENCOUNTER — Ambulatory Visit (INDEPENDENT_AMBULATORY_CARE_PROVIDER_SITE_OTHER): Payer: BLUE CROSS/BLUE SHIELD | Admitting: Cardiology

## 2017-09-20 ENCOUNTER — Encounter: Payer: Self-pay | Admitting: Cardiology

## 2017-09-20 VITALS — BP 108/64 | HR 70 | Ht 66.0 in | Wt 173.0 lb

## 2017-09-20 DIAGNOSIS — I2510 Atherosclerotic heart disease of native coronary artery without angina pectoris: Secondary | ICD-10-CM | POA: Diagnosis not present

## 2017-09-20 DIAGNOSIS — E785 Hyperlipidemia, unspecified: Secondary | ICD-10-CM

## 2017-09-20 DIAGNOSIS — I480 Paroxysmal atrial fibrillation: Secondary | ICD-10-CM | POA: Diagnosis not present

## 2017-09-20 DIAGNOSIS — I10 Essential (primary) hypertension: Secondary | ICD-10-CM

## 2017-09-20 HISTORY — DX: Atherosclerotic heart disease of native coronary artery without angina pectoris: I25.10

## 2017-09-20 MED ORDER — LOSARTAN POTASSIUM-HCTZ 100-25 MG PO TABS: 1.0000 | ORAL_TABLET | Freq: Every day | ORAL | 3 refills | Status: DC

## 2017-09-20 MED ORDER — METOPROLOL TARTRATE 50 MG PO TABS: 50.0000 mg | ORAL_TABLET | Freq: Two times a day (BID) | ORAL | 3 refills | Status: DC

## 2017-09-20 MED ORDER — ATORVASTATIN CALCIUM 80 MG PO TABS: 80.0000 mg | ORAL_TABLET | Freq: Every day | ORAL | 3 refills | Status: DC

## 2017-09-20 MED ORDER — AMIODARONE HCL 200 MG PO TABS: 200.0000 mg | ORAL_TABLET | Freq: Every day | ORAL | 3 refills | Status: DC

## 2017-09-20 MED ORDER — APIXABAN 5 MG PO TABS: 5.0000 mg | ORAL_TABLET | Freq: Two times a day (BID) | ORAL | 3 refills | Status: DC

## 2017-09-20 NOTE — Progress Notes (Signed)
Cardiology Office Note:    Date:  09/20/2017   ID:  [name], DOB [birth year], MRN [id number]  PCP:  Sandre Kitty, PA-C  Cardiologist:  Armanda Magic, MD    Referring MD: Sandre Kitty, PA-C   Chief Complaint  Patient presents with  . Coronary Artery Disease  . Atrial Fibrillation  . Hyperlipidemia    History of Present Illness:    [name] is a 57 y.o. male with a hx of NSTEMI found to have severe three-vessel coronary artery disease and underwent CABG x5 with LIMA to the LAD, SVG to diagonal, SVG to OM1 and OM 2, SVG to RCA 07/24/2017.  He also had postop atrial fibrillation and was placed on amiodarone and apixaban for 6 to 8 weeks.  Patient also has hyperlipidemia and diabetes. He is here today for followup and is doing well.  He denies any chest pain or pressure, SOB, DOE, PND, orthopnea, LE edema, dizziness, palpitations or syncope. He is compliant with his meds and is tolerating meds with no SE.     Past Medical History:  Diagnosis Date  . CAD (coronary artery disease), native coronary artery 09/20/2017  . Diabetes mellitus without complication (HCC)   . Hyperlipidemia   . Hypertension     Past Surgical History:  Procedure Laterality Date  . CORONARY ARTERY BYPASS GRAFT N/A 07/23/2017   Procedure: CORONARY ARTERY BYPASS GRAFTING (CABG) x Five , using left internal mammary artery and right leg greater saphenous vein harvested endoscopically;  Surgeon: [name], MD;  Location: [facility] OR;  Service: Open Heart Surgery;  Laterality: N/A;  . LEFT HEART CATH AND CORONARY ANGIOGRAPHY N/A 07/22/2017   Procedure: LEFT HEART CATH AND CORONARY ANGIOGRAPHY;  Surgeon: [place], Peter M, MD;  Location: [facility] INVASIVE CV LAB;  Service: Cardiovascular;  Laterality: N/A;  . TEE WITHOUT CARDIOVERSION N/A 07/23/2017   Procedure: TRANSESOPHAGEAL ECHOCARDIOGRAM (TEE);  Surgeon: [name], MD;  Location: [facility] OR;  Service: Open Heart Surgery;  Laterality: N/A;    Current  Medications: Current Meds  Medication Sig  . acetaminophen (TYLENOL) 500 MG tablet Take 1 tablet (500 mg total) by mouth every 6 (six) hours as needed.  Marland Kitchen amiodarone (PACERONE) 200 MG tablet Take 200 mg by mouth daily.  Marland Kitchen apixaban (ELIQUIS) 5 MG TABS tablet Take 1 tablet (5 mg total) by mouth 2 (two) times daily.  Marland Kitchen aspirin EC 81 MG EC tablet Take 1 tablet (81 mg total) by mouth daily.  Marland Kitchen atorvastatin (LIPITOR) 80 MG tablet Take 1 tablet (80 mg total) by mouth daily at 6 PM.  . lidocaine (XYLOCAINE) 2 % solution Use as directed 20 mLs in the mouth or throat as needed for mouth pain.  Marland Kitchen losartan-hydrochlorothiazide (HYZAAR) 100-25 MG tablet Take 1 tablet by mouth daily.  . metFORMIN (GLUCOPHAGE) 1000 MG tablet Take 1,000 mg by mouth 2 (two) times daily.  . metoprolol tartrate (LOPRESSOR) 50 MG tablet Take 1 tablet (50 mg total) by mouth 2 (two) times daily.  . ondansetron (ZOFRAN) 4 MG tablet Take 1 tablet (4 mg total) by mouth every 8 (eight) hours as needed for nausea or vomiting.     Allergies:   Tape   Social History   Socioeconomic History  . Marital status: Married    Spouse name: Not on file  . Number of children: Not on file  . Years of education: Not on file  . Highest education level: Not on file  Occupational History  . Not  on file  Social Needs  . Financial resource strain: Not on file  . Food insecurity:    Worry: Not on file    Inability: Not on file  . Transportation needs:    Medical: Not on file    Non-medical: Not on file  Tobacco Use  . Smoking status: Former Smoker    Last attempt to quit: 07/22/1998    Years since quitting: 19.1  . Smokeless tobacco: Never Used  Substance and Sexual Activity  . Alcohol use: Yes  . Drug use: No  . Sexual activity: Not on file  Lifestyle  . Physical activity:    Days per week: Not on file    Minutes per session: Not on file  . Stress: Not on file  Relationships  . Social connections:    Talks on phone: Not on file      Gets together: Not on file    Attends religious service: Not on file    Active member of club or organization: Not on file    Attends meetings of clubs or organizations: Not on file    Relationship status: Not on file  Other Topics Concern  . Not on file  Social History Narrative  . Not on file     Family History: The patient's family history includes Heart disease in his mother.  ROS:   Please see the history of present illness.    ROS  All other systems reviewed and negative.   EKGs/Labs/Other Studies Reviewed:    The following studies were reviewed today: none  EKG:  EKG is not ordered today.    Recent Labs: 07/21/2017: ALT 33 07/29/2017: Magnesium 2.1; TSH 5.572 08/06/2017: BUN 18; Creatinine, Ser 1.22; Hemoglobin 11.5; Platelets 702; Potassium 3.8; Sodium 131   Recent Lipid Panel    Component Value Date/Time   CHOL 103 07/22/2017 0303   TRIG 60 07/22/2017 0303   HDL 30 (L) 07/22/2017 0303   CHOLHDL 3.4 07/22/2017 0303   VLDL 12 07/22/2017 0303   LDLCALC 61 07/22/2017 0303    Physical Exam:    VS:  BP 108/64   Pulse 70   Ht 5\' 6"  (1.676 m)   Wt 173 lb (78.5 kg)   SpO2 98%   BMI 27.92 kg/m     Wt Readings from Last 3 Encounters:  09/20/17 173 lb (78.5 kg)  08/28/17 166 lb (75.3 kg)  08/09/17 173 lb (78.5 kg)     GEN:  Well nourished, well developed in no acute distress HEENT: Normal NECK: No JVD; No carotid bruits LYMPHATICS: No lymphadenopathy CARDIAC: RRR, no murmurs, rubs, gallops RESPIRATORY:  Clear to auscultation without rales, wheezing or rhonchi  ABDOMEN: Soft, non-tender, non-distended MUSCULOSKELETAL:  No edema; No deformity  SKIN: Warm and dry NEUROLOGIC:  Alert and oriented x 3 PSYCHIATRIC:  Normal affect   ASSESSMENT:    1. Coronary artery disease involving native coronary artery of native heart without angina pectoris   2. Paroxysmal atrial fibrillation (HCC)   3. Essential hypertension   4. Hyperlipidemia LDL goal <70     PLAN:    In order of problems listed above:  1.  ASCAD - s/p NSTEMI with cath showing severe three-vessel coronary artery disease and underwent CABG x5 with LIMA to the LAD, SVG to diagonal, SVG to OM1 and OM 2, SVG to RCA 07/24/2017.  He has not had any further anginal chest pain.  He will continue on aspirin 81 mg daily, Lopressor 50 mg twice daily  and statin.  2.  Postoperative atrial fibrillation -he is maintaining normal sinus rhythm on exam today. CHADS2VASC score is 2 but this was post op and he had no prior history of atrial fibrillation.  He is now 2 months out from heart surgery.  We will keep him on amiodarone and Eliquis 1 more month.  I will get an event monitor to make sure he is not having any silent A. fib.  If he is not we will stop his amiodarone and apixaban.  3.  Hypertension - BP is well controlled on exam today.  He will continue on Hyzaar 100-25 mg daily and Lopressor 50 mg twice daily  4.  Hyperlipidemia with LDL goal less than 70.  He will continue on atorvastatin 80 mg daily.  LDL was 61 on 07/22/2017.  Medication Adjustments/Labs and Tests Ordered: Current medicines are reviewed at length with the patient today.  Concerns regarding medicines are outlined above.  No orders of the defined types were placed in this encounter.  No orders of the defined types were placed in this encounter.   Signed, Armanda Magicraci Leonard Hendler, MD  09/20/2017 2:25 PM    [facility]

## 2017-09-20 NOTE — Patient Instructions (Signed)
Medication Instructions:  Your physician recommends that you continue on your current medications as directed. Please refer to the Current Medication list given to you today.   Labwork: None ordered  Testing/Procedures: Your physician has recommended that you wear an event monitor. Event monitors are medical devices that record the heart's electrical activity. Doctors most often us these monitors to diagnose arrhythmias. Arrhythmias are problems with the speed or rhythm of the heartbeat. The monitor is a small, portable device. You can wear one while you do your normal daily activities. This is usually used to diagnose what is causing palpitations/syncope (passing out).    Follow-Up: Your physician wants you to follow-up in:  10/18/17 ARRIVE AT 9:15 TO SEE Boyce MediciBRITTANY SIMMONS, PA-C  Your physician recommends that you schedule a follow-up appointment in: 6 MONTHS WITH DR. Mayford KnifeURNER   Any Other Special Instructions Will Be Listed Below (If Applicable).    Cardiac Event Monitoring A cardiac event monitor is a small recording device that is used to detect abnormal heart rhythms (arrhythmias). The monitor is used to record your heart rhythm when you have symptoms, such as:  Fast heartbeats (palpitations), such as heart racing or fluttering.  Dizziness.  Fainting or light-headedness.  Unexplained weakness.  Some monitors are wired to electrodes placed on your chest. Electrodes are flat, sticky disks that attach to your skin. Other monitors may be hand-held or worn on the wrist. The monitor can be worn for up to 30 days. If the monitor is attached to your chest, a technician will prepare your chest for the electrode placement and show you how to work the monitor. Take time to practice using the monitor before you leave the office. Make sure you understand how to send the information from the monitor to your health care provider. In some cases, you may need to use a landline telephone instead of a  cell phone. What are the risks? Generally, this device is safe to use, but it possible that the skin under the electrodes will become irritated. How to use your cardiac event monitor  Wear your monitor at all times, except when you are in water: ? Do not let the monitor get wet. ? Take the monitor off when you bathe. Do not swim or use a hot tub with it on.  Keep your skin clean. Do not put body lotion or moisturizer on your chest.  Change the electrodes as told by your health care provider or any time they stop sticking to your skin. You may need to use medical tape to keep them on.  Try to put the electrodes in slightly different places on your chest to help prevent skin irritation. They must remain in the area under your left breast and in the upper right section of your chest.  Make sure the monitor is safely clipped to your clothing or in a location close to your body that your health care provider recommends.  Press the button to record as soon as you feel heart-related symptoms, such as: ? Dizziness. ? Weakness. ? Light-headedness. ? Palpitations. ? Thumping or pounding in your chest. ? Shortness of breath. ? Unexplained weakness.  Keep a diary of your activities, such as walking, doing chores, and taking medicine. It is very important to note what you were doing when you pushed the button to record your symptoms. This will help your health care provider determine what might be contributing to your symptoms.  Send the recorded information as recommended by your health care provider.  It may take some time for your health care provider to process the results.  Change the batteries as told by your health care provider.  Keep electronic devices away from your monitor. This includes: ? Tablets. ? MP3 players. ? Cell phones.  While wearing your monitor you should avoid: ? Electric blankets. ? Firefighter. ? Electric toothbrushes. ? Microwave ovens. ? Magnets. ? Metal  detectors. Get help right away if:  You have chest pain.  You have extreme difficulty breathing or shortness of breath.  You develop a very fast heartbeat that persists.  You develop dizziness that does not go away.  You faint or constantly feel like you are about to faint. Summary  A cardiac event monitor is a small recording device that is used to help detect abnormal heart rhythms (arrhythmias).  The monitor is used to record your heart rhythm when you have heart-related symptoms.  Make sure you understand how to send the information from the monitor to your health care provider.  It is important to press the button on the monitor when you have any heart-related symptoms.  Keep a diary of your activities, such as walking, doing chores, and taking medicine. It is very important to note what you were doing when you pushed the button to record your symptoms. This will help your health care provider learn what might be causing your symptoms. This information is not intended to replace advice given to you by your health care provider. Make sure you discuss any questions you have with your health care provider. Document Released: 12/28/2007 Document Revised: 03/04/2016 Document Reviewed: 03/04/2016 Elsevier Interactive Patient Education  2017 [employer].   If you need a refill on your cardiac medications before your next appointment, please call your pharmacy.

## 2017-09-28 ENCOUNTER — Ambulatory Visit (INDEPENDENT_AMBULATORY_CARE_PROVIDER_SITE_OTHER): Payer: BLUE CROSS/BLUE SHIELD

## 2017-09-28 DIAGNOSIS — I480 Paroxysmal atrial fibrillation: Secondary | ICD-10-CM

## 2017-10-11 ENCOUNTER — Encounter (HOSPITAL_COMMUNITY): Payer: Self-pay

## 2017-10-11 ENCOUNTER — Encounter (HOSPITAL_COMMUNITY)
Admission: RE | Admit: 2017-10-11 | Discharge: 2017-10-11 | Disposition: A | Discharge status: Home / Self Care | Payer: BLUE CROSS/BLUE SHIELD | Source: Ambulatory Visit | Attending: Cardiology | Admitting: Cardiology

## 2017-10-11 VITALS — Ht 67.0 in | Wt 177.0 lb

## 2017-10-11 DIAGNOSIS — I10 Essential (primary) hypertension: Secondary | ICD-10-CM | POA: Diagnosis not present

## 2017-10-11 DIAGNOSIS — E119 Type 2 diabetes mellitus without complications: Secondary | ICD-10-CM | POA: Insufficient documentation

## 2017-10-11 DIAGNOSIS — Z951 Presence of aortocoronary bypass graft: Secondary | ICD-10-CM | POA: Diagnosis not present

## 2017-10-11 DIAGNOSIS — E785 Hyperlipidemia, unspecified: Secondary | ICD-10-CM | POA: Insufficient documentation

## 2017-10-11 DIAGNOSIS — I2510 Atherosclerotic heart disease of native coronary artery without angina pectoris: Secondary | ICD-10-CM | POA: Diagnosis not present

## 2017-10-11 DIAGNOSIS — I214 Non-ST elevation (NSTEMI) myocardial infarction: Secondary | ICD-10-CM | POA: Diagnosis present

## 2017-10-11 NOTE — Progress Notes (Signed)
Cardiac Rehab Medication Review by a Nurse  Does the patient  feel that his/her medications are working for him/her?  yes  Has the patient been experiencing any side effects to the medications prescribed?  no  Does the patient measure his/her own blood pressure or blood glucose at home?  yes   Does the patient have any problems obtaining medications due to transportation or finances?   no  Understanding of regimen: good Understanding of indications: good Potential of compliance: good    Nurse comments: Patient and his daughter are here for orientation. Mr [name] says he understands what is medications are for and is complaint with taking them.  Kenneth HeadingsMaria Walden [name]  RN BSN 10/11/2017 9:53 AM

## 2017-10-11 NOTE — Progress Notes (Signed)
[name] 57 y.o. male DOB: 06/20/1960 MRN: [id number]      Nutrition Note  1. S/P CABG x 5   2. NSTEMI (non-ST elevated myocardial infarction) [facility](HCC)    Past Medical History:  Diagnosis Date  . CAD (coronary artery disease), native coronary artery 09/20/2017  . Diabetes mellitus without complication (HCC)   . Hyperlipidemia   . Hypertension    Meds reviewed. Atorvastatin, metformin, lopressor noted  HT: Ht Readings from Last 1 Encounters:  10/11/17 5\' 7"  (1.702 m)    WT: Wt Readings from Last 5 Encounters:  10/11/17 177 lb 0.5 oz (80.3 kg)  09/20/17 173 lb (78.5 kg)  08/28/17 166 lb (75.3 kg)  08/09/17 173 lb (78.5 kg)  07/30/17 186 lb 4.8 oz (84.5 kg)     Body mass index is 27.73 kg/m.   Current tobacco use? No  Labs:  Lipid Panel     Component Value Date/Time   CHOL 103 07/22/2017 0303   TRIG 60 07/22/2017 0303   HDL 30 (L) 07/22/2017 0303   CHOLHDL 3.4 07/22/2017 0303   VLDL 12 07/22/2017 0303   LDLCALC 61 07/22/2017 0303    Lab Results  Component Value Date   HGBA1C 6.7 (H) 07/21/2017   CBG (last 3)  No results for input(s): GLUCAP in the last 72 hours.  Nutrition Note Spoke with pt via interpreter today, pt is spanish speaking. Nutrition plan and goals reviewed with pt. Pt is following Step 2 of the Therapeutic Lifestyle Changes diet. Pt wants to lose wt. Pt has been trying to lose wt by switching to complex carbohydrates from refined grains and simple carbohydrates, eating lean protein, and eating smaller portions sizes. Wt loss tips reviewed. Pt is diabetic. Last A1c indicates blood glucose well-controlled. Per discussion, pt does not use canned/convenience foods often. Pt rarely adds salt to food. Pt eats out infrequently. Pts wife cooks the majority of his meals at home. Pt expressed understanding of the information reviewed. Pt aware of nutrition education classes offered and does not plan on attending.  Nutrition Diagnosis Food-and nutrition-related  knowledge deficit related to lack of exposure to information as related to diagnosis of: ? CVD ? DM   Nutrition Intervention ? Pt's individual nutrition plan and goals reviewed with pt. ? Pt given handouts for: ? Nutrition I class ? Nutrition II class  ? Diabetes Blitz Class ? Diabetes Q & A class   Nutrition Goal(s):   ? Pt to identify and limit food sources of saturated fat, trans fat, and sodium ? Pt to identify food quantities necessary to achieve weight loss of 6-24 lbs. at graduation from cardiac rehab. Goal wt loss of 10 lb desired.  ? Pt to eat more and a variety of non-starchy vegetables. ? Pt to eat more beans and peas. ? Pt to choose nuts and nut butters more often.   Plan:  ? Pt to attend nutrition classes ? Nutrition I ? Nutrition II ? Portion Distortion  ? Diabetes Blitz ? Diabetes Q & Ae determined ? Will provide client-centered nutrition education as part of interdisciplinary care ? Monitor and evaluate progress toward nutrition goal with team.   Ross MarcusAubrey Burklin, MS, RD, LDN 10/11/2017 1:35 PM

## 2017-10-11 NOTE — Progress Notes (Signed)
Cardiac Individual Treatment Plan  Patient Details  Name: [name] MRN: [id number] Date of Birth: 04-29-1960 Referring Provider:   Flowsheet Row CARDIAC REHAB PHASE II ORIENTATION from 10/11/2017 in [facility] CARDIAC REHAB  Referring Provider  Armanda Magic MD      Initial Encounter Date:  Flowsheet Row CARDIAC REHAB PHASE II ORIENTATION from 10/11/2017 in [facility] CARDIAC REHAB  Date  10/11/17      Visit Diagnosis: S/P CABG x 5  NSTEMI (non-ST elevated myocardial infarction) (HCC)  Patient's Home Medications on Admission:  Current Outpatient Medications:  .  acetaminophen (TYLENOL) 500 MG tablet, Take 1 tablet (500 mg total) by mouth every 6 (six) hours as needed., Disp: 30 tablet, Rfl: 0 .  amiodarone (PACERONE) 200 MG tablet, Take 1 tablet (200 mg total) by mouth daily., Disp: 90 tablet, Rfl: 3 .  apixaban (ELIQUIS) 5 MG TABS tablet, Take 1 tablet (5 mg total) by mouth 2 (two) times daily., Disp: 180 tablet, Rfl: 3 .  aspirin EC 81 MG EC tablet, Take 1 tablet (81 mg total) by mouth daily., Disp: , Rfl:  .  atorvastatin (LIPITOR) 80 MG tablet, Take 1 tablet (80 mg total) by mouth daily at 6 PM., Disp: 90 tablet, Rfl: 3 .  losartan-hydrochlorothiazide (HYZAAR) 100-25 MG tablet, Take 1 tablet by mouth daily., Disp: 90 tablet, Rfl: 3 .  metFORMIN (GLUCOPHAGE) 1000 MG tablet, Take 1,000 mg by mouth 2 (two) times daily., Disp: , Rfl: 2 .  metoprolol tartrate (LOPRESSOR) 50 MG tablet, Take 1 tablet (50 mg total) by mouth 2 (two) times daily., Disp: 180 tablet, Rfl: 3 .  lidocaine (XYLOCAINE) 2 % solution, Use as directed 20 mLs in the mouth or throat as needed for mouth pain. (Patient not taking: Reported on 10/11/2017), Disp: 100 mL, Rfl: 0 .  ondansetron (ZOFRAN) 4 MG tablet, Take 1 tablet (4 mg total) by mouth every 8 (eight) hours as needed for nausea or vomiting. (Patient not taking: Reported on 10/11/2017), Disp: 20 tablet, Rfl: 0  Past  Medical History: Past Medical History:  Diagnosis Date  . CAD (coronary artery disease), native coronary artery 09/20/2017  . Diabetes mellitus without complication (HCC)   . Hyperlipidemia   . Hypertension     Tobacco Use: Social History   Tobacco Use  Smoking Status Former Smoker  . Last attempt to quit: 07/22/1998  . Years since quitting: 19.2  Smokeless Tobacco Never Used    Labs: Recent Hydrographic surveyor    Labs for ITP Cardiac and Pulmonary Rehab Latest Ref Rng & Units 07/23/2017 07/23/2017 07/23/2017 07/24/2017 07/24/2017   Cholestrol 0 - 200 mg/dL - - - - -   LDLCALC 0 - 99 mg/dL - - - - -   HDL >29 mg/dL - - - - -   Trlycerides <150 mg/dL - - - - -   Hemoglobin A1c 4.8 - 5.6 % - - - - -   PHART 7.350 - 7.450 7.372 - 7.345(L) - -   PCO2ART 32.0 - 48.0 mmHg 34.0 - 32.2 - -   HCO3 20.0 - 28.0 mmol/L 19.8(L) - 17.5(L) - -   TCO2 22 - 32 mmol/L 21(L) 21(L) 19(L) 13(L) 23   ACIDBASEDEF 0.0 - 2.0 mmol/L 5.0(H) - 7.0(H) - -   O2SAT % 99.0 - 91.0 - -      Capillary Blood Glucose: Lab Results  Component Value Date   GLUCAP 114 (H) 07/30/2017   GLUCAP 113 (H)  07/30/2017   GLUCAP 135 (H) 07/29/2017   GLUCAP 139 (H) 07/29/2017   GLUCAP 96 07/29/2017     Exercise Target Goals: Date: 10/11/17  Exercise Program Goal: Individual exercise prescription set using results from initial 6 min walk test and THRR while considering  patient's activity barriers and safety.   Exercise Prescription Goal: Initial exercise prescription builds to 30-45 minutes a day of aerobic activity, 2-3 days per week.  Home exercise guidelines will be given to patient during program as part of exercise prescription that the participant will acknowledge.  Activity Barriers & Risk Stratification: Activity Barriers & Cardiac Risk Stratification - 10/11/17 0917    Activity Barriers & Cardiac Risk Stratification          Activity Barriers  None    Cardiac Risk Stratification  High           6  Minute Walk: 6 Minute Walk    6 Minute Walk    Row Name 10/11/17 1239   Phase  Initial   Distance  1647 feet   Walk Time  6 minutes   # of Rest Breaks  0   MPH  3.1   METS  4.1   RPE  11   VO2 Peak  14.5   Symptoms  No   Resting HR  67 bpm   Resting BP  112/68   Resting Oxygen Saturation   99 %   Exercise Oxygen Saturation  during 6 min walk  100 %   Max Ex. HR  89 bpm   Max Ex. BP  120/60   2 Minute Post BP  104/60          Oxygen Initial Assessment:   Oxygen Re-Evaluation:   Oxygen Discharge (Final Oxygen Re-Evaluation):   Initial Exercise Prescription: Initial Exercise Prescription - 10/11/17 1200    Date of Initial Exercise RX and Referring Provider          Date  10/11/17    Referring Provider  Armanda Magicurner, Traci MD    Expected Discharge Date  01/13/18        Bike          Level  0.8    Minutes  10    METs  2.89        NuStep          Level  3    SPM  80    Minutes  10    METs  2        Track          Laps  11    Minutes  10    METs  2.92        Prescription Details          Frequency (times per week)  3    Duration  Progress to 30 minutes of continuous aerobic without signs/symptoms of physical distress        Intensity          THRR 40-80% of Max Heartrate  66-132    Ratings of Perceived Exertion  11-13    Perceived Dyspnea  0-4        Progression          Progression  Continue to progress workloads to maintain intensity without signs/symptoms of physical distress.        Resistance Training          Training Prescription  Yes    Weight  3lbs    Reps  10-15           Perform Capillary Blood Glucose checks as needed.  Exercise Prescription Changes:   Exercise Comments:   Exercise Goals and Review: Exercise Goals    Exercise Goals    Row Name 10/11/17 0918 10/11/17 1240   Increase Physical Activity  Yes  no documentation   Intervention  Provide advice, education, support and counseling about physical  activity/exercise needs.;Develop an individualized exercise prescription for aerobic and resistive training based on initial evaluation findings, risk stratification, comorbidities and participant's personal goals.  no documentation   Expected Outcomes  Short Term: Attend rehab on a regular basis to increase amount of physical activity.;Long Term: Add in home exercise to make exercise part of routine and to increase amount of physical activity.;Long Term: Exercising regularly at least 3-5 days a week.  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  no documentation   Expected Outcomes  Long Term: Improve cardiorespiratory fitness, muscular endurance and strength as measured by increased METs and functional capacity ( );Short Term: Perform resistance training exercises routinely during rehab and add in resistance training at home;Short Term: Increase workloads from initial exercise prescription for resistance, speed, and METs.  no documentation   Able to understand and use rate of perceived exertion (RPE) scale  Yes  no documentation   Intervention  Provide education and explanation on how to use RPE scale  no documentation   Expected Outcomes  Short Term: Able to use RPE daily in rehab to express subjective intensity level;Long Term:  Able to use RPE to guide intensity level when exercising independently  no documentation   Knowledge and understanding of Target Heart Rate Range (THRR)  Yes  no documentation   Intervention  Provide education and explanation of THRR including how the numbers were predicted and  where they are located for reference  no documentation   Expected Outcomes  Short Term: Able to state/look up THRR;Long Term: Able to use THRR to govern intensity when exercising independently;Short Term: Able to use daily as guideline for intensity in rehab  no documentation   Able to check pulse independently  Yes  no documentation   Intervention  Provide education and demonstration on how to check pulse in carotid and radial arteries.;Review the importance of being able to check your own pulse for safety during independent exercise  no documentation   Expected Outcomes  Short Term: Able to explain why pulse checking is important during independent exercise;Long Term: Able to check pulse independently and accurately  no documentation   Understanding of Exercise Prescription  Yes  no documentation   Intervention  Provide education, explanation, and written materials on patient's individual exercise prescription  no documentation   Expected Outcomes  Long Term: Able to explain home exercise prescription to exercise independently;Short Term: Able to explain program exercise prescription  no documentation          Exercise Goals Re-Evaluation :    Discharge Exercise Prescription (Final Exercise Prescription Changes):   Nutrition:  Target Goals: Understanding of nutrition guidelines, daily intake of sodium 1500mg , cholesterol 200mg , calories 30% from fat and 7% or less from saturated fats, daily to have 5 or  more servings of fruits and vegetables.  Biometrics: Pre Biometrics - 10/11/17 1230    Pre Biometrics          Height  5\' 7"  (1.702 m)    Weight  177 lb 0.5 oz (80.3 kg)    Waist Circumference  39.5 inches    Hip Circumference  39.5 inches    Waist to Hip Ratio  1 %    BMI (Calculated)  27.72    Triceps Skinfold  25 mm    % Body Fat  29.1 %    Grip Strength  45 kg    Flexibility  8 in    Single Leg Stand  30 seconds            Nutrition Therapy Plan and Nutrition  Goals:   Nutrition Assessments:   Nutrition Goals Re-Evaluation:   Nutrition Goals Re-Evaluation:   Nutrition Goals Discharge (Final Nutrition Goals Re-Evaluation):   Psychosocial: Target Goals: Acknowledge presence or absence of significant depression and/or stress, maximize coping skills, provide positive support system. Participant is able to verbalize types and ability to use techniques and skills needed for reducing stress and depression.  Initial Review & Psychosocial Screening: Initial Psych Review & Screening - 10/11/17 1149    Initial Review          Current issues with  None Identified        Family Dynamics          Good Support System?  No        Barriers          Psychosocial barriers to participate in program  There are no identifiable barriers or psychosocial needs.        Screening Interventions          Interventions  Encouraged to exercise           Quality of Life Scores: Quality of Life - 10/11/17 1157    Quality of Life          Select  Quality of Life        Quality of Life Scores          Health/Function Pre  29.2 %    Socioeconomic Pre  18.75 %    Psych/Spiritual Pre  30 %    Family Pre  30 %    GLOBAL Pre  27.09 %          Scores of 19 and below usually indicate a poorer quality of life in these areas.  A difference of  2-3 points is a clinically meaningful difference.  A difference of 2-3 points in the total score of the Quality of Life Index has been associated with significant improvement in overall quality of life, self-image, physical symptoms, and general health in studies assessing change in quality of life.  PHQ-9: Recent Review Flowsheet Data    There is no flowsheet data to display.     Interpretation of Total Score  Total Score Depression Severity:  1-4 = Minimal depression, 5-9 = Mild depression, 10-14 = Moderate depression, 15-19 = Moderately severe depression, 20-27 = Severe depression   Psychosocial  Evaluation and Intervention:   Psychosocial Re-Evaluation:   Psychosocial Discharge (Final Psychosocial Re-Evaluation):   Vocational Rehabilitation: Provide vocational rehab assistance to qualifying candidates.   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 Participant will state understanding/return demonstration of topics presented.  Learning Barriers/Preferences: Learning Barriers/Preferences - 10/11/17 0917    Learning Barriers/Preferences          Learning Barriers  None    Learning Preferences  Skilled Demonstration           Education Topics: Count Your Pulse:  -Group instruction provided by verbal instruction, demonstration, patient participation and written materials to support subject.  Instructors address importance of being able to find your pulse and how to count your pulse when at home without a heart monitor.  Patients get hands on experience counting their pulse with staff help and individually.   Heart Attack, Angina, and Risk Factor Modification:  -Group instruction provided by verbal instruction, video, and written materials to support subject.  Instructors address signs and symptoms of angina and heart attacks.    Also discuss risk factors for heart disease and how to make changes to improve heart health risk factors.   Functional Fitness:  -Group instruction provided by verbal instruction, demonstration, patient participation, and written materials to support subject.  Instructors address safety measures for doing things around the house.  Discuss how to get up and down off the floor, how to pick things up properly, how to safely get  out of a chair without assistance, and balance training.   Meditation and Mindfulness:  -Group instruction provided by verbal instruction, patient participation, and written materials to support subject.  Instructor addresses importance of mindfulness and meditation practice to help reduce stress and improve awareness.  Instructor also leads participants through a meditation exercise.    Stretching for Flexibility and Mobility:  -Group instruction provided by verbal instruction, patient participation, and written materials to support subject.  Instructors lead participants through series of stretches that are designed to increase flexibility thus improving mobility.  These stretches are additional exercise for major muscle groups that are typically performed during regular warm up and cool down.   Hands Only CPR:  -Group verbal, video, and participation provides a basic overview of AHA guidelines for community CPR. Role-play of emergencies allow participants the opportunity to practice calling for help and chest compression technique with discussion of AED use.   Hypertension: -Group verbal and written instruction that provides a basic overview of hypertension including the most recent diagnostic guidelines, risk factor reduction with self-care instructions and medication management.    Nutrition I class: Heart Healthy Eating:  -Group instruction provided by PowerPoint slides, verbal discussion, and written materials to support subject matter. The instructor gives an explanation and review of the Therapeutic Lifestyle Changes diet recommendations, which includes a discussion on lipid goals, dietary fat, sodium, fiber, plant stanol/sterol esters, sugar, and the components of a well-balanced, healthy diet.   Nutrition II class: Lifestyle Skills:  -Group instruction provided by PowerPoint slides, verbal discussion, and written materials to support subject matter. The instructor gives an  explanation and review of label reading, grocery shopping for heart health, heart healthy recipe modifications, and ways to make healthier choices when eating out.   Diabetes Question & Answer:  -Group instruction provided by PowerPoint slides, verbal discussion, and written materials to support subject matter. The instructor gives an explanation and review of diabetes co-morbidities, pre- and post-prandial blood glucose goals, pre-exercise blood glucose goals, signs, symptoms,  and treatment of hypoglycemia and hyperglycemia, and foot care basics.   Diabetes Blitz:  -Group instruction provided by PowerPoint slides, verbal discussion, and written materials to support subject matter. The instructor gives an explanation and review of the physiology behind type 1 and type 2 diabetes, diabetes medications and rational behind using different medications, pre- and post-prandial blood glucose recommendations and Hemoglobin A1c goals, diabetes diet, and exercise including blood glucose guidelines for exercising safely.    Portion Distortion:  -Group instruction provided by PowerPoint slides, verbal discussion, written materials, and food models to support subject matter. The instructor gives an explanation of serving size versus portion size, changes in portions sizes over the last 20 years, and what consists of a serving from each food group.   Stress Management:  -Group instruction provided by verbal instruction, video, and written materials to support subject matter.  Instructors review role of stress in heart disease and how to cope with stress positively.     Exercising on Your Own:  -Group instruction provided by verbal instruction, power point, and written materials to support subject.  Instructors discuss benefits of exercise, components of exercise, frequency and intensity of exercise, and end points for exercise.  Also discuss use of nitroglycerin and activating EMS.  Review options of places to  exercise outside of rehab.  Review guidelines for sex with heart disease.   Cardiac Drugs I:  -Group instruction provided by verbal instruction and written materials to support subject.  Instructor reviews cardiac drug classes: antiplatelets, anticoagulants, beta blockers, and statins.  Instructor discusses reasons, side effects, and lifestyle considerations for each drug class.   Cardiac Drugs II:  -Group instruction provided by verbal instruction and written materials to support subject.  Instructor reviews cardiac drug classes: angiotensin converting enzyme inhibitors (ACE-I), angiotensin II receptor blockers (ARBs), nitrates, and calcium channel blockers.  Instructor discusses reasons, side effects, and lifestyle considerations for each drug class.   Anatomy and Physiology of the Circulatory System:  Group verbal and written instruction and models provide basic cardiac anatomy and physiology, with the coronary electrical and arterial systems. Review of: AMI, Angina, Valve disease, Heart Failure, Peripheral Artery Disease, Cardiac Arrhythmia, Pacemakers, and the ICD.   Other Education:  -Group or individual verbal, written, or video instructions that support the educational goals of the cardiac rehab program.   Holiday Eating Survival Tips:  -Group instruction provided by PowerPoint slides, verbal discussion, and written materials to support subject matter. The instructor gives patients tips, tricks, and techniques to help them not only survive but enjoy the holidays despite the onslaught of food that accompanies the holidays.   Knowledge Questionnaire Score: Knowledge Questionnaire Score - 10/11/17 1147    Knowledge Questionnaire Score          Pre Score  22/28           Core Components/Risk Factors/Patient Goals at Admission: Personal Goals and Risk Factors at Admission - 10/11/17 1240    Core Components/Risk Factors/Patient Goals on Admission           Weight Management   Yes;Weight Maintenance;Weight Loss    Intervention  Weight Management: Develop a combined nutrition and exercise program designed to reach desired caloric intake, while maintaining appropriate intake of nutrient and fiber, sodium and fats, and appropriate energy expenditure required for the weight goal.;Weight Management: Provide education and appropriate resources to help participant work on and attain dietary goals.    Admit Weight  177 lb 0.5 oz (80.3 kg)  Goal Weight: Short Term  170 lb (77.1 kg)    Goal Weight: Long Term  165 lb (74.8 kg)    Expected Outcomes  Short Term: Continue to assess and modify interventions until short term weight is achieved;Long Term: Adherence to nutrition and physical activity/exercise program aimed toward attainment of established weight goal;Weight Maintenance: Understanding of the daily nutrition guidelines, which includes 25-35% calories from fat, 7% or less cal from saturated fats, less than 200mg  cholesterol, less than 1.5gm of sodium, & 5 or more servings of fruits and vegetables daily;Weight Loss: Understanding of general recommendations for a balanced deficit meal plan, which promotes 1-2 lb weight loss per week and includes a negative energy balance of [phone] kcal/d;Understanding recommendations for meals to include 15-35% energy as protein, 25-35% energy from fat, 35-60% energy from carbohydrates, less than 200mg  of dietary cholesterol, 20-35 gm of total fiber daily;Understanding of distribution of calorie intake throughout the day with the consumption of 4-5 meals/snacks    Hypertension  Yes    Intervention  Provide education on lifestyle modifcations including regular physical activity/exercise, weight management, moderate sodium restriction and increased consumption of fresh fruit, vegetables, and low fat dairy, alcohol moderation, and smoking cessation.;Monitor prescription use compliance.    Expected Outcomes  Short Term: Continued assessment and  intervention until BP is < 140/30mm HG in hypertensive participants. < 130/1mm HG in hypertensive participants with diabetes, heart failure or chronic kidney disease.;Long Term: Maintenance of blood pressure at goal levels.    Lipids  Yes    Intervention  Provide education and support for participant on nutrition & aerobic/resistive exercise along with prescribed medications to achieve LDL 70mg , HDL >40mg .    Expected Outcomes  Short Term: Participant states understanding of desired cholesterol values and is compliant with medications prescribed. Participant is following exercise prescription and nutrition guidelines.;Long Term: Cholesterol controlled with medications as prescribed, with individualized exercise RX and with personalized nutrition plan. Value goals: LDL < 70mg , HDL > 40 mg.           Core Components/Risk Factors/Patient Goals Review:    Core Components/Risk Factors/Patient Goals at Discharge (Final Review):    ITP Comments: ITP Comments    Row Name 10/11/17 0913   ITP Comments  Dr. Armanda Magic, Medical Director      Comments: Patient attended orientation from [phone] to 1030 to review rules and guidelines for program. Completed 6 minute walk test, Intitial ITP, and exercise prescription.  VSS. Telemetry-sinus rhythm.  Asymptomatic. Deveron Furlong, RN, BSN Cardiac Pulmonary Rehab 10/11/17 12:47 PM

## 2017-10-15 ENCOUNTER — Encounter (HOSPITAL_COMMUNITY)
Admission: RE | Admit: 2017-10-15 | Discharge: 2017-10-15 | Disposition: A | Discharge status: Home / Self Care | Payer: BLUE CROSS/BLUE SHIELD | Source: Ambulatory Visit | Attending: Cardiology | Admitting: Cardiology

## 2017-10-15 ENCOUNTER — Encounter (HOSPITAL_COMMUNITY): Payer: Self-pay

## 2017-10-15 ENCOUNTER — Encounter (HOSPITAL_COMMUNITY): Payer: BLUE CROSS/BLUE SHIELD

## 2017-10-15 DIAGNOSIS — I214 Non-ST elevation (NSTEMI) myocardial infarction: Secondary | ICD-10-CM

## 2017-10-15 DIAGNOSIS — Z951 Presence of aortocoronary bypass graft: Secondary | ICD-10-CM | POA: Diagnosis not present

## 2017-10-15 LAB — GLUCOSE, CAPILLARY
GLUCOSE-CAPILLARY: 135 mg/dL — AB (ref 70–99)
GLUCOSE-CAPILLARY: 109 mg/dL — AB (ref 70–99)

## 2017-10-15 NOTE — Progress Notes (Signed)
Daily Session Note  Patient Details  Name: [name] MRN: [id number] Date of Birth: 03/03/1961 Referring Provider:     CARDIAC REHAB PHASE II ORIENTATION from 10/11/2017 in [place]  Referring Provider  Fransico Him MD      Encounter Date: 10/15/2017  Check In: Session Check In - 10/15/17 0912      Check-In   Location  [facility]    Staff Present  Barnet Pall, RN, BSN;Amber Fair, MS, ACSM RCEP, Exercise Physiologist;Jearldean Gutt Karle Starch, RN BSN;Tyara Carol Ada, MS,ACSM CEP, Exercise Physiologist    Supervising physician immediately available to respond to emergencies  [facility] immediately available    Physician(s)  Dr. Darrick Meigs    Medication changes reported      No    Fall or balance concerns reported     No    Tobacco Cessation  No Change    Warm-up and Cool-down  Performed as group-led instruction    Resistance Training Performed  Yes    VAD Patient?  No    PAD/SET Patient?  No      Pain Assessment   Currently in Pain?  No/denies    Multiple Pain Sites  No       Capillary Blood Glucose: Results for orders placed or performed during the hospital encounter of 10/15/17 (from the past 24 hour(s))  Glucose, capillary     Status: Abnormal   Collection Time: 10/15/17  9:23 AM  Result Value Ref Range   Glucose-Capillary 109 (H) 70 - 99 mg/dL      Social History   Tobacco Use  Smoking Status Former Smoker  . Last attempt to quit: 07/22/1998  . Years since quitting: 19.2  Smokeless Tobacco Never Used    Goals Met:  Exercise tolerated well  Goals Unmet:  Not Applicable  Comments: Pt started cardiac rehab today.  Pt tolerated light exercise without difficulty. VSS, telemetry-SR, asymptomatic.  Medication list reconciled. Pt denies barriers to medicaiton compliance.  PSYCHOSOCIAL ASSESSMENT:  PHQ-0. Pt exhibits positive coping skills, hopeful outlook with supportive family. No psychosocial needs identified at this  time, no psychosocial interventions necessary.   Pt oriented to exercise equipment and routine.    Understanding verbalized.  Interpreter present during session.   Dr. Fransico Him is Medical Director for Cardiac Rehab at [facility].

## 2017-10-17 ENCOUNTER — Encounter (HOSPITAL_COMMUNITY)
Admission: RE | Admit: 2017-10-17 | Discharge: 2017-10-17 | Disposition: A | Discharge status: Home / Self Care | Payer: BLUE CROSS/BLUE SHIELD | Source: Ambulatory Visit | Attending: Cardiology | Admitting: Cardiology

## 2017-10-17 ENCOUNTER — Encounter (HOSPITAL_COMMUNITY): Payer: BLUE CROSS/BLUE SHIELD

## 2017-10-17 DIAGNOSIS — I214 Non-ST elevation (NSTEMI) myocardial infarction: Secondary | ICD-10-CM

## 2017-10-17 DIAGNOSIS — Z951 Presence of aortocoronary bypass graft: Secondary | ICD-10-CM

## 2017-10-17 LAB — GLUCOSE, CAPILLARY
GLUCOSE-CAPILLARY: 109 mg/dL — AB (ref 70–99)
Glucose-Capillary: 179 mg/dL — ABNORMAL HIGH (ref 70–99)

## 2017-10-18 ENCOUNTER — Ambulatory Visit (INDEPENDENT_AMBULATORY_CARE_PROVIDER_SITE_OTHER): Payer: BLUE CROSS/BLUE SHIELD | Admitting: Cardiology

## 2017-10-18 ENCOUNTER — Encounter: Payer: Self-pay | Admitting: Cardiology

## 2017-10-18 VITALS — BP 122/70 | HR 67 | Ht 67.0 in | Wt 174.8 lb

## 2017-10-18 DIAGNOSIS — Z951 Presence of aortocoronary bypass graft: Secondary | ICD-10-CM | POA: Diagnosis not present

## 2017-10-18 DIAGNOSIS — I4891 Unspecified atrial fibrillation: Secondary | ICD-10-CM

## 2017-10-18 NOTE — Progress Notes (Signed)
10/18/2017 [name]   [birth year]  [id number]  Primary Physician Sandre Kitty, PA-C Primary Cardiologist: Dr. Mayford Knife   Reason for Visit/CC: Follow-up for postoperative atrial fibrillation  HPI:  [name] is a 57 y.o. male with a hx of NSTEMI found to have severe three-vessel coronary artery disease and underwent CABG x5 with LIMA to the LAD, SVG to diagonal, SVG to OM1 and OM 2, SVG to RCA 07/24/2017.  He also had postop atrial fibrillation and was placed on amiodarone and apixaban. Patient also has hyperlipidemia and diabetes.   He works in [employer] but has not yet returned to work.  He just recently enrolled in cardiac rehab.  He has had 2 sessions and reports that this is going well.   He was seen by Dr. Mayford Knife last month in June for cardiac evaluation and was noted to be stable from a cardiac standpoint.  He denied any recurrent chest pain or dyspnea.  He also denied palpitations.  However, Dr. Mayford Knife recommended that he wear a 30-day event monitor to check for silent atrial fibrillation prior to making a decision regarding duration with amiodarone and apixaban.  He presents today for follow-up and is here with an interpreter.  Unfortunately, he is still wearing the cardiac monitor and has not completed a full 30-day assessment.  Thus, no results have been generated at this time.  Since his last office visit ,he reports that he is done well without any recurrent cardiac symptoms.  No chest pain, dyspnea, palpitations, lightheadedness, dizziness, syncope/near syncope.  He reports full medication compliance.  No intolerances.  He denies any abnormal bleeding with Eliquis.  Blood pressure is well controlled at 122/70.  Pulse rate is controlled at 67 bpm.  Physical exam reveals regular rate and rhythm.   Current Meds  Medication Sig  . acetaminophen (TYLENOL) 500 MG tablet Take 1 tablet (500 mg total) by mouth every 6 (six) hours as needed.  Marland Kitchen amiodarone (PACERONE) 200 MG  tablet Take 1 tablet (200 mg total) by mouth daily.  Marland Kitchen apixaban (ELIQUIS) 5 MG TABS tablet Take 1 tablet (5 mg total) by mouth 2 (two) times daily.  Marland Kitchen aspirin EC 81 MG EC tablet Take 1 tablet (81 mg total) by mouth daily.  Marland Kitchen atorvastatin (LIPITOR) 80 MG tablet Take 1 tablet (80 mg total) by mouth daily at 6 PM.  . lidocaine (XYLOCAINE) 2 % solution Use as directed 20 mLs in the mouth or throat as needed for mouth pain.  Marland Kitchen losartan-hydrochlorothiazide (HYZAAR) 100-25 MG tablet Take 1 tablet by mouth daily.  . metFORMIN (GLUCOPHAGE) 1000 MG tablet Take 1,000 mg by mouth 2 (two) times daily.  . metoprolol tartrate (LOPRESSOR) 50 MG tablet Take 1 tablet (50 mg total) by mouth 2 (two) times daily.  . ondansetron (ZOFRAN) 4 MG tablet Take 1 tablet (4 mg total) by mouth every 8 (eight) hours as needed for nausea or vomiting.   Allergies  Allergen Reactions  . Tape    Past Medical History:  Diagnosis Date  . CAD (coronary artery disease), native coronary artery 09/20/2017  . Diabetes mellitus without complication (HCC)   . Hyperlipidemia   . Hypertension    Family History  Problem Relation Age of Onset  . Heart disease Mother    Past Surgical History:  Procedure Laterality Date  . CORONARY ARTERY BYPASS GRAFT N/A 07/23/2017   Procedure: CORONARY ARTERY BYPASS GRAFTING (CABG) x Five , using left internal mammary artery and right leg greater saphenous  vein harvested endoscopically;  Surgeon: Delight OvensGerhardt, Edward B, MD;  [facility];  Service: Open Heart Surgery;  Laterality: N/A;  . LEFT HEART CATH AND CORONARY ANGIOGRAPHY N/A 07/22/2017   Procedure: LEFT HEART CATH AND CORONARY ANGIOGRAPHY;  Surgeon: SwazilandJordan, Peter M, MD;  [facility];  Service: Cardiovascular;  Laterality: N/A;  . TEE WITHOUT CARDIOVERSION N/A 07/23/2017   Procedure: TRANSESOPHAGEAL ECHOCARDIOGRAM (TEE);  Surgeon: Delight OvensGerhardt, Edward B, MD;  [facility];  Service: Open Heart Surgery;  Laterality: N/A;   Social  History   Socioeconomic History  . Marital status: Married    Spouse name: Not on file  . Number of children: Not on file  . Years of education: Not on file  . Highest education level: Not on file  Occupational History  . Not on file  Social Needs  . Financial resource strain: Not on file  . Food insecurity:    Worry: Not on file    Inability: Not on file  . Transportation needs:    Medical: Not on file    Non-medical: Not on file  Tobacco Use  . Smoking status: Former Smoker    Last attempt to quit: 07/22/1998    Years since quitting: 19.2  . Smokeless tobacco: Never Used  Substance and Sexual Activity  . Alcohol use: Yes  . Drug use: No  . Sexual activity: Not on file  Lifestyle  . Physical activity:    Days per week: Not on file    Minutes per session: Not on file  . Stress: Not on file  Relationships  . Social connections:    Talks on phone: Not on file    Gets together: Not on file    Attends religious service: Not on file    Active member of club or organization: Not on file    Attends meetings of clubs or organizations: Not on file    Relationship status: Not on file  . Intimate partner violence:    Fear of current or ex partner: Not on file    Emotionally abused: Not on file    Physically abused: Not on file    Forced sexual activity: Not on file  Other Topics Concern  . Not on file  Social History Narrative  . Not on file     Review of Systems: General: negative for chills, fever, night sweats or weight changes.  Cardiovascular: negative for chest pain, dyspnea on exertion, edema, orthopnea, palpitations, paroxysmal nocturnal dyspnea or shortness of breath Dermatological: negative for rash Respiratory: negative for cough or wheezing Urologic: negative for hematuria Abdominal: negative for nausea, vomiting, diarrhea, bright red blood per rectum, melena, or hematemesis Neurologic: negative for visual changes, syncope, or dizziness All other systems  reviewed and are otherwise negative except as noted above.   Physical Exam:  Blood pressure 122/70, pulse 67, height 5\' 7"  (1.702 m), weight 174 lb 12.8 oz (79.3 kg), SpO2 99 %.  General appearance: alert, cooperative and no distress Neck: no carotid bruit and no JVD Lungs: clear to auscultation bilaterally Heart: regular rate and rhythm, S1, S2 normal, no murmur, click, rub or gallop Extremities: extremities normal, atraumatic, no cyanosis or edema Pulses: 2+ and symmetric Skin: Skin color, texture, turgor normal. No rashes or lesions Neurologic: Grossly normal  EKG not performed-- personally reviewed   ASSESSMENT AND PLAN:   1. CAD: status post NSTEMI found to have severe three-vessel coronary artery disease and underwent CABG x5 with LIMA to the LAD,  SVG to diagonal, SVG to OM1 and OM 2, SVG to RCA 07/24/2017. Stable w/o CP.  Continue aspirin, statin, beta-blocker and ARB.  Continue to advance in cardiac rehab.  2.  Postoperative atrial flutter ablation: He remains on amiodarone and Eliquis.  He has been ordered by Dr. Mayford Knife to wear a 30-day cardiac monitor to rule out silent atrial fibrillation.  He is still currently wearing the monitor.  Evaluation will be completed in 10 days.  If monitor results show no recurrence of atrial fibrillation and no atrial flutter then plan will be to discontinue amiodarone and apixaban.  However if he is discovered to have recurrent atrial fibrillation then we will need to continue therapy.  3.  Hypertension: Blood pressure is well controlled on current regimen.  4.  Hyperlipidemia: Continue statin therapy with Lipitor 80 mg.  Follow-Up w/ Dr. Mayford Knife in 6 months.   [name] Delmer Islam, MHS [facility] 10/18/2017 9:34 AM

## 2017-10-18 NOTE — Patient Instructions (Signed)
Medication Instructions: Your physician recommends that you continue on your current medications as directed. Please refer to the Current Medication list given to you today.   Labwork: None Orderd  Procedures/Testing: None Ordered  Follow-Up: Your physician wants you to follow-up in: 6 months with Dr. Sherlyn Lickurner You will receive a reminder letter in the mail two months in advance. If you don't receive a letter, please call our office to schedule the follow-up appointment.    Any Additional Special Instructions Will Be Listed Below (If Applicable).  When you are done wearing the Event monitor be sure to mail it back in.    If you need a refill on your cardiac medications before your next appointment, please call your pharmacy.  '

## 2017-10-19 ENCOUNTER — Encounter (HOSPITAL_COMMUNITY): Payer: BLUE CROSS/BLUE SHIELD

## 2017-10-19 ENCOUNTER — Encounter (HOSPITAL_COMMUNITY)
Admission: RE | Admit: 2017-10-19 | Discharge: 2017-10-19 | Disposition: A | Discharge status: Home / Self Care | Payer: BLUE CROSS/BLUE SHIELD | Source: Ambulatory Visit | Attending: Cardiology | Admitting: Cardiology

## 2017-10-19 DIAGNOSIS — Z951 Presence of aortocoronary bypass graft: Secondary | ICD-10-CM

## 2017-10-19 DIAGNOSIS — I214 Non-ST elevation (NSTEMI) myocardial infarction: Secondary | ICD-10-CM

## 2017-10-22 ENCOUNTER — Encounter (HOSPITAL_COMMUNITY)
Admission: RE | Admit: 2017-10-22 | Discharge: 2017-10-22 | Disposition: A | Discharge status: Home / Self Care | Payer: BLUE CROSS/BLUE SHIELD | Source: Ambulatory Visit | Attending: Cardiology | Admitting: Cardiology

## 2017-10-22 ENCOUNTER — Encounter (HOSPITAL_COMMUNITY): Payer: BLUE CROSS/BLUE SHIELD

## 2017-10-22 DIAGNOSIS — I214 Non-ST elevation (NSTEMI) myocardial infarction: Secondary | ICD-10-CM

## 2017-10-22 DIAGNOSIS — Z951 Presence of aortocoronary bypass graft: Secondary | ICD-10-CM

## 2017-10-24 ENCOUNTER — Encounter (HOSPITAL_COMMUNITY): Payer: BLUE CROSS/BLUE SHIELD

## 2017-10-24 ENCOUNTER — Encounter (HOSPITAL_COMMUNITY)
Admission: RE | Admit: 2017-10-24 | Discharge: 2017-10-24 | Disposition: A | Discharge status: Home / Self Care | Payer: BLUE CROSS/BLUE SHIELD | Source: Ambulatory Visit | Attending: Cardiology | Admitting: Cardiology

## 2017-10-24 DIAGNOSIS — Z951 Presence of aortocoronary bypass graft: Secondary | ICD-10-CM | POA: Diagnosis not present

## 2017-10-24 DIAGNOSIS — I214 Non-ST elevation (NSTEMI) myocardial infarction: Secondary | ICD-10-CM

## 2017-10-24 NOTE — Progress Notes (Signed)
[name] 57 y.o. male DOB: 07/03/1960 MRN: [id number]      Nutrition Note  1. S/P CABG x 5   2. NSTEMI (non-ST elevated myocardial infarction) [facility](HCC)    Past Medical History:  Diagnosis Date  . CAD (coronary artery disease), native coronary artery 09/20/2017  . Diabetes mellitus without complication (HCC)   . Hyperlipidemia   . Hypertension    Meds reviewed. Lopressor, glucophage, lipitor noted  HT: Ht Readings from Last 1 Encounters:  10/18/17 5\' 7"  (1.702 m)    WT: Wt Readings from Last 5 Encounters:  10/18/17 174 lb 12.8 oz (79.3 kg)  10/11/17 177 lb 0.5 oz (80.3 kg)  09/20/17 173 lb (78.5 kg)  08/28/17 166 lb (75.3 kg)  08/09/17 173 lb (78.5 kg)      Current tobacco use?  no       Labs:  Lipid Panel     Component Value Date/Time   CHOL 103 07/22/2017 0303   TRIG 60 07/22/2017 0303   HDL 30 (L) 07/22/2017 0303   CHOLHDL 3.4 07/22/2017 0303   VLDL 12 07/22/2017 0303   LDLCALC 61 07/22/2017 0303    Lab Results  Component Value Date   HGBA1C 6.7 (H) 07/21/2017   CBG (last 3)  No results for input(s): GLUCAP in the last 72 hours.  Nutrition Note Spoke with pt. Nutrition plan and goals reviewed with pt. Pt is following Step 2 of the Therapeutic Lifestyle Changes diet. Pt wants to loose weight, wt loss tips discussed. Pt is diabetic. Last A1c indicates blood glucose well-controlled. This Clinical research associatewriter went over Diabetes Education test results. Pt checks CBG's 1-2 times a day. Fasting CBG's reportedly 110-130 mg/dL. Per discussion, pt does not use canned/convenience foods often. Pt rarely adds salt to food. Pt eats out infrequently. Pt does indulge in sweets and uses some refined grains. Discussed label reading and distributed label reading handout. Additionally reviewed serving sizes of foods, to help manage portions. Pt expressed understanding of the information reviewed. Pt aware of nutrition education classes offered and plans on attending nutrition classes as he is  available to attend.  Nutrition Diagnosis ? Food-and nutrition-related knowledge deficit related to lack of exposure to information as related to diagnosis of: ? CVD ? DM  ? Overweight related to excessive energy intake as evidenced by a 27.38   Nutrition Intervention ? Pt's individual nutrition plan and goals reviewed with pt. ? Pt given handouts for: ? Nutrition I class ? Nutrition II class  ? Diabetes Blitz Class ? Diabetes Q & A class  ? Consistent vit K diet ? low sodium ? DM ? pre-diabetes  Nutrition Goal(s):  ? Pt to eat more non-starchy vegetables and a variety of non-starchy vegetables ? Pt to identify food quantities necessary to achieve weight loss of 6-24 lbs   Plan:  ? Pt to attend nutrition classes ? Nutrition I ? Nutrition II ? Portion Distortion ? Diabetes Blitz ? Diabetes Q & A ? Will provide client-centered nutrition education as part of interdisciplinary care ? Monitor and evaluate progress toward nutrition goal with team.   Ross MarcusAubrey Burklin, MS, RD, LDN 10/24/2017 9:38 AM

## 2017-10-26 ENCOUNTER — Encounter (HOSPITAL_COMMUNITY)
Admission: RE | Admit: 2017-10-26 | Discharge: 2017-10-26 | Disposition: A | Discharge status: Home / Self Care | Payer: BLUE CROSS/BLUE SHIELD | Source: Ambulatory Visit | Attending: Cardiology | Admitting: Cardiology

## 2017-10-26 ENCOUNTER — Encounter (HOSPITAL_COMMUNITY): Payer: BLUE CROSS/BLUE SHIELD

## 2017-10-26 DIAGNOSIS — Z951 Presence of aortocoronary bypass graft: Secondary | ICD-10-CM | POA: Diagnosis not present

## 2017-10-26 DIAGNOSIS — I214 Non-ST elevation (NSTEMI) myocardial infarction: Secondary | ICD-10-CM

## 2017-10-26 NOTE — Progress Notes (Signed)
Reviewed home exercise with pt and interpretor  today.  Pt plans to walk for 30 minutes for exercise, 3x/week in addition to coming to cardiac rehab.  Reviewed THR, pulse, RPE, sign and symptoms, and when to call 911 or MD.  Also discussed weather considerations and indoor options.  Pt voiced understanding.   Warrick ParisianAmber Tyann Niehaus, MS,ACSM RCEP

## 2017-10-29 ENCOUNTER — Encounter (HOSPITAL_COMMUNITY): Payer: BLUE CROSS/BLUE SHIELD

## 2017-10-29 ENCOUNTER — Encounter (HOSPITAL_COMMUNITY)
Admission: RE | Admit: 2017-10-29 | Discharge: 2017-10-29 | Disposition: A | Discharge status: Home / Self Care | Payer: BLUE CROSS/BLUE SHIELD | Source: Ambulatory Visit | Attending: Cardiology | Admitting: Cardiology

## 2017-10-29 DIAGNOSIS — Z951 Presence of aortocoronary bypass graft: Secondary | ICD-10-CM | POA: Diagnosis not present

## 2017-10-29 DIAGNOSIS — I214 Non-ST elevation (NSTEMI) myocardial infarction: Secondary | ICD-10-CM

## 2017-10-31 ENCOUNTER — Encounter (HOSPITAL_COMMUNITY): Payer: BLUE CROSS/BLUE SHIELD

## 2017-10-31 ENCOUNTER — Encounter (HOSPITAL_COMMUNITY)
Admission: RE | Admit: 2017-10-31 | Discharge: 2017-10-31 | Disposition: A | Discharge status: Home / Self Care | Payer: BLUE CROSS/BLUE SHIELD | Source: Ambulatory Visit | Attending: Cardiology | Admitting: Cardiology

## 2017-10-31 DIAGNOSIS — Z951 Presence of aortocoronary bypass graft: Secondary | ICD-10-CM | POA: Diagnosis not present

## 2017-10-31 DIAGNOSIS — I214 Non-ST elevation (NSTEMI) myocardial infarction: Secondary | ICD-10-CM

## 2017-11-01 ENCOUNTER — Other Ambulatory Visit: Payer: Self-pay

## 2017-11-01 ENCOUNTER — Telehealth: Payer: Self-pay

## 2017-11-01 NOTE — Telephone Encounter (Signed)
Notes recorded by Sigurd Sosapp, Indi Willhite, RN on 11/01/2017 at 12:59 PM EDT Spoke with patient's daughter (DPR) and gave her results/recommendations to share with the patient. She verbalized understanding.

## 2017-11-01 NOTE — Telephone Encounter (Signed)
Notes recorded by Sigurd Sosapp, Madelynne Lasker, RN on 11/01/2017 at 12:41 PM EDT Spoke to patient who does not speak [place]. He will have a family member call us later to clarify Event monitor result and other recommendations. ------

## 2017-11-01 NOTE — Telephone Encounter (Signed)
-----   Message from [name], MD sent at 11/01/2017 10:05 AM EDT ----- Please let patient know that heart monitor was normal.

## 2017-11-01 NOTE — Telephone Encounter (Signed)
-----   Message from Traci R Turner, MD sent at 11/01/2017 10:05 AM EDT ----- Please let patient know that heart monitor was normal. 

## 2017-11-02 ENCOUNTER — Encounter (HOSPITAL_COMMUNITY): Payer: Self-pay

## 2017-11-02 ENCOUNTER — Encounter (HOSPITAL_COMMUNITY)
Admission: RE | Admit: 2017-11-02 | Discharge: 2017-11-02 | Disposition: A | Discharge status: Home / Self Care | Payer: BLUE CROSS/BLUE SHIELD | Source: Ambulatory Visit | Attending: Cardiology | Admitting: Cardiology

## 2017-11-02 ENCOUNTER — Encounter (HOSPITAL_COMMUNITY): Payer: BLUE CROSS/BLUE SHIELD

## 2017-11-02 DIAGNOSIS — E785 Hyperlipidemia, unspecified: Secondary | ICD-10-CM | POA: Diagnosis not present

## 2017-11-02 DIAGNOSIS — I10 Essential (primary) hypertension: Secondary | ICD-10-CM | POA: Insufficient documentation

## 2017-11-02 DIAGNOSIS — I2510 Atherosclerotic heart disease of native coronary artery without angina pectoris: Secondary | ICD-10-CM | POA: Insufficient documentation

## 2017-11-02 DIAGNOSIS — E119 Type 2 diabetes mellitus without complications: Secondary | ICD-10-CM | POA: Insufficient documentation

## 2017-11-02 DIAGNOSIS — Z951 Presence of aortocoronary bypass graft: Secondary | ICD-10-CM | POA: Diagnosis not present

## 2017-11-02 DIAGNOSIS — I214 Non-ST elevation (NSTEMI) myocardial infarction: Secondary | ICD-10-CM

## 2017-11-05 ENCOUNTER — Encounter (HOSPITAL_COMMUNITY)
Admission: RE | Admit: 2017-11-05 | Discharge: 2017-11-05 | Disposition: A | Discharge status: Home / Self Care | Payer: BLUE CROSS/BLUE SHIELD | Source: Ambulatory Visit | Attending: Cardiology | Admitting: Cardiology

## 2017-11-05 ENCOUNTER — Encounter (HOSPITAL_COMMUNITY): Payer: BLUE CROSS/BLUE SHIELD

## 2017-11-05 DIAGNOSIS — Z951 Presence of aortocoronary bypass graft: Secondary | ICD-10-CM | POA: Diagnosis not present

## 2017-11-05 DIAGNOSIS — I214 Non-ST elevation (NSTEMI) myocardial infarction: Secondary | ICD-10-CM

## 2017-11-07 ENCOUNTER — Encounter (HOSPITAL_COMMUNITY): Payer: BLUE CROSS/BLUE SHIELD

## 2017-11-07 ENCOUNTER — Encounter (HOSPITAL_COMMUNITY)
Admission: RE | Admit: 2017-11-07 | Discharge: 2017-11-07 | Disposition: A | Discharge status: Home / Self Care | Payer: BLUE CROSS/BLUE SHIELD | Source: Ambulatory Visit | Attending: Cardiology | Admitting: Cardiology

## 2017-11-07 DIAGNOSIS — Z951 Presence of aortocoronary bypass graft: Secondary | ICD-10-CM | POA: Diagnosis not present

## 2017-11-07 DIAGNOSIS — I214 Non-ST elevation (NSTEMI) myocardial infarction: Secondary | ICD-10-CM

## 2017-11-07 NOTE — Progress Notes (Signed)
Cardiac Individual Treatment Plan  Patient Details  Name: [name] MRN: [id number] Date of Birth: [birth year] Referring Provider:   Flowsheet Row CARDIAC REHAB PHASE II ORIENTATION from 10/11/2017 in [facility] CARDIAC REHAB  Referring Provider  Armanda Magic MD      Initial Encounter Date:  Flowsheet Row CARDIAC REHAB PHASE II ORIENTATION from 10/11/2017 in [facility] CARDIAC REHAB  Date  10/11/17      Visit Diagnosis: S/P CABG x 5  NSTEMI (non-ST elevated myocardial infarction) (HCC)  Patient's Home Medications on Admission:  Current Outpatient Medications:  .  acetaminophen (TYLENOL) 500 MG tablet, Take 1 tablet (500 mg total) by mouth every 6 (six) hours as needed., Disp: 30 tablet, Rfl: 0 .  aspirin EC 81 MG EC tablet, Take 1 tablet (81 mg total) by mouth daily., Disp: , Rfl:  .  atorvastatin (LIPITOR) 80 MG tablet, Take 1 tablet (80 mg total) by mouth daily at 6 PM., Disp: 90 tablet, Rfl: 3 .  lidocaine (XYLOCAINE) 2 % solution, Use as directed 20 mLs in the mouth or throat as needed for mouth pain., Disp: 100 mL, Rfl: 0 .  losartan-hydrochlorothiazide (HYZAAR) 100-25 MG tablet, Take 1 tablet by mouth daily., Disp: 90 tablet, Rfl: 3 .  metFORMIN (GLUCOPHAGE) 1000 MG tablet, Take 1,000 mg by mouth 2 (two) times daily., Disp: , Rfl: 2 .  metoprolol tartrate (LOPRESSOR) 50 MG tablet, Take 1 tablet (50 mg total) by mouth 2 (two) times daily., Disp: 180 tablet, Rfl: 3 .  ondansetron (ZOFRAN) 4 MG tablet, Take 1 tablet (4 mg total) by mouth every 8 (eight) hours as needed for nausea or vomiting., Disp: 20 tablet, Rfl: 0  Past Medical History: Past Medical History:  Diagnosis Date  . CAD (coronary artery disease), native coronary artery 09/20/2017  . Diabetes mellitus without complication (HCC)   . Hyperlipidemia   . Hypertension     Tobacco Use: Social History   Tobacco Use  Smoking Status Former Smoker  . Last attempt to quit:  07/22/1998  . Years since quitting: 19.3  Smokeless Tobacco Never Used    Labs: Recent Review Flowsheet Data    Labs for ITP Cardiac and Pulmonary Rehab Latest Ref Rng & Units 07/23/2017 07/23/2017 07/23/2017 07/24/2017 07/24/2017   Cholestrol 0 - 200 mg/dL - - - - -   LDLCALC 0 - 99 mg/dL - - - - -   HDL >40 mg/dL - - - - -   Trlycerides <150 mg/dL - - - - -   Hemoglobin A1c 4.8 - 5.6 % - - - - -   PHART 7.350 - 7.450 7.372 - 7.345(L) - -   PCO2ART 32.0 - 48.0 mmHg 34.0 - 32.2 - -   HCO3 20.0 - 28.0 mmol/L 19.8(L) - 17.5(L) - -   TCO2 22 - 32 mmol/L 21(L) 21(L) 19(L) 13(L) 23   ACIDBASEDEF 0.0 - 2.0 mmol/L 5.0(H) - 7.0(H) - -   O2SAT % 99.0 - 91.0 - -      Capillary Blood Glucose: Lab Results  Component Value Date   GLUCAP 109 (H) 10/17/2017   GLUCAP 179 (H) 10/17/2017   GLUCAP 109 (H) 10/15/2017   GLUCAP 135 (H) 10/15/2017   GLUCAP 114 (H) 07/30/2017     Exercise Target Goals:    Exercise Program Goal: Individual exercise prescription set using results from initial 6 min walk test and THRR while considering  patient's activity barriers and safety.   Exercise Prescription  Goal: Initial exercise prescription builds to 30-45 minutes a day of aerobic activity, 2-3 days per week.  Home exercise guidelines will be given to patient during program as part of exercise prescription that the participant will acknowledge.  Activity Barriers & Risk Stratification: Activity Barriers & Cardiac Risk Stratification - 10/11/17 0917    Activity Barriers & Cardiac Risk Stratification          Activity Barriers  None    Cardiac Risk Stratification  High           6 Minute Walk: 6 Minute Walk    6 Minute Walk    Row Name 10/11/17 1239   Phase  Initial   Distance  1647 feet   Walk Time  6 minutes   # of Rest Breaks  0   MPH  3.1   METS  4.1   RPE  11   VO2 Peak  14.5   Symptoms  No   Resting HR  67 bpm   Resting BP  112/68   Resting Oxygen Saturation   99 %   Exercise Oxygen  Saturation  during 6 min walk  100 %   Max Ex. HR  89 bpm   Max Ex. BP  120/60   2 Minute Post BP  104/60          Oxygen Initial Assessment:   Oxygen Re-Evaluation:   Oxygen Discharge (Final Oxygen Re-Evaluation):   Initial Exercise Prescription: Initial Exercise Prescription - 10/11/17 1200    Date of Initial Exercise RX and Referring Provider          Date  10/11/17    Referring Provider  Armanda Magic MD    Expected Discharge Date  01/13/18        Bike          Level  0.8    Minutes  10    METs  2.89        NuStep          Level  3    SPM  80    Minutes  10    METs  2        Track          Laps  11    Minutes  10    METs  2.92        Prescription Details          Frequency (times per week)  3    Duration  Progress to 30 minutes of continuous aerobic without signs/symptoms of physical distress        Intensity          THRR 40-80% of Max Heartrate  66-132    Ratings of Perceived Exertion  11-13    Perceived Dyspnea  0-4        Progression          Progression  Continue to progress workloads to maintain intensity without signs/symptoms of physical distress.        Resistance Training          Training Prescription  Yes    Weight  3lbs    Reps  10-15           Perform Capillary Blood Glucose checks as needed.  Exercise Prescription Changes: Exercise Prescription Changes    Response to Exercise    Row Name 10/15/17 1627 11/02/17 1100   Blood Pressure (Admit)  122/60  116/70   Blood Pressure (Exercise)  162/82  128/68   Blood Pressure (Exit)  102/60  100/62   Heart Rate (Admit)  81 bpm  62 bpm   Heart Rate (Exercise)  116 bpm  100 bpm   Heart Rate (Exit)  73 bpm  61 bpm   Rating of Perceived Exertion (Exercise)  11  12   Symptoms  none   none    Comments  pt oriented to exercise equipment today  no documentation   Duration  Continue with 30 min of aerobic exercise without signs/symptoms of physical distress.  Continue with 30  min of aerobic exercise without signs/symptoms of physical distress.   Intensity  THRR unchanged  THRR unchanged       Progression    Row Name 10/15/17 1627 11/02/17 1100   Progression  Continue to progress workloads to maintain intensity without signs/symptoms of physical distress.  Continue to progress workloads to maintain intensity without signs/symptoms of physical distress.   Average METs  2.9  4.3       Resistance Training    Row Name 10/15/17 1627 11/02/17 1100   Training Prescription  Yes  Yes   Weight  3lbs  5lbs   Reps  10-15  10-15   Time  10 Minutes  10 Minutes       Bike    Row Name 10/15/17 1627 11/02/17 1100   Level  0.8  1.8   Minutes  10  10   METs  2.88  5.35       NuStep    Row Name 10/15/17 1627 11/02/17 1100   Level  3  4   SPM  80  90   Minutes  10  10   METs  2.1  3.8       Track    Row Name 10/15/17 1627 11/02/17 1100   Laps  16  16   Minutes  10  10   METs  3.79  3.79       Home Exercise Plan    Row Name 10/15/17 1627 11/02/17 1100   Plans to continue exercise at  no documentation  Home (comment) walking   Frequency  no documentation  Add 3 additional days to program exercise sessions.   Initial Home Exercises Provided  no documentation  10/26/17          Exercise Comments: Exercise Comments    Row Name 10/15/17 1628 11/02/17 1112   Exercise Comments  Pt was oriented to exercise equipment today and was able to exercise for 30 minutes without difficulty.   Reviewed METs and goals. Pt is tolerating exercise program very well; rehab staff will continue to monitor activity levels and progress as tolerated.       Exercise Goals and Review: Exercise Goals    Exercise Goals    Row Name 10/11/17 0918 10/11/17 1240   Increase Physical Activity  Yes  no documentation   Intervention  Provide advice, education, support and counseling about physical activity/exercise needs.;Develop an individualized exercise prescription for aerobic and  resistive training based on initial evaluation findings, risk stratification, comorbidities and participant's personal goals.  no documentation   Expected Outcomes  Short Term: Attend rehab on a regular basis to increase amount of physical activity.;Long Term: Add in home exercise to make exercise part of routine and to increase amount of physical activity.;Long Term: Exercising regularly at least 3-5 days a week.  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  no documentation   Expected Outcomes  Long Term: Improve cardiorespiratory fitness, muscular endurance and strength as measured by increased METs and functional capacity ( );Short Term: Perform resistance training exercises routinely during rehab and add in resistance training at home;Short Term: Increase workloads from initial exercise prescription for resistance, speed, and METs.  no documentation   Able to understand and use rate of perceived exertion (RPE) scale  Yes  no documentation   Intervention  Provide education and explanation on how to use RPE scale  no documentation   Expected Outcomes  Short Term: Able to use RPE daily in rehab to express subjective intensity level;Long Term:  Able to use RPE to guide intensity level when exercising independently  no documentation   Knowledge and understanding of Target Heart Rate Range (THRR)  Yes  no documentation   Intervention  Provide education and explanation of THRR including how the numbers were predicted and where they are located for reference  no documentation   Expected Outcomes  Short Term:  Able to state/look up THRR;Long Term: Able to use THRR to govern intensity when exercising independently;Short Term: Able to use daily as guideline for intensity in rehab  no documentation   Able to check pulse independently  Yes  no documentation   Intervention  Provide education and demonstration on how to check pulse in carotid and radial arteries.;Review the importance of being able to check your own pulse for safety during independent exercise  no documentation   Expected Outcomes  Short Term: Able to explain why pulse checking is important during independent exercise;Long Term: Able to check pulse independently and accurately  no documentation   Understanding of Exercise Prescription  Yes  no documentation   Intervention  Provide education, explanation, and written materials on patient's individual exercise prescription  no documentation   Expected Outcomes  Long Term: Able to explain home exercise prescription to exercise independently;Short Term: Able to explain program exercise prescription  no documentation          Exercise Goals Re-Evaluation : Exercise Goals Re-Evaluation    Exercise Goal Re-Evaluation    Row Name 10/26/17 1200   Exercise Goals Review  Increase Physical Activity;Able to understand and use rate of perceived exertion (RPE) scale;Knowledge and understanding of Target Heart Rate Range (THRR);Increase Strength and Stamina;Able to check pulse independently;Improve claudication pain tolerance and improve walking ability;Understanding of Exercise Prescription   Comments  Reviewed HEP in which pt plans to walk for exercise for 30 minutes, 3x/week . Pt is also progressing well in cardiac rehab and handles WL increases very well.   Expected Outcomes  Pt will continue to improve in cardiorespiratory fitness by coming to cardiac rehab and being compliant with HEP.           Discharge Exercise Prescription (Final Exercise Prescription Changes): Exercise Prescription Changes  - 11/02/17 1100    Response to Exercise          Blood Pressure (Admit)  116/70    Blood Pressure (Exercise)  128/68    Blood Pressure (Exit)  100/62    Heart Rate (Admit)  62 bpm    Heart Rate (Exercise)  100 bpm    Heart Rate (Exit)  61 bpm  Rating of Perceived Exertion (Exercise)  12    Symptoms  none     Duration  Continue with 30 min of aerobic exercise without signs/symptoms of physical distress.    Intensity  THRR unchanged        Progression          Progression  Continue to progress workloads to maintain intensity without signs/symptoms of physical distress.    Average METs  4.3        Resistance Training          Training Prescription  Yes    Weight  5lbs    Reps  10-15    Time  10 Minutes        Bike          Level  1.8    Minutes  10    METs  5.35        NuStep          Level  4    SPM  90    Minutes  10    METs  3.8        Track          Laps  16    Minutes  10    METs  3.79        Home Exercise Plan          Plans to continue exercise at  Home (comment) walking    Frequency  Add 3 additional days to program exercise sessions.    Initial Home Exercises Provided  10/26/17           Nutrition:  Target Goals: Understanding of nutrition guidelines, daily intake of sodium 1500mg , cholesterol 200mg , calories 30% from fat and 7% or less from saturated fats, daily to have 5 or more servings of fruits and vegetables.  Biometrics: Pre Biometrics - 10/11/17 1230    Pre Biometrics          Height  5\' 7"  (1.702 m)    Weight  177 lb 0.5 oz (80.3 kg)    Waist Circumference  39.5 inches    Hip Circumference  39.5 inches    Waist to Hip Ratio  1 %    BMI (Calculated)  27.72    Triceps Skinfold  25 mm    % Body Fat  29.1 %    Grip Strength  45 kg    Flexibility  8 in    Single Leg Stand  30 seconds            Nutrition Therapy Plan and Nutrition Goals: Nutrition Therapy & Goals - 10/11/17 1341    Nutrition Therapy          Diet   consistent carbohydrate heart healthy        Personal Nutrition Goals          Nutrition Goal  Pt to identify food quantities necessary to achieve weight loss of 6-24 lbs.    Personal Goal #2  Pt to eat more and a variety of non-starchy vegetables        Intervention Plan          Intervention  Prescribe, educate and counsel regarding individualized specific dietary modifications aiming towards targeted core components such as weight, hypertension, lipid management, diabetes, heart failure and other comorbidities.    Expected Outcomes  Short Term Goal: Understand basic principles of dietary content, such as calories, fat, sodium, cholesterol and nutrients.  Nutrition Assessments: Nutrition Assessments - 10/11/17 1342    MEDFICTS Scores          Pre Score  20           Nutrition Goals Re-Evaluation: Nutrition Goals Re-Evaluation    Goals    Row Name 10/11/17 1341   Current Weight  177 lb 0.5 oz (80.3 kg)          Nutrition Goals Re-Evaluation: Nutrition Goals Re-Evaluation    Goals    Row Name 10/11/17 1341   Current Weight  177 lb 0.5 oz (80.3 kg)          Nutrition Goals Discharge (Final Nutrition Goals Re-Evaluation): Nutrition Goals Re-Evaluation - 10/11/17 1341    Goals          Current Weight  177 lb 0.5 oz (80.3 kg)           Psychosocial: Target Goals: Acknowledge presence or absence of significant depression and/or stress, maximize coping skills, provide positive support system. Participant is able to verbalize types and ability to use techniques and skills needed for reducing stress and depression.  Initial Review & Psychosocial Screening: Initial Psych Review & Screening - 10/11/17 1149    Initial Review          Current issues with  None Identified        Family Dynamics          Good Support System?  No        Barriers          Psychosocial barriers to participate in program  There are no identifiable barriers or  psychosocial needs.        Screening Interventions          Interventions  Encouraged to exercise           Quality of Life Scores: Quality of Life - 10/11/17 1157    Quality of Life          Select  Quality of Life        Quality of Life Scores          Health/Function Pre  29.2 %    Socioeconomic Pre  18.75 %    Psych/Spiritual Pre  30 %    Family Pre  30 %    GLOBAL Pre  27.09 %          Scores of 19 and below usually indicate a poorer quality of life in these areas.  A difference of  2-3 points is a clinically meaningful difference.  A difference of 2-3 points in the total score of the Quality of Life Index has been associated with significant improvement in overall quality of life, self-image, physical symptoms, and general health in studies assessing change in quality of life.  PHQ-9: Recent Review Flowsheet Data    Depression screen [facility] 2/9 10/15/2017   Decreased Interest 0   Down, Depressed, Hopeless 0   PHQ - 2 Score 0     Interpretation of Total Score  Total Score Depression Severity:  1-4 = Minimal depression, 5-9 = Mild depression, 10-14 = Moderate depression, 15-19 = Moderately severe depression, 20-27 = Severe depression   Psychosocial Evaluation and Intervention: Psychosocial Evaluation - 10/15/17 1046    Psychosocial Evaluation & Interventions          Interventions  Encouraged to exercise with the program and follow exercise prescription    Comments  No psychosocial needs identified. Oluwaseun enjoys playing volleyball.  Expected Outcomes  Rahul will exhibit a positive outlook utilizing good coping skills.    Continue Psychosocial Services   No Follow up required           Psychosocial Re-Evaluation: Psychosocial Re-Evaluation    Psychosocial Re-Evaluation    Row Name 11/02/17 1224   Current issues with  None Identified   Comments  no psychosocial needs identified, no interventions necessary    Expected Outcomes  pt will exhibit postive  outlook with good coping skills.    Interventions  Encouraged to attend Cardiac Rehabilitation for the exercise          Psychosocial Discharge (Final Psychosocial Re-Evaluation): Psychosocial Re-Evaluation - 11/02/17 1224    Psychosocial Re-Evaluation          Current issues with  None Identified    Comments  no psychosocial needs identified, no interventions necessary     Expected Outcomes  pt will exhibit postive outlook with good coping skills.     Interventions  Encouraged to attend Cardiac Rehabilitation for the exercise           Vocational Rehabilitation: Provide vocational rehab assistance to qualifying candidates.   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 Participant will state understanding/return demonstration of topics presented.  Learning Barriers/Preferences: Learning Barriers/Preferences - 10/11/17 0917    Learning Barriers/Preferences          Learning Barriers  None    Learning Preferences  Skilled Demonstration           Education Topics: Count Your Pulse:  -Group instruction provided by verbal instruction, demonstration, patient participation and written materials to support subject.  Instructors address importance of being able to find your pulse and how to count your pulse when at home without a heart monitor.  Patients get hands on experience counting their pulse with staff help and individually.   Heart Attack, Angina, and Risk Factor Modification:  -Group instruction provided by verbal instruction, video, and written materials to support subject.  Instructors address signs and symptoms of angina and heart attacks.     Also discuss risk factors for heart disease and how to make changes to improve heart health risk factors. Flowsheet Row CARDIAC REHAB PHASE II EXERCISE from 11/07/2017 in [facility] CARDIAC REHAB  Date  11/07/17  Educator  RN  Instruction Review Code  2- Demonstrated Understanding      Functional Fitness:  -Group instruction provided by verbal instruction, demonstration, patient participation, and written materials to support subject.  Instructors address safety measures for doing things around the house.  Discuss how to get up and down off the floor, how to pick things up properly, how to safely get out of a chair without assistance, and balance training.   Meditation and Mindfulness:  -Group instruction provided by verbal instruction, patient participation, and written materials to support subject.  Instructor addresses importance of mindfulness and meditation practice to help reduce stress and improve awareness.  Instructor also leads participants through a meditation exercise.    Stretching for Flexibility and Mobility:  -Group instruction provided by verbal instruction, patient participation, and written materials to support  subject.  Instructors lead participants through series of stretches that are designed to increase flexibility thus improving mobility.  These stretches are additional exercise for major muscle groups that are typically performed during regular warm up and cool down.   Hands Only CPR:  -Group verbal, video, and participation provides a basic overview of AHA guidelines for community CPR. Role-play of emergencies allow participants the opportunity to practice calling for help and chest compression technique with discussion of AED use.   Hypertension: -Group verbal and written instruction that provides a basic overview of hypertension including the most recent diagnostic guidelines, risk factor reduction with self-care instructions and medication  management.    Nutrition I class: Heart Healthy Eating:  -Group instruction provided by PowerPoint slides, verbal discussion, and written materials to support subject matter. The instructor gives an explanation and review of the Therapeutic Lifestyle Changes diet recommendations, which includes a discussion on lipid goals, dietary fat, sodium, fiber, plant stanol/sterol esters, sugar, and the components of a well-balanced, healthy diet.   Nutrition II class: Lifestyle Skills:  -Group instruction provided by PowerPoint slides, verbal discussion, and written materials to support subject matter. The instructor gives an explanation and review of label reading, grocery shopping for heart health, heart healthy recipe modifications, and ways to make healthier choices when eating out.   Diabetes Question & Answer:  -Group instruction provided by PowerPoint slides, verbal discussion, and written materials to support subject matter. The instructor gives an explanation and review of diabetes co-morbidities, pre- and post-prandial blood glucose goals, pre-exercise blood glucose goals, signs, symptoms, and treatment of hypoglycemia and hyperglycemia, and foot care basics.   Diabetes Blitz:  -Group instruction provided by PowerPoint slides, verbal discussion, and written materials to support subject matter. The instructor gives an explanation and review of the physiology behind type 1 and type 2 diabetes, diabetes medications and rational behind using different medications, pre- and post-prandial blood glucose recommendations and Hemoglobin A1c goals, diabetes diet, and exercise including blood glucose guidelines for exercising safely.    Portion Distortion:  -Group instruction provided by PowerPoint slides, verbal discussion, written materials, and food models to support subject matter. The instructor gives an explanation of serving size versus portion size, changes in portions sizes over the last 20 years,  and what consists of a serving from each food group.   Stress Management:  -Group instruction provided by verbal instruction, video, and written materials to support subject matter.  Instructors review role of stress in heart disease and how to cope with stress positively.     Exercising on Your Own:  -Group instruction provided by verbal instruction, power point, and written materials to support subject.  Instructors discuss benefits of exercise, components of exercise, frequency and intensity of exercise, and end points for exercise.  Also discuss use of nitroglycerin and activating EMS.  Review options of places to exercise outside of rehab.  Review guidelines for sex with heart disease.   Cardiac Drugs I:  -Group instruction provided by verbal instruction and written materials to support subject.  Instructor reviews cardiac drug classes: antiplatelets, anticoagulants, beta blockers, and statins.  Instructor discusses reasons, side effects, and lifestyle considerations for each drug class.   Cardiac Drugs II:  -Group instruction provided by verbal instruction and written materials to support subject.  Instructor reviews cardiac drug classes: angiotensin converting enzyme inhibitors (ACE-I), angiotensin II receptor blockers (ARBs), nitrates, and calcium channel blockers.  Instructor discusses reasons, side effects, and lifestyle considerations for each drug class.  Anatomy and Physiology of the Circulatory System:  Group verbal and written instruction and models provide basic cardiac anatomy and physiology, with the coronary electrical and arterial systems. Review of: AMI, Angina, Valve disease, Heart Failure, Peripheral Artery Disease, Cardiac Arrhythmia, Pacemakers, and the ICD. Flowsheet Row CARDIAC REHAB PHASE II EXERCISE from 11/07/2017 in [facility] CARDIAC REHAB  Date  10/31/17  Educator  RN  Instruction Review Code  2- Demonstrated Understanding      Other  Education:  -Group or individual verbal, written, or video instructions that support the educational goals of the cardiac rehab program.   Holiday Eating Survival Tips:  -Group instruction provided by PowerPoint slides, verbal discussion, and written materials to support subject matter. The instructor gives patients tips, tricks, and techniques to help them not only survive but enjoy the holidays despite the onslaught of food that accompanies the holidays.   Knowledge Questionnaire Score: Knowledge Questionnaire Score - 10/11/17 1147    Knowledge Questionnaire Score          Pre Score  22/28           Core Components/Risk Factors/Patient Goals at Admission: Personal Goals and Risk Factors at Admission - 10/11/17 1240    Core Components/Risk Factors/Patient Goals on Admission           Weight Management  Yes;Weight Maintenance;Weight Loss    Intervention  Weight Management: Develop a combined nutrition and exercise program designed to reach desired caloric intake, while maintaining appropriate intake of nutrient and fiber, sodium and fats, and appropriate energy expenditure required for the weight goal.;Weight Management: Provide education and appropriate resources to help participant work on and attain dietary goals.    Admit Weight  177 lb 0.5 oz (80.3 kg)    Goal Weight: Short Term  170 lb (77.1 kg)    Goal Weight: Long Term  165 lb (74.8 kg)    Expected Outcomes  Short Term: Continue to assess and modify interventions until short term weight is achieved;Long Term: Adherence to nutrition and physical activity/exercise program aimed toward attainment of established weight goal;Weight Maintenance: Understanding of the daily nutrition guidelines, which includes 25-35% calories from fat, 7% or less cal from saturated fats, less than 200mg  cholesterol, less than 1.5gm of sodium, & 5 or more servings of fruits and vegetables daily;Weight Loss: Understanding of general recommendations for a  balanced deficit meal plan, which promotes 1-2 lb weight loss per week and includes a negative energy balance of [phone] kcal/d;Understanding recommendations for meals to include 15-35% energy as protein, 25-35% energy from fat, 35-60% energy from carbohydrates, less than 200mg  of dietary cholesterol, 20-35 gm of total fiber daily;Understanding of distribution of calorie intake throughout the day with the consumption of 4-5 meals/snacks    Hypertension  Yes    Intervention  Provide education on lifestyle modifcations including regular physical activity/exercise, weight management, moderate sodium restriction and increased consumption of fresh fruit, vegetables, and low fat dairy, alcohol moderation, and smoking cessation.;Monitor prescription use compliance.    Expected Outcomes  Short Term: Continued assessment and intervention until BP is < 140/29mm HG in hypertensive participants. < 130/33mm HG in hypertensive participants with diabetes, heart failure or chronic kidney disease.;Long Term: Maintenance of blood pressure at goal levels.    Lipids  Yes    Intervention  Provide education and support for participant on nutrition & aerobic/resistive exercise along with prescribed medications to achieve LDL 70mg , HDL >40mg .    Expected Outcomes  Short Term: Participant states  understanding of desired cholesterol values and is compliant with medications prescribed. Participant is following exercise prescription and nutrition guidelines.;Long Term: Cholesterol controlled with medications as prescribed, with individualized exercise RX and with personalized nutrition plan. Value goals: LDL < 70mg , HDL > 40 mg.           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 Keaundre would like to increase his stamina to go back to work.  Kohan has multiple CAD RFs and is willing to participate in exercise. Sukhraj would like to increase his stamina to go back to work.   Expected Outcomes  Pt will participate in CR exercise, nutrition, and lifestyle modification opportunities.   Pt will participate in CR exercise, nutrition, and lifestyle modification opportunities.           Core Components/Risk Factors/Patient Goals at Discharge (Final Review):  Goals and Risk Factor Review - 11/02/17 1224    Core Components/Risk Factors/Patient Goals Review          Personal Goals Review  Weight Management/Obesity;Lipids;Hypertension;Diabetes    Review  Sargon has multiple CAD RFs and is willing to participate in exercise. Deaundra would like to increase his stamina to go back to work.    Expected Outcomes  Pt will participate in CR exercise, nutrition, and lifestyle modification opportunities.            ITP Comments: ITP Comments    Row Name 10/11/17 0913 10/15/17 1044 11/02/17 1222   ITP Comments  Dr. [name], Medical Director  Phong started exercise today.  He tolerated it well.   30 day ITP reveiw.  pt with good attendance and participation. P pt feels comfortable communicating with staff and peers without interpreter services. t interpreter will come PRN at pt request.       Comments:

## 2017-11-09 ENCOUNTER — Encounter (HOSPITAL_COMMUNITY): Payer: BLUE CROSS/BLUE SHIELD

## 2017-11-09 ENCOUNTER — Encounter (HOSPITAL_COMMUNITY)
Admission: RE | Admit: 2017-11-09 | Discharge: 2017-11-09 | Disposition: A | Discharge status: Home / Self Care | Payer: BLUE CROSS/BLUE SHIELD | Source: Ambulatory Visit | Attending: Cardiology | Admitting: Cardiology

## 2017-11-09 DIAGNOSIS — Z951 Presence of aortocoronary bypass graft: Secondary | ICD-10-CM | POA: Diagnosis not present

## 2017-11-09 DIAGNOSIS — I214 Non-ST elevation (NSTEMI) myocardial infarction: Secondary | ICD-10-CM

## 2017-11-12 ENCOUNTER — Encounter (HOSPITAL_COMMUNITY): Payer: BLUE CROSS/BLUE SHIELD

## 2017-11-12 ENCOUNTER — Encounter (HOSPITAL_COMMUNITY)
Admission: RE | Admit: 2017-11-12 | Discharge: 2017-11-12 | Disposition: A | Discharge status: Home / Self Care | Payer: BLUE CROSS/BLUE SHIELD | Source: Ambulatory Visit | Attending: Cardiology | Admitting: Cardiology

## 2017-11-12 DIAGNOSIS — I214 Non-ST elevation (NSTEMI) myocardial infarction: Secondary | ICD-10-CM

## 2017-11-12 DIAGNOSIS — Z951 Presence of aortocoronary bypass graft: Secondary | ICD-10-CM

## 2017-11-14 ENCOUNTER — Encounter (HOSPITAL_COMMUNITY): Payer: BLUE CROSS/BLUE SHIELD

## 2017-11-14 ENCOUNTER — Encounter (HOSPITAL_COMMUNITY)
Admission: RE | Admit: 2017-11-14 | Discharge: 2017-11-14 | Disposition: A | Discharge status: Home / Self Care | Payer: BLUE CROSS/BLUE SHIELD | Source: Ambulatory Visit | Attending: Cardiology | Admitting: Cardiology

## 2017-11-14 DIAGNOSIS — Z951 Presence of aortocoronary bypass graft: Secondary | ICD-10-CM | POA: Diagnosis not present

## 2017-11-14 DIAGNOSIS — I214 Non-ST elevation (NSTEMI) myocardial infarction: Secondary | ICD-10-CM

## 2017-11-16 ENCOUNTER — Encounter (HOSPITAL_COMMUNITY)
Admission: RE | Admit: 2017-11-16 | Discharge: 2017-11-16 | Disposition: A | Discharge status: Home / Self Care | Payer: BLUE CROSS/BLUE SHIELD | Source: Ambulatory Visit | Attending: Cardiology | Admitting: Cardiology

## 2017-11-16 ENCOUNTER — Encounter (HOSPITAL_COMMUNITY): Payer: BLUE CROSS/BLUE SHIELD

## 2017-11-16 DIAGNOSIS — Z951 Presence of aortocoronary bypass graft: Secondary | ICD-10-CM | POA: Diagnosis not present

## 2017-11-16 DIAGNOSIS — I214 Non-ST elevation (NSTEMI) myocardial infarction: Secondary | ICD-10-CM

## 2017-11-19 ENCOUNTER — Encounter (HOSPITAL_COMMUNITY): Payer: BLUE CROSS/BLUE SHIELD

## 2017-11-19 ENCOUNTER — Encounter (HOSPITAL_COMMUNITY)
Admission: RE | Admit: 2017-11-19 | Discharge: 2017-11-19 | Disposition: A | Discharge status: Home / Self Care | Payer: BLUE CROSS/BLUE SHIELD | Source: Ambulatory Visit | Attending: Cardiology | Admitting: Cardiology

## 2017-11-19 DIAGNOSIS — Z951 Presence of aortocoronary bypass graft: Secondary | ICD-10-CM | POA: Diagnosis not present

## 2017-11-19 DIAGNOSIS — I214 Non-ST elevation (NSTEMI) myocardial infarction: Secondary | ICD-10-CM

## 2017-11-21 ENCOUNTER — Encounter (HOSPITAL_COMMUNITY)
Admission: RE | Admit: 2017-11-21 | Discharge: 2017-11-21 | Disposition: A | Discharge status: Home / Self Care | Payer: BLUE CROSS/BLUE SHIELD | Source: Ambulatory Visit | Attending: Cardiology | Admitting: Cardiology

## 2017-11-21 ENCOUNTER — Encounter (HOSPITAL_COMMUNITY): Payer: BLUE CROSS/BLUE SHIELD

## 2017-11-21 DIAGNOSIS — Z951 Presence of aortocoronary bypass graft: Secondary | ICD-10-CM

## 2017-11-21 DIAGNOSIS — I214 Non-ST elevation (NSTEMI) myocardial infarction: Secondary | ICD-10-CM

## 2017-11-23 ENCOUNTER — Telehealth: Payer: Self-pay | Admitting: Cardiology

## 2017-11-23 ENCOUNTER — Encounter (HOSPITAL_COMMUNITY): Payer: BLUE CROSS/BLUE SHIELD

## 2017-11-23 ENCOUNTER — Encounter (HOSPITAL_COMMUNITY)
Admission: RE | Admit: 2017-11-23 | Discharge: 2017-11-23 | Disposition: A | Discharge status: Home / Self Care | Payer: BLUE CROSS/BLUE SHIELD | Source: Ambulatory Visit | Attending: Cardiology | Admitting: Cardiology

## 2017-11-23 DIAGNOSIS — Z951 Presence of aortocoronary bypass graft: Secondary | ICD-10-CM

## 2017-11-23 DIAGNOSIS — I214 Non-ST elevation (NSTEMI) myocardial infarction: Secondary | ICD-10-CM

## 2017-11-23 NOTE — Telephone Encounter (Signed)
°*  STAT* If patient is at the pharmacy, call can be transferred to refill team.   1. Which medications need to be refilled? (please list name of each medication and dose if known) METOPROLOL  50mg   2. Which pharmacy/location (including street and city if local pharmacy) is medication to be sent to? [facility]   3. Do they need a 30 day or 90 day supply? Till his appt 02/04/18

## 2017-11-23 NOTE — Telephone Encounter (Signed)
Spoke with pharmacist at [facility] and they have the rx for the patient as below. They will get this ready for the patient and let him know.  Outpatient Medication Detail    Disp Refills Start End   metoprolol tartrate (LOPRESSOR) 50 MG tablet 180 tablet 3 09/20/2017    Sig - Route: Take 1 tablet (50 mg total) by mouth 2 (two) times daily. - Oral   Sent to pharmacy as: metoprolol tartrate (LOPRESSOR) 50 MG tablet   E-Prescribing Status: Receipt confirmed by pharmacy (09/20/2017 2:32 PM EDT)   Pharmacy   [facility] - [place], [place] - [address]

## 2017-11-26 ENCOUNTER — Encounter (HOSPITAL_COMMUNITY)
Admission: RE | Admit: 2017-11-26 | Discharge: 2017-11-26 | Disposition: A | Discharge status: Home / Self Care | Payer: BLUE CROSS/BLUE SHIELD | Source: Ambulatory Visit | Attending: Cardiology | Admitting: Cardiology

## 2017-11-26 ENCOUNTER — Encounter (HOSPITAL_COMMUNITY): Payer: BLUE CROSS/BLUE SHIELD

## 2017-11-26 DIAGNOSIS — I214 Non-ST elevation (NSTEMI) myocardial infarction: Secondary | ICD-10-CM

## 2017-11-26 DIAGNOSIS — Z951 Presence of aortocoronary bypass graft: Secondary | ICD-10-CM | POA: Diagnosis not present

## 2017-11-28 ENCOUNTER — Encounter (HOSPITAL_COMMUNITY): Payer: BLUE CROSS/BLUE SHIELD

## 2017-11-28 ENCOUNTER — Encounter (HOSPITAL_COMMUNITY)
Admission: RE | Admit: 2017-11-28 | Discharge: 2017-11-28 | Disposition: A | Discharge status: Home / Self Care | Payer: BLUE CROSS/BLUE SHIELD | Source: Ambulatory Visit | Attending: Cardiology | Admitting: Cardiology

## 2017-11-28 DIAGNOSIS — I214 Non-ST elevation (NSTEMI) myocardial infarction: Secondary | ICD-10-CM

## 2017-11-28 DIAGNOSIS — Z951 Presence of aortocoronary bypass graft: Secondary | ICD-10-CM

## 2017-11-30 ENCOUNTER — Encounter (HOSPITAL_COMMUNITY): Payer: BLUE CROSS/BLUE SHIELD

## 2017-11-30 ENCOUNTER — Encounter (HOSPITAL_COMMUNITY)
Admission: RE | Admit: 2017-11-30 | Discharge: 2017-11-30 | Disposition: A | Discharge status: Home / Self Care | Payer: BLUE CROSS/BLUE SHIELD | Source: Ambulatory Visit | Attending: Cardiology | Admitting: Cardiology

## 2017-11-30 DIAGNOSIS — Z951 Presence of aortocoronary bypass graft: Secondary | ICD-10-CM

## 2017-11-30 DIAGNOSIS — I214 Non-ST elevation (NSTEMI) myocardial infarction: Secondary | ICD-10-CM

## 2017-12-05 ENCOUNTER — Encounter (HOSPITAL_COMMUNITY): Payer: BLUE CROSS/BLUE SHIELD

## 2017-12-05 ENCOUNTER — Encounter (HOSPITAL_COMMUNITY)
Admission: RE | Admit: 2017-12-05 | Discharge: 2017-12-05 | Disposition: A | Discharge status: Home / Self Care | Payer: BLUE CROSS/BLUE SHIELD | Source: Ambulatory Visit | Attending: Cardiology | Admitting: Cardiology

## 2017-12-05 DIAGNOSIS — Z951 Presence of aortocoronary bypass graft: Secondary | ICD-10-CM

## 2017-12-05 DIAGNOSIS — E119 Type 2 diabetes mellitus without complications: Secondary | ICD-10-CM | POA: Diagnosis not present

## 2017-12-05 DIAGNOSIS — I10 Essential (primary) hypertension: Secondary | ICD-10-CM | POA: Diagnosis not present

## 2017-12-05 DIAGNOSIS — E785 Hyperlipidemia, unspecified: Secondary | ICD-10-CM | POA: Diagnosis not present

## 2017-12-05 DIAGNOSIS — I214 Non-ST elevation (NSTEMI) myocardial infarction: Secondary | ICD-10-CM | POA: Insufficient documentation

## 2017-12-05 DIAGNOSIS — I2510 Atherosclerotic heart disease of native coronary artery without angina pectoris: Secondary | ICD-10-CM | POA: Insufficient documentation

## 2017-12-06 ENCOUNTER — Encounter (HOSPITAL_COMMUNITY): Payer: Self-pay

## 2017-12-06 NOTE — Progress Notes (Signed)
Cardiac Individual Treatment Plan  Patient Details  Name: [name] MRN: [id number] Date of Birth: [birth year] Referring Provider:   Flowsheet Row CARDIAC REHAB PHASE II ORIENTATION from 10/11/2017 in [facility] CARDIAC REHAB  Referring Provider  Armanda Magic MD      Initial Encounter Date:  Flowsheet Row CARDIAC REHAB PHASE II ORIENTATION from 10/11/2017 in [facility] CARDIAC REHAB  Date  10/11/17      Visit Diagnosis: NSTEMI (non-ST elevated myocardial infarction) (HCC)  S/P CABG x 5  Patient's Home Medications on Admission:  Current Outpatient Medications:  .  acetaminophen (TYLENOL) 500 MG tablet, Take 1 tablet (500 mg total) by mouth every 6 (six) hours as needed., Disp: 30 tablet, Rfl: 0 .  aspirin EC 81 MG EC tablet, Take 1 tablet (81 mg total) by mouth daily., Disp: , Rfl:  .  atorvastatin (LIPITOR) 80 MG tablet, Take 1 tablet (80 mg total) by mouth daily at 6 PM., Disp: 90 tablet, Rfl: 3 .  lidocaine (XYLOCAINE) 2 % solution, Use as directed 20 mLs in the mouth or throat as needed for mouth pain., Disp: 100 mL, Rfl: 0 .  losartan-hydrochlorothiazide (HYZAAR) 100-25 MG tablet, Take 1 tablet by mouth daily., Disp: 90 tablet, Rfl: 3 .  metFORMIN (GLUCOPHAGE) 1000 MG tablet, Take 1,000 mg by mouth 2 (two) times daily., Disp: , Rfl: 2 .  metoprolol tartrate (LOPRESSOR) 50 MG tablet, Take 1 tablet (50 mg total) by mouth 2 (two) times daily., Disp: 180 tablet, Rfl: 3 .  ondansetron (ZOFRAN) 4 MG tablet, Take 1 tablet (4 mg total) by mouth every 8 (eight) hours as needed for nausea or vomiting., Disp: 20 tablet, Rfl: 0  Past Medical History: Past Medical History:  Diagnosis Date  . CAD (coronary artery disease), native coronary artery 09/20/2017  . Diabetes mellitus without complication (HCC)   . Hyperlipidemia   . Hypertension     Tobacco Use: Social History   Tobacco Use  Smoking Status Former Smoker  . Last attempt to quit:  07/22/1998  . Years since quitting: 19.3  Smokeless Tobacco Never Used    Labs: Recent Review Flowsheet Data    Labs for ITP Cardiac and Pulmonary Rehab Latest Ref Rng & Units 07/23/2017 07/23/2017 07/23/2017 07/24/2017 07/24/2017   Cholestrol 0 - 200 mg/dL - - - - -   LDLCALC 0 - 99 mg/dL - - - - -   HDL >40 mg/dL - - - - -   Trlycerides <150 mg/dL - - - - -   Hemoglobin A1c 4.8 - 5.6 % - - - - -   PHART 7.350 - 7.450 7.372 - 7.345(L) - -   PCO2ART 32.0 - 48.0 mmHg 34.0 - 32.2 - -   HCO3 20.0 - 28.0 mmol/L 19.8(L) - 17.5(L) - -   TCO2 22 - 32 mmol/L 21(L) 21(L) 19(L) 13(L) 23   ACIDBASEDEF 0.0 - 2.0 mmol/L 5.0(H) - 7.0(H) - -   O2SAT % 99.0 - 91.0 - -      Capillary Blood Glucose: Lab Results  Component Value Date   GLUCAP 109 (H) 10/17/2017   GLUCAP 179 (H) 10/17/2017   GLUCAP 109 (H) 10/15/2017   GLUCAP 135 (H) 10/15/2017   GLUCAP 114 (H) 07/30/2017     Exercise Target Goals: Exercise Program Goal: Individual exercise prescription set using results from initial 6 min walk test and THRR while considering  patient's activity barriers and safety.   Exercise Prescription Goal: Initial exercise  prescription builds to 30-45 minutes a day of aerobic activity, 2-3 days per week.  Home exercise guidelines will be given to patient during program as part of exercise prescription that the participant will acknowledge.  Activity Barriers & Risk Stratification: Activity Barriers & Cardiac Risk Stratification - 10/11/17 0917    Activity Barriers & Cardiac Risk Stratification          Activity Barriers  None    Cardiac Risk Stratification  High           6 Minute Walk: 6 Minute Walk    6 Minute Walk    Row Name 10/11/17 1239   Phase  Initial   Distance  1647 feet   Walk Time  6 minutes   # of Rest Breaks  0   MPH  3.1   METS  4.1   RPE  11   VO2 Peak  14.5   Symptoms  No   Resting HR  67 bpm   Resting BP  112/68   Resting Oxygen Saturation   99 %   Exercise Oxygen  Saturation  during 6 min walk  100 %   Max Ex. HR  89 bpm   Max Ex. BP  120/60   2 Minute Post BP  104/60          Oxygen Initial Assessment:   Oxygen Re-Evaluation:   Oxygen Discharge (Final Oxygen Re-Evaluation):   Initial Exercise Prescription: Initial Exercise Prescription - 10/11/17 1200    Date of Initial Exercise RX and Referring Provider          Date  10/11/17    Referring Provider  Armanda Magic MD    Expected Discharge Date  01/13/18        Bike          Level  0.8    Minutes  10    METs  2.89        NuStep          Level  3    SPM  80    Minutes  10    METs  2        Track          Laps  11    Minutes  10    METs  2.92        Prescription Details          Frequency (times per week)  3    Duration  Progress to 30 minutes of continuous aerobic without signs/symptoms of physical distress        Intensity          THRR 40-80% of Max Heartrate  66-132    Ratings of Perceived Exertion  11-13    Perceived Dyspnea  0-4        Progression          Progression  Continue to progress workloads to maintain intensity without signs/symptoms of physical distress.        Resistance Training          Training Prescription  Yes    Weight  3lbs    Reps  10-15           Perform Capillary Blood Glucose checks as needed.  Exercise Prescription Changes: Exercise Prescription Changes    Response to Exercise    Row Name 10/15/17 1627 11/02/17 1100 11/14/17 1100 11/30/17 1333   Blood Pressure (Admit)  122/60  116/70  118/72  108/70  Blood Pressure (Exercise)  162/82  128/68  128/70  122/60   Blood Pressure (Exit)  102/60  100/62  102/68  118/60   Heart Rate (Admit)  81 bpm  62 bpm  66 bpm  66 bpm   Heart Rate (Exercise)  116 bpm  100 bpm  100 bpm  116 bpm   Heart Rate (Exit)  73 bpm  61 bpm  66 bpm  76 bpm   Rating of Perceived Exertion (Exercise)  11  12  12  12    Perceived Dyspnea (Exercise)  no documentation  no documentation  no  documentation  0   Symptoms  none   none   none   None    Comments  pt oriented to exercise equipment today  no documentation  no documentation  no documentation   Duration  Continue with 30 min of aerobic exercise without signs/symptoms of physical distress.  Continue with 30 min of aerobic exercise without signs/symptoms of physical distress.  Continue with 30 min of aerobic exercise without signs/symptoms of physical distress.  Continue with 30 min of aerobic exercise without signs/symptoms of physical distress.   Intensity  THRR unchanged  THRR unchanged  THRR unchanged  THRR unchanged       Progression    Row Name 10/15/17 1627 11/02/17 1100 11/14/17 1100 11/30/17 1333   Progression  Continue to progress workloads to maintain intensity without signs/symptoms of physical distress.  Continue to progress workloads to maintain intensity without signs/symptoms of physical distress.  Continue to progress workloads to maintain intensity without signs/symptoms of physical distress.  Continue to progress workloads to maintain intensity without signs/symptoms of physical distress.   Average METs  2.9  4.3  4.3  4.8       Resistance Training    Row Name 10/15/17 1627 11/02/17 1100 11/14/17 1100 11/30/17 1333   Training Prescription  Yes  Yes  No relaxation day  Yes   Weight  3lbs  5lbs  no documentation  6lbs   Reps  10-15  10-15  no documentation  10-15   Time  10 Minutes  10 Minutes  10 Minutes  10 Minutes       Interval Training    Row Name 10/15/17 1627 11/02/17 1100 11/14/17 1100 11/30/17 1333   Interval Training  no documentation  no documentation  no documentation  No       Bike    Row Name 10/15/17 1627 11/02/17 1100 11/14/17 1100 11/30/17 1333   Level  0.8  1.8  1.8  2   Minutes  10  10  10  10    METs  2.88  5.35  5.36  5.81       NuStep    Row Name 10/15/17 1627 11/02/17 1100 11/14/17 1100 11/30/17 1333   Level  3  4  4  4    SPM  80  90  100  100   Minutes  10  10  10  10     METs  2.1  3.8  4.7  4.5       Track    Row Name 10/15/17 1627 11/02/17 1100 11/14/17 1100 11/30/17 1333   Laps  16  16  11  18    Minutes  10  10  10  10    METs  3.79  3.79  2.92  4.14       Home Exercise Plan    Row Name 10/15/17 1627 11/02/17 1100 11/14/17 1100 11/30/17  1333   Plans to continue exercise at  no documentation  Home (comment) walking  Home (comment) walking  Home (comment) walking   Frequency  no documentation  Add 3 additional days to program exercise sessions.  Add 3 additional days to program exercise sessions.  Add 3 additional days to program exercise sessions.   Initial Home Exercises Provided  no documentation  10/26/17  10/26/17  10/26/17          Exercise Comments: Exercise Comments    Row Name 10/15/17 1628 11/02/17 1112 11/26/17 1659   Exercise Comments  Pt was oriented to exercise equipment today and was able to exercise for 30 minutes without difficulty.   Reviewed METs and goals. Pt is tolerating exercise program very well; rehab staff will continue to monitor activity levels and progress as tolerated.   Reviewed METs and goals. Pt is tolerating exercise program very well; rehab staff will continue to monitor activity levels and progress as tolerated.       Exercise Goals and Review: Exercise Goals    Exercise Goals    Row Name 10/11/17 0918 10/11/17 1240   Increase Physical Activity  Yes  no documentation   Intervention  Provide advice, education, support and counseling about physical activity/exercise needs.;Develop an individualized exercise prescription for aerobic and resistive training based on initial evaluation findings, risk stratification, comorbidities and participant's personal goals.  no documentation   Expected Outcomes  Short Term: Attend rehab on a regular basis to increase amount of physical activity.;Long Term: Add in home exercise to make exercise part of routine and to increase amount of physical activity.;Long Term: Exercising  regularly at least 3-5 days a week.  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  no documentation   Expected Outcomes  Long Term: Improve cardiorespiratory fitness, muscular endurance and strength as measured by increased METs and functional capacity ( );Short Term: Perform resistance training exercises routinely during rehab and add in resistance training at home;Short Term: Increase workloads from initial exercise prescription for resistance, speed, and METs.  no documentation   Able to understand and use rate of perceived exertion (RPE) scale  Yes  no documentation   Intervention  Provide education and explanation on how to use RPE scale  no documentation   Expected Outcomes  Short Term: Able to use RPE daily in rehab to express subjective intensity level;Long Term:  Able to use RPE to guide intensity level when exercising independently  no documentation   Knowledge and understanding of Target Heart Rate Range (THRR)  Yes  no documentation   Intervention  Provide education and explanation of THRR including how the numbers were predicted and where they are located for reference  no documentation   Expected Outcomes  Short Term: Able to state/look up THRR;Long Term: Able to use THRR to govern intensity when exercising independently;Short Term: Able to use daily as guideline for intensity in rehab  no documentation   Able to check pulse independently  Yes  no documentation   Intervention  Provide education and demonstration on how to check pulse in carotid and radial arteries.;Review the importance of being  able to check your own pulse for safety during  independent exercise  no documentation   Expected Outcomes  Short Term: Able to explain why pulse checking is important during independent exercise;Long Term: Able to check pulse independently and accurately  no documentation   Understanding of Exercise Prescription  Yes  no documentation   Intervention  Provide education, explanation, and written materials on patient's individual exercise prescription  no documentation   Expected Outcomes  Long Term: Able to explain home exercise prescription to exercise independently;Short Term: Able to explain program exercise prescription  no documentation          Exercise Goals Re-Evaluation : Exercise Goals Re-Evaluation    Exercise Goal Re-Evaluation    Row Name 10/26/17 1200 11/26/17 1658   Exercise Goals Review  Increase Physical Activity;Able to understand and use rate of perceived exertion (RPE) scale;Knowledge and understanding of Target Heart Rate Range (THRR);Increase Strength and Stamina;Able to check pulse independently;Improve claudication pain tolerance and improve walking ability;Understanding of Exercise Prescription  Increase Physical Activity;Able to understand and use rate of perceived exertion (RPE) scale;Knowledge and understanding of Target Heart Rate Range (THRR);Increase Strength and Stamina;Able to check pulse independently;Improve claudication pain tolerance and improve walking ability;Understanding of Exercise Prescription   Comments  Reviewed HEP in which pt plans to walk for exercise for 30 minutes, 3x/week . Pt is also progressing well in cardiac rehab and handles WL increases very well.  Pt is compliant with HEP in which he does walk for 30 minutes 3x/week. Pt is also progressing well in cardiac rehab in which he tolerates WL increases very well.    Expected Outcomes  Pt will continue to improve in cardiorespiratory fitness by coming to cardiac rehab and being compliant with HEP.  Pt  will continue to improve in cardiorespiratory fitness by coming to cardiac rehab and being compliant with HEP.          Discharge Exercise Prescription (Final Exercise Prescription Changes): Exercise Prescription Changes - 11/30/17 1333    Response to Exercise          Blood Pressure (Admit)  108/70    Blood Pressure (Exercise)  122/60    Blood Pressure (Exit)  118/60    Heart Rate (Admit)  66 bpm    Heart Rate (Exercise)  116 bpm    Heart Rate (Exit)  76 bpm    Rating of Perceived Exertion (Exercise)  12    Perceived Dyspnea (Exercise)  0    Symptoms  None     Duration  Continue with 30 min of aerobic exercise without signs/symptoms of physical distress.    Intensity  THRR unchanged        Progression          Progression  Continue to progress workloads to maintain intensity without signs/symptoms of physical distress.    Average METs  4.8        Resistance Training          Training Prescription  Yes    Weight  6lbs    Reps  10-15    Time  10 Minutes        Interval Training          Interval Training  No        Bike          Level  2    Minutes  10    METs  5.81        NuStep          Level  4  SPM  100    Minutes  10    METs  4.5        Track          Laps  18    Minutes  10    METs  4.14        Home Exercise Plan          Plans to continue exercise at  Home (comment)   walking   Frequency  Add 3 additional days to program exercise sessions.    Initial Home Exercises Provided  10/26/17           Nutrition:  Target Goals: Understanding of nutrition guidelines, daily intake of sodium 1500mg , cholesterol 200mg , calories 30% from fat and 7% or less from saturated fats, daily to have 5 or more servings of fruits and vegetables.  Biometrics: Pre Biometrics - 10/11/17 1230    Pre Biometrics          Height  5\' 7"  (1.702 m)    Weight  80.3 kg    Waist Circumference  39.5 inches    Hip Circumference  39.5 inches    Waist to Hip  Ratio  1 %    BMI (Calculated)  27.72    Triceps Skinfold  25 mm    % Body Fat  29.1 %    Grip Strength  45 kg    Flexibility  8 in    Single Leg Stand  30 seconds            Nutrition Therapy Plan and Nutrition Goals: Nutrition Therapy & Goals - 10/11/17 1341    Nutrition Therapy          Diet  consistent carbohydrate heart healthy        Personal Nutrition Goals          Nutrition Goal  Pt to identify food quantities necessary to achieve weight loss of 6-24 lbs.    Personal Goal #2  Pt to eat more and a variety of non-starchy vegetables        Intervention Plan          Intervention  Prescribe, educate and counsel regarding individualized specific dietary modifications aiming towards targeted core components such as weight, hypertension, lipid management, diabetes, heart failure and other comorbidities.    Expected Outcomes  Short Term Goal: Understand basic principles of dietary content, such as calories, fat, sodium, cholesterol and nutrients.           Nutrition Assessments: Nutrition Assessments - 10/11/17 1342    MEDFICTS Scores          Pre Score  20           Nutrition Goals Re-Evaluation: Nutrition Goals Re-Evaluation    Goals    Row Name 10/11/17 1341   Current Weight  177 lb 0.5 oz (80.3 kg)          Nutrition Goals Re-Evaluation: Nutrition Goals Re-Evaluation    Goals    Row Name 10/11/17 1341   Current Weight  177 lb 0.5 oz (80.3 kg)          Nutrition Goals Discharge (Final Nutrition Goals Re-Evaluation): Nutrition Goals Re-Evaluation - 10/11/17 1341    Goals          Current Weight  177 lb 0.5 oz (80.3 kg)           Psychosocial: Target Goals: Acknowledge presence or absence of significant depression and/or stress, maximize coping skills, provide positive  support system. Participant is able to verbalize types and ability to use techniques and skills needed for reducing stress and depression.  Initial Review & Psychosocial  Screening: Initial Psych Review & Screening - 10/11/17 1149    Initial Review          Current issues with  None Identified        Family Dynamics          Good Support System?  No        Barriers          Psychosocial barriers to participate in program  There are no identifiable barriers or psychosocial needs.        Screening Interventions          Interventions  Encouraged to exercise           Quality of Life Scores: Quality of Life - 10/11/17 1157    Quality of Life          Select  Quality of Life        Quality of Life Scores          Health/Function Pre  29.2 %    Socioeconomic Pre  18.75 %    Psych/Spiritual Pre  30 %    Family Pre  30 %    GLOBAL Pre  27.09 %          Scores of 19 and below usually indicate a poorer quality of life in these areas.  A difference of  2-3 points is a clinically meaningful difference.  A difference of 2-3 points in the total score of the Quality of Life Index has been associated with significant improvement in overall quality of life, self-image, physical symptoms, and general health in studies assessing change in quality of life.  PHQ-9: Recent Review Flowsheet Data    Depression screen [facility] 2/9 10/15/2017   Decreased Interest 0   Down, Depressed, Hopeless 0   PHQ - 2 Score 0     Interpretation of Total Score  Total Score Depression Severity:  1-4 = Minimal depression, 5-9 = Mild depression, 10-14 = Moderate depression, 15-19 = Moderately severe depression, 20-27 = Severe depression   Psychosocial Evaluation and Intervention: Psychosocial Evaluation - 12/05/17 1707    Psychosocial Evaluation & Interventions          Interventions  Encouraged to exercise with the program and follow exercise prescription    Comments  No psychosocial needs identified. Khasir enjoys playing volleyball.    Expected Outcomes  Saabir will exhibit a positive outlook utilizing good coping skills.    Continue Psychosocial Services   No  Follow up required           Psychosocial Re-Evaluation: Psychosocial Re-Evaluation    Psychosocial Re-Evaluation    Row Name 11/02/17 1224 12/06/17 1624   Current issues with  None Identified  None Identified   Comments  no psychosocial needs identified, no interventions necessary   no psychosocial needs identified, no interventions necessary    Expected Outcomes  pt will exhibit postive outlook with good coping skills.   pt will exhibit postive outlook with good coping skills.    Interventions  Encouraged to attend Cardiac Rehabilitation for the exercise  Encouraged to attend Cardiac Rehabilitation for the exercise   Continue Psychosocial Services   no documentation  No Follow up required          Psychosocial Discharge (Final Psychosocial Re-Evaluation): Psychosocial Re-Evaluation - 12/06/17 1624    Psychosocial  Re-Evaluation          Current issues with  None Identified    Comments  no psychosocial needs identified, no interventions necessary     Expected Outcomes  pt will exhibit postive outlook with good coping skills.     Interventions  Encouraged to attend Cardiac Rehabilitation for the exercise    Continue Psychosocial Services   No Follow up required           Vocational Rehabilitation: Provide vocational rehab assistance to qualifying candidates.   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 Participant will state understanding/return demonstration of topics presented.  Learning Barriers/Preferences: Learning Barriers/Preferences - 10/11/17 0917    Learning Barriers/Preferences          Learning Barriers  None    Learning  Preferences  Skilled Demonstration           Education Topics: Count Your Pulse:  -Group instruction provided by verbal instruction, demonstration, patient participation and written materials to support subject.  Instructors address importance of being able to find your pulse and how to count your pulse when at home without a heart monitor.  Patients get hands on experience counting their pulse with staff help and individually. Flowsheet Row CARDIAC REHAB PHASE II EXERCISE from 11/28/2017 in [facility] CARDIAC REHAB  Date  11/09/17  Educator  RN  Instruction Review Code  2- Demonstrated Understanding      Heart Attack, Angina, and Risk Factor Modification:  -Group instruction provided by verbal instruction, video, and written materials to support subject.  Instructors address signs and symptoms of angina and heart attacks.    Also discuss risk factors for heart disease and how to make changes to improve heart health risk factors. Flowsheet Row CARDIAC REHAB PHASE II EXERCISE from 11/28/2017 in [facility] CARDIAC REHAB  Date  11/07/17  Educator  RN  Instruction Review Code  2- Demonstrated Understanding      Functional Fitness:  -Group instruction provided by verbal instruction, demonstration, patient participation, and written materials to support subject.  Instructors address safety measures for doing things around the house.  Discuss how to get up and down off the floor, how to pick things up properly, how to safely get out of a chair without assistance, and balance training. Flowsheet Row CARDIAC REHAB PHASE II EXERCISE from 11/28/2017 in [facility] CARDIAC REHAB  Date  11/16/17  Instruction Review Code  2- Demonstrated Understanding      Meditation and Mindfulness:  -Group instruction provided by verbal instruction, patient participation, and written materials to support subject.  Instructor addresses importance of mindfulness  and meditation practice to help reduce stress and improve awareness.  Instructor also leads participants through a meditation exercise.  Flowsheet Row CARDIAC REHAB PHASE II EXERCISE from 11/28/2017 in [facility] CARDIAC REHAB  Date  11/28/17  Educator  Theda Belfast  Instruction Review Code  2- Demonstrated Understanding      Stretching for Flexibility and Mobility:  -Group instruction provided by verbal instruction, patient participation, and written materials to support subject.  Instructors lead participants through  series of stretches that are designed to increase flexibility thus improving mobility.  These stretches are additional exercise for major muscle groups that are typically performed during regular warm up and cool down.   Hands Only CPR:  -Group verbal, video, and participation provides a basic overview of AHA guidelines for community CPR. Role-play of emergencies allow participants the opportunity to practice calling for help and chest compression technique with discussion of AED use.   Hypertension: -Group verbal and written instruction that provides a basic overview of hypertension including the most recent diagnostic guidelines, risk factor reduction with self-care instructions and medication management. Flowsheet Row CARDIAC REHAB PHASE II EXERCISE from 11/28/2017 in [facility] CARDIAC REHAB  Date  11/23/17  Educator  RN  Instruction Review Code  2- Demonstrated Understanding       Nutrition I class: Heart Healthy Eating:  -Group instruction provided by PowerPoint slides, verbal discussion, and written materials to support subject matter. The instructor gives an explanation and review of the Therapeutic Lifestyle Changes diet recommendations, which includes a discussion on lipid goals, dietary fat, sodium, fiber, plant stanol/sterol esters, sugar, and the components of a well-balanced, healthy diet.   Nutrition II class: Lifestyle  Skills:  -Group instruction provided by PowerPoint slides, verbal discussion, and written materials to support subject matter. The instructor gives an explanation and review of label reading, grocery shopping for heart health, heart healthy recipe modifications, and ways to make healthier choices when eating out.   Diabetes Question & Answer:  -Group instruction provided by PowerPoint slides, verbal discussion, and written materials to support subject matter. The instructor gives an explanation and review of diabetes co-morbidities, pre- and post-prandial blood glucose goals, pre-exercise blood glucose goals, signs, symptoms, and treatment of hypoglycemia and hyperglycemia, and foot care basics.   Diabetes Blitz:  -Group instruction provided by PowerPoint slides, verbal discussion, and written materials to support subject matter. The instructor gives an explanation and review of the physiology behind type 1 and type 2 diabetes, diabetes medications and rational behind using different medications, pre- and post-prandial blood glucose recommendations and Hemoglobin A1c goals, diabetes diet, and exercise including blood glucose guidelines for exercising safely.    Portion Distortion:  -Group instruction provided by PowerPoint slides, verbal discussion, written materials, and food models to support subject matter. The instructor gives an explanation of serving size versus portion size, changes in portions sizes over the last 20 years, and what consists of a serving from each food group.   Stress Management:  -Group instruction provided by verbal instruction, video, and written materials to support subject matter.  Instructors review role of stress in heart disease and how to cope with stress positively.     Exercising on Your Own:  -Group instruction provided by verbal instruction, power point, and written materials to support subject.  Instructors discuss benefits of exercise, components of  exercise, frequency and intensity of exercise, and end points for exercise.  Also discuss use of nitroglycerin and activating EMS.  Review options of places to exercise outside of rehab.  Review guidelines for sex with heart disease. Flowsheet Row CARDIAC REHAB PHASE II EXERCISE from 11/28/2017 in [facility] CARDIAC REHAB  Date  11/21/17  Educator  EP  Instruction Review Code  2- Demonstrated Understanding      Cardiac Drugs I:  -Group instruction provided by verbal instruction and written materials to support subject.  Instructor reviews cardiac drug classes: antiplatelets, anticoagulants, beta blockers, and statins.  Instructor discusses  reasons, side effects, and lifestyle considerations for each drug class.   Cardiac Drugs II:  -Group instruction provided by verbal instruction and written materials to support subject.  Instructor reviews cardiac drug classes: angiotensin converting enzyme inhibitors (ACE-I), angiotensin II receptor blockers (ARBs), nitrates, and calcium channel blockers.  Instructor discusses reasons, side effects, and lifestyle considerations for each drug class.   Anatomy and Physiology of the Circulatory System:  Group verbal and written instruction and models provide basic cardiac anatomy and physiology, with the coronary electrical and arterial systems. Review of: AMI, Angina, Valve disease, Heart Failure, Peripheral Artery Disease, Cardiac Arrhythmia, Pacemakers, and the ICD. Flowsheet Row CARDIAC REHAB PHASE II EXERCISE from 11/28/2017 in [facility] CARDIAC REHAB  Date  10/31/17  Educator  RN  Instruction Review Code  2- Demonstrated Understanding      Other Education:  -Group or individual verbal, written, or video instructions that support the educational goals of the cardiac rehab program.   Holiday Eating Survival Tips:  -Group instruction provided by PowerPoint slides, verbal discussion, and written materials to support  subject matter. The instructor gives patients tips, tricks, and techniques to help them not only survive but enjoy the holidays despite the onslaught of food that accompanies the holidays.   Knowledge Questionnaire Score: Knowledge Questionnaire Score - 10/11/17 1147    Knowledge Questionnaire Score          Pre Score  22/28           Core Components/Risk Factors/Patient Goals at Admission: Personal Goals and Risk Factors at Admission - 10/11/17 1240    Core Components/Risk Factors/Patient Goals on Admission           Weight Management  Yes;Weight Maintenance;Weight Loss    Intervention  Weight Management: Develop a combined nutrition and exercise program designed to reach desired caloric intake, while maintaining appropriate intake of nutrient and fiber, sodium and fats, and appropriate energy expenditure required for the weight goal.;Weight Management: Provide education and appropriate resources to help participant work on and attain dietary goals.    Admit Weight  177 lb 0.5 oz (80.3 kg)    Goal Weight: Short Term  170 lb (77.1 kg)    Goal Weight: Long Term  165 lb (74.8 kg)    Expected Outcomes  Short Term: Continue to assess and modify interventions until short term weight is achieved;Long Term: Adherence to nutrition and physical activity/exercise program aimed toward attainment of established weight goal;Weight Maintenance: Understanding of the daily nutrition guidelines, which includes 25-35% calories from fat, 7% or less cal from saturated fats, less than 200mg  cholesterol, less than 1.5gm of sodium, & 5 or more servings of fruits and vegetables daily;Weight Loss: Understanding of general recommendations for a balanced deficit meal plan, which promotes 1-2 lb weight loss per week and includes a negative energy balance of [phone] kcal/d;Understanding recommendations for meals to include 15-35% energy as protein, 25-35% energy from fat, 35-60% energy from carbohydrates, less than 200mg   of dietary cholesterol, 20-35 gm of total fiber daily;Understanding of distribution of calorie intake throughout the day with the consumption of 4-5 meals/snacks    Hypertension  Yes    Intervention  Provide education on lifestyle modifcations including regular physical activity/exercise, weight management, moderate sodium restriction and increased consumption of fresh fruit, vegetables, and low fat dairy, alcohol moderation, and smoking cessation.;Monitor prescription use compliance.    Expected Outcomes  Short Term: Continued assessment and intervention until BP is < 140/108mm HG in hypertensive participants. <  130/57mm HG in hypertensive participants with diabetes, heart failure or chronic kidney disease.;Long Term: Maintenance of blood pressure at goal levels.    Lipids  Yes    Intervention  Provide education and support for participant on nutrition & aerobic/resistive exercise along with prescribed medications to achieve LDL 70mg , HDL >40mg .    Expected Outcomes  Short Term: Participant states understanding of desired cholesterol values and is compliant with medications prescribed. Participant is following exercise prescription and nutrition guidelines.;Long Term: Cholesterol controlled with medications as prescribed, with individualized exercise RX and with personalized nutrition plan. Value goals: LDL < 70mg , HDL > 40 mg.           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 Russell would like to increase his stamina to go back to work.  Jahi has multiple CAD RFs and is willing  to participate in exercise. Lorie would like to increase his stamina to go back to work.  Kamar has multiple CAD RFs and is willing to participate in exercise. Deago would like to increase his stamina to go back to work.  pt is walking at home.    Expected Outcomes  Pt will participate in CR exercise, nutrition, and lifestyle modification opportunities.   Pt will participate in CR exercise, nutrition, and lifestyle modification opportunities.   Pt will participate in CR exercise, nutrition, and lifestyle modification opportunities.           Core Components/Risk Factors/Patient Goals at Discharge (Final Review):  Goals and Risk Factor Review - 12/05/17 1707    Core Components/Risk Factors/Patient Goals Review          Personal Goals Review  Weight Management/Obesity;Lipids;Hypertension;Diabetes    Review  Chauncy has multiple CAD RFs and is willing to participate in exercise. Michaelpaul would like to increase his stamina to go back to work.  pt is walking at home.     Expected Outcomes  Pt will participate in CR exercise, nutrition, and lifestyle modification opportunities.            ITP Comments: ITP Comments    Row Name 10/11/17 0913 10/15/17 1044 11/02/17 1222 12/05/17 1706   ITP Comments  Dr. Armanda Magic, Medical Director  Rohaan started exercise today.  He tolerated it well.   30 day ITP reveiw.  pt with good attendance and participation. P pt feels comfortable communicating with staff and peers without interpreter services. t interpreter will come PRN at pt request.   30 day ITP reveiw.  pt with good attendance and participation. pt demonstrates eagerness to participate in CR program.       Comments:

## 2017-12-07 ENCOUNTER — Encounter (HOSPITAL_COMMUNITY)
Admission: RE | Admit: 2017-12-07 | Discharge: 2017-12-07 | Disposition: A | Discharge status: Home / Self Care | Payer: BLUE CROSS/BLUE SHIELD | Source: Ambulatory Visit | Attending: Cardiology | Admitting: Cardiology

## 2017-12-07 ENCOUNTER — Encounter (HOSPITAL_COMMUNITY): Payer: BLUE CROSS/BLUE SHIELD

## 2017-12-07 DIAGNOSIS — Z951 Presence of aortocoronary bypass graft: Secondary | ICD-10-CM

## 2017-12-07 DIAGNOSIS — I214 Non-ST elevation (NSTEMI) myocardial infarction: Secondary | ICD-10-CM

## 2017-12-10 ENCOUNTER — Encounter (HOSPITAL_COMMUNITY): Payer: BLUE CROSS/BLUE SHIELD

## 2017-12-10 ENCOUNTER — Encounter (HOSPITAL_COMMUNITY)
Admission: RE | Admit: 2017-12-10 | Discharge: 2017-12-10 | Disposition: A | Discharge status: Home / Self Care | Payer: BLUE CROSS/BLUE SHIELD | Source: Ambulatory Visit | Attending: Cardiology | Admitting: Cardiology

## 2017-12-10 DIAGNOSIS — I214 Non-ST elevation (NSTEMI) myocardial infarction: Secondary | ICD-10-CM

## 2017-12-10 DIAGNOSIS — Z951 Presence of aortocoronary bypass graft: Secondary | ICD-10-CM

## 2017-12-12 ENCOUNTER — Encounter (HOSPITAL_COMMUNITY): Payer: BLUE CROSS/BLUE SHIELD

## 2017-12-14 ENCOUNTER — Encounter (HOSPITAL_COMMUNITY): Payer: BLUE CROSS/BLUE SHIELD

## 2017-12-17 ENCOUNTER — Encounter (HOSPITAL_COMMUNITY): Payer: BLUE CROSS/BLUE SHIELD

## 2017-12-19 ENCOUNTER — Encounter (HOSPITAL_COMMUNITY): Payer: BLUE CROSS/BLUE SHIELD

## 2017-12-21 ENCOUNTER — Encounter (HOSPITAL_COMMUNITY): Payer: BLUE CROSS/BLUE SHIELD

## 2017-12-24 ENCOUNTER — Encounter (HOSPITAL_COMMUNITY): Payer: BLUE CROSS/BLUE SHIELD

## 2017-12-24 ENCOUNTER — Encounter (HOSPITAL_COMMUNITY): Admission: RE | Admit: 2017-12-24 | Payer: BLUE CROSS/BLUE SHIELD | Source: Ambulatory Visit

## 2017-12-26 ENCOUNTER — Encounter (HOSPITAL_COMMUNITY): Payer: BLUE CROSS/BLUE SHIELD

## 2017-12-28 ENCOUNTER — Encounter (HOSPITAL_COMMUNITY): Payer: BLUE CROSS/BLUE SHIELD

## 2017-12-28 ENCOUNTER — Telehealth (HOSPITAL_COMMUNITY): Payer: Self-pay | Admitting: Cardiac Rehabilitation

## 2017-12-28 ENCOUNTER — Encounter (HOSPITAL_COMMUNITY): Payer: Self-pay | Admitting: Cardiac Rehabilitation

## 2017-12-28 NOTE — Telephone Encounter (Signed)
Attempt to contact pt to assess reason for continued absence from cardiac rehab with assistance from interpreter services. NA, voicemail box is full.   Deveron Furlong, RN, BSN Cardiac Pulmonary Rehab

## 2017-12-31 ENCOUNTER — Encounter (HOSPITAL_COMMUNITY)
Admission: RE | Admit: 2017-12-31 | Discharge: 2017-12-31 | Disposition: A | Discharge status: Home / Self Care | Payer: BLUE CROSS/BLUE SHIELD | Source: Ambulatory Visit | Attending: Cardiology | Admitting: Cardiology

## 2017-12-31 ENCOUNTER — Encounter (HOSPITAL_COMMUNITY): Payer: BLUE CROSS/BLUE SHIELD

## 2017-12-31 DIAGNOSIS — Z951 Presence of aortocoronary bypass graft: Secondary | ICD-10-CM

## 2017-12-31 DIAGNOSIS — I214 Non-ST elevation (NSTEMI) myocardial infarction: Secondary | ICD-10-CM

## 2018-01-02 ENCOUNTER — Encounter (HOSPITAL_COMMUNITY): Payer: BLUE CROSS/BLUE SHIELD

## 2018-01-02 ENCOUNTER — Encounter (HOSPITAL_COMMUNITY)
Admission: RE | Admit: 2018-01-02 | Discharge: 2018-01-02 | Disposition: A | Discharge status: Home / Self Care | Payer: BLUE CROSS/BLUE SHIELD | Source: Ambulatory Visit | Attending: Cardiology | Admitting: Cardiology

## 2018-01-02 DIAGNOSIS — E785 Hyperlipidemia, unspecified: Secondary | ICD-10-CM | POA: Insufficient documentation

## 2018-01-02 DIAGNOSIS — E119 Type 2 diabetes mellitus without complications: Secondary | ICD-10-CM | POA: Insufficient documentation

## 2018-01-02 DIAGNOSIS — I214 Non-ST elevation (NSTEMI) myocardial infarction: Secondary | ICD-10-CM | POA: Insufficient documentation

## 2018-01-02 DIAGNOSIS — I10 Essential (primary) hypertension: Secondary | ICD-10-CM | POA: Diagnosis not present

## 2018-01-02 DIAGNOSIS — Z951 Presence of aortocoronary bypass graft: Secondary | ICD-10-CM | POA: Diagnosis not present

## 2018-01-02 DIAGNOSIS — I2510 Atherosclerotic heart disease of native coronary artery without angina pectoris: Secondary | ICD-10-CM | POA: Diagnosis not present

## 2018-01-03 NOTE — Progress Notes (Signed)
Cardiac Individual Treatment Plan  Patient Details  Name: [name] MRN: [id number] Date of Birth: Jul 09, 1960 Referring Provider:   Flowsheet Row CARDIAC REHAB PHASE II ORIENTATION from 10/11/2017 in [facility] CARDIAC REHAB  Referring Provider  Armanda Magic MD      Initial Encounter Date:  Flowsheet Row CARDIAC REHAB PHASE II ORIENTATION from 10/11/2017 in [facility] CARDIAC REHAB  Date  10/11/17      Visit Diagnosis: NSTEMI (non-ST elevated myocardial infarction) (HCC)  S/P CABG x 5  Patient's Home Medications on Admission:  Current Outpatient Medications:  .  acetaminophen (TYLENOL) 500 MG tablet, Take 1 tablet (500 mg total) by mouth every 6 (six) hours as needed., Disp: 30 tablet, Rfl: 0 .  aspirin EC 81 MG EC tablet, Take 1 tablet (81 mg total) by mouth daily., Disp: , Rfl:  .  atorvastatin (LIPITOR) 80 MG tablet, Take 1 tablet (80 mg total) by mouth daily at 6 PM., Disp: 90 tablet, Rfl: 3 .  lidocaine (XYLOCAINE) 2 % solution, Use as directed 20 mLs in the mouth or throat as needed for mouth pain., Disp: 100 mL, Rfl: 0 .  losartan-hydrochlorothiazide (HYZAAR) 100-25 MG tablet, Take 1 tablet by mouth daily., Disp: 90 tablet, Rfl: 3 .  metFORMIN (GLUCOPHAGE) 1000 MG tablet, Take 1,000 mg by mouth 2 (two) times daily., Disp: , Rfl: 2 .  metoprolol tartrate (LOPRESSOR) 50 MG tablet, Take 1 tablet (50 mg total) by mouth 2 (two) times daily., Disp: 180 tablet, Rfl: 3 .  ondansetron (ZOFRAN) 4 MG tablet, Take 1 tablet (4 mg total) by mouth every 8 (eight) hours as needed for nausea or vomiting., Disp: 20 tablet, Rfl: 0  Past Medical History: Past Medical History:  Diagnosis Date  . CAD (coronary artery disease), native coronary artery 09/20/2017  . Diabetes mellitus without complication (HCC)   . Hyperlipidemia   . Hypertension     Tobacco Use: Social History   Tobacco Use  Smoking Status Former Smoker  . Last attempt to quit:  07/22/1998  . Years since quitting: 19.4  Smokeless Tobacco Never Used    Labs: Recent Hydrographic surveyor    Labs for ITP Cardiac and Pulmonary Rehab Latest Ref Rng & Units 07/23/2017 07/23/2017 07/23/2017 07/24/2017 07/24/2017   Cholestrol 0 - 200 mg/dL - - - - -   LDLCALC 0 - 99 mg/dL - - - - -   HDL >40 mg/dL - - - - -   Trlycerides <150 mg/dL - - - - -   Hemoglobin A1c 4.8 - 5.6 % - - - - -   PHART 7.350 - 7.450 7.372 - 7.345(L) - -   PCO2ART 32.0 - 48.0 mmHg 34.0 - 32.2 - -   HCO3 20.0 - 28.0 mmol/L 19.8(L) - 17.5(L) - -   TCO2 22 - 32 mmol/L 21(L) 21(L) 19(L) 13(L) 23   ACIDBASEDEF 0.0 - 2.0 mmol/L 5.0(H) - 7.0(H) - -   O2SAT % 99.0 - 91.0 - -      Capillary Blood Glucose: Lab Results  Component Value Date   GLUCAP 109 (H) 10/17/2017   GLUCAP 179 (H) 10/17/2017   GLUCAP 109 (H) 10/15/2017   GLUCAP 135 (H) 10/15/2017   GLUCAP 114 (H) 07/30/2017     Exercise Target Goals: Exercise Program Goal: Individual exercise prescription set using results from initial 6 min walk test and THRR while considering  patient's activity barriers and safety.   Exercise Prescription Goal: Initial exercise  prescription builds to 30-45 minutes a day of aerobic activity, 2-3 days per week.  Home exercise guidelines will be given to patient during program as part of exercise prescription that the participant will acknowledge.  Activity Barriers & Risk Stratification: Activity Barriers & Cardiac Risk Stratification - 10/11/17 0917    Activity Barriers & Cardiac Risk Stratification          Activity Barriers  None    Cardiac Risk Stratification  High           6 Minute Walk: 6 Minute Walk    6 Minute Walk    Row Name 10/11/17 1239   Phase  Initial   Distance  1647 feet   Walk Time  6 minutes   # of Rest Breaks  0   MPH  3.1   METS  4.1   RPE  11   VO2 Peak  14.5   Symptoms  No   Resting HR  67 bpm   Resting BP  112/68   Resting Oxygen Saturation   99 %   Exercise Oxygen  Saturation  during 6 min walk  100 %   Max Ex. HR  89 bpm   Max Ex. BP  120/60   2 Minute Post BP  104/60          Oxygen Initial Assessment:   Oxygen Re-Evaluation:   Oxygen Discharge (Final Oxygen Re-Evaluation):   Initial Exercise Prescription: Initial Exercise Prescription - 10/11/17 1200    Date of Initial Exercise RX and Referring Provider          Date  10/11/17    Referring Provider  Armanda Magic MD    Expected Discharge Date  01/13/18        Bike          Level  0.8    Minutes  10    METs  2.89        NuStep          Level  3    SPM  80    Minutes  10    METs  2        Track          Laps  11    Minutes  10    METs  2.92        Prescription Details          Frequency (times per week)  3    Duration  Progress to 30 minutes of continuous aerobic without signs/symptoms of physical distress        Intensity          THRR 40-80% of Max Heartrate  66-132    Ratings of Perceived Exertion  11-13    Perceived Dyspnea  0-4        Progression          Progression  Continue to progress workloads to maintain intensity without signs/symptoms of physical distress.        Resistance Training          Training Prescription  Yes    Weight  3lbs    Reps  10-15           Perform Capillary Blood Glucose checks as needed.  Exercise Prescription Changes: Exercise Prescription Changes    Response to Exercise    Row Name 10/15/17 1627 11/02/17 1100 11/14/17 1100 11/30/17 1333 12/10/17 1555   Blood Pressure (Admit)  122/60  116/70  118/72  108/70  118/72   Blood Pressure (Exercise)  162/82  128/68  128/70  122/60  134/80   Blood Pressure (Exit)  102/60  100/62  102/68  118/60  120/70   Heart Rate (Admit)  81 bpm  62 bpm  66 bpm  66 bpm  69 bpm   Heart Rate (Exercise)  116 bpm  100 bpm  100 bpm  116 bpm  118 bpm   Heart Rate (Exit)  73 bpm  61 bpm  66 bpm  76 bpm  72 bpm   Rating of Perceived Exertion (Exercise)  11  12  12  12  13    Perceived  Dyspnea (Exercise)  no documentation  no documentation  no documentation  0  0   Symptoms  none   none   none   None   None    Comments  pt oriented to exercise equipment today  no documentation  no documentation  no documentation  None   Duration  Continue with 30 min of aerobic exercise without signs/symptoms of physical distress.  Continue with 30 min of aerobic exercise without signs/symptoms of physical distress.  Continue with 30 min of aerobic exercise without signs/symptoms of physical distress.  Continue with 30 min of aerobic exercise without signs/symptoms of physical distress.  Continue with 30 min of aerobic exercise without signs/symptoms of physical distress.   Intensity  THRR unchanged  THRR unchanged  THRR unchanged  THRR unchanged  THRR unchanged       Progression    Row Name 10/15/17 1627 11/02/17 1100 11/14/17 1100 11/30/17 1333 12/10/17 1555   Progression  Continue to progress workloads to maintain intensity without signs/symptoms of physical distress.  Continue to progress workloads to maintain intensity without signs/symptoms of physical distress.  Continue to progress workloads to maintain intensity without signs/symptoms of physical distress.  Continue to progress workloads to maintain intensity without signs/symptoms of physical distress.  Continue to progress workloads to maintain intensity without signs/symptoms of physical distress.   Average METs  2.9  4.3  4.3  4.8  5       Resistance Training    Row Name 10/15/17 1627 11/02/17 1100 11/14/17 1100 11/30/17 1333 12/10/17 1555   Training Prescription  Yes  Yes  No relaxation day  Yes  Yes   Weight  3lbs  5lbs  no documentation  6lbs  9lbs   Reps  10-15  10-15  no documentation  10-15  10-15   Time  10 Minutes  10 Minutes  10 Minutes  10 Minutes  10 Minutes       Interval Training    Row Name 10/15/17 1627 11/02/17 1100 11/14/17 1100 11/30/17 1333 12/10/17 1555   Interval Training  no documentation  no documentation   no documentation  No  No       Bike    Row Name 10/15/17 1627 11/02/17 1100 11/14/17 1100 11/30/17 1333 12/10/17 1555   Level  0.8  1.8  1.8  2  2    Minutes  10  10  10  10  10    METs  2.88  5.35  5.36  5.81  5.81       NuStep    Row Name 10/15/17 1627 11/02/17 1100 11/14/17 1100 11/30/17 1333 12/10/17 1555   Level  3  4  4  4  4    SPM  80  90  100  100  100   Minutes  10  10  10  10  5.4   METs  2.1  3.8  4.7  4.5  4.5       Track    Row Name 10/15/17 1627 11/02/17 1100 11/14/17 1100 11/30/17 1333 12/10/17 1555   Laps  16  16  11  18  16    Minutes  10  10  10  10  10    METs  3.79  3.79  2.92  4.14  3.76       Home Exercise Plan    Row Name 10/15/17 1627 11/02/17 1100 11/14/17 1100 11/30/17 1333 12/10/17 1555   Plans to continue exercise at  no documentation  Home (comment) walking  Home (comment) walking  Home (comment) walking  Home (comment) walking   Frequency  no documentation  Add 3 additional days to program exercise sessions.  Add 3 additional days to program exercise sessions.  Add 3 additional days to program exercise sessions.  Add 3 additional days to program exercise sessions.   Initial Home Exercises Provided  no documentation  10/26/17  10/26/17  10/26/17  10/26/17       Response to Exercise    Row Name 01/02/18 1226   Blood Pressure (Admit)  108/60   Blood Pressure (Exercise)  122/70   Blood Pressure (Exit)  108/60   Heart Rate (Admit)  72 bpm   Heart Rate (Exercise)  109 bpm   Heart Rate (Exit)  76 bpm   Rating of Perceived Exertion (Exercise)  12   Perceived Dyspnea (Exercise)  0   Symptoms  None    Comments  None   Duration  Continue with 30 min of aerobic exercise without signs/symptoms of physical distress.   Intensity  THRR unchanged       Progression    Row Name 01/02/18 1226   Progression  Continue to progress workloads to maintain intensity without signs/symptoms of physical distress.   Average METs  5       Resistance Training    Row  Name 01/02/18 1226   Training Prescription  No       Interval Training    Row Name 01/02/18 1226   Interval Training  No       Bike    Row Name 01/02/18 1226   Level  2   Minutes  10   METs  5.81       NuStep    Row Name 01/02/18 1226   Level  4   SPM  100   Minutes  5.4   METs  4.9       Track    Row Name 01/02/18 1226   Laps  16   Minutes  10   METs  3.76       Home Exercise Plan    Row Name 01/02/18 1226   Plans to continue exercise at  Home (comment) walking   Frequency  Add 3 additional days to program exercise sessions.   Initial Home Exercises Provided  10/26/17          Exercise Comments: Exercise Comments    Row Name 10/15/17 1628 11/02/17 1112 11/26/17 1659 01/03/18 1227   Exercise Comments  Pt was oriented to exercise equipment today and was able to exercise for 30 minutes without difficulty.   Reviewed METs and goals. Pt is tolerating exercise program very well; rehab staff will continue to monitor activity levels and progress as tolerated.   Reviewed METs and goals. Pt is tolerating exercise program very well;  rehab staff will continue to monitor activity levels and progress as tolerated.   Reviewed METs and Goals with pt. Pt is continuing to respond well to exercise workloads. Will continue to monitor.       Exercise Goals and Review: Exercise Goals    Exercise Goals    Row Name 10/11/17 0918 10/11/17 1240   Increase Physical Activity  Yes  no documentation   Intervention  Provide advice, education, support and counseling about physical activity/exercise needs.;Develop an individualized exercise prescription for aerobic and resistive training based on initial evaluation findings, risk stratification, comorbidities and participant's personal goals.  no documentation   Expected Outcomes  Short Term: Attend rehab on a regular basis to increase amount of physical activity.;Long Term: Add in home exercise to make exercise part of routine and to increase  amount of physical activity.;Long Term: Exercising regularly at least 3-5 days a week.  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  no documentation   Expected Outcomes  Long Term: Improve cardiorespiratory fitness, muscular endurance and strength as measured by increased METs and functional capacity ( );Short Term: Perform resistance training exercises routinely during rehab and add in resistance training at home;Short Term: Increase workloads from initial exercise prescription for resistance, speed, and METs.  no documentation   Able to understand and use rate of perceived exertion (RPE) scale  Yes  no documentation   Intervention  Provide education and explanation on how to use RPE scale  no documentation   Expected Outcomes  Short Term: Able to use RPE daily in rehab to express subjective intensity level;Long Term:  Able to use RPE to guide intensity level when exercising independently  no documentation   Knowledge and understanding of Target Heart Rate Range (THRR)  Yes  no documentation   Intervention  Provide education and explanation of THRR including how the numbers were predicted and where they are located for reference  no documentation   Expected Outcomes  Short Term: Able to state/look up THRR;Long Term: Able to use THRR to govern intensity when exercising independently;Short Term: Able to use daily as guideline for intensity in rehab  no documentation   Able to check pulse independently  Yes  no documentation   Intervention  Provide education and demonstration on how to check pulse in carotid  and radial arteries.;Review the importance of being able to check your own pulse for safety during independent exercise  no documentation   Expected Outcomes  Short Term: Able to explain why pulse checking is important during independent exercise;Long Term: Able to check pulse independently and accurately  no documentation   Understanding of Exercise Prescription  Yes  no documentation   Intervention  Provide education, explanation, and written materials on patient's individual exercise prescription  no documentation   Expected Outcomes  Long Term: Able to explain home exercise prescription to exercise independently;Short Term: Able to explain program exercise prescription  no documentation          Exercise Goals Re-Evaluation : Exercise Goals Re-Evaluation    Exercise Goal Re-Evaluation    Row Name 10/26/17 1200 11/26/17 1658 01/03/18 1227   Exercise Goals Review  Increase Physical Activity;Able to understand and  use rate of perceived exertion (RPE) scale;Knowledge and understanding of Target Heart Rate Range (THRR);Increase Strength and Stamina;Able to check pulse independently;Improve claudication pain tolerance and improve walking ability;Understanding of Exercise Prescription  Increase Physical Activity;Able to understand and use rate of perceived exertion (RPE) scale;Knowledge and understanding of Target Heart Rate Range (THRR);Increase Strength and Stamina;Able to check pulse independently;Improve claudication pain tolerance and improve walking ability;Understanding of Exercise Prescription  Increase Physical Activity;Understanding of Exercise Prescription   Comments  Reviewed HEP in which pt plans to walk for exercise for 30 minutes, 3x/week . Pt is also progressing well in cardiac rehab and handles WL increases very well.  Pt is compliant with HEP in which he does walk for 30 minutes 3x/week. Pt is also progressing well in cardiac rehab in which he tolerates WL increases very well.   Pt is  responding well to exercise. Pt will be graduating from rehab soon. Will work with pt to increase Nustep level to 5.    Expected Outcomes  Pt will continue to improve in cardiorespiratory fitness by coming to cardiac rehab and being compliant with HEP.  Pt will continue to improve in cardiorespiratory fitness by coming to cardiac rehab and being compliant with HEP.  Pt will continue to walk 3-4 days a week for 30 minutes. Pt will continue to increase cardiorespiratory fitness. Will continue to monitor and progress pt as tolerated.           Discharge Exercise Prescription (Final Exercise Prescription Changes): Exercise Prescription Changes - 01/02/18 1226    Response to Exercise          Blood Pressure (Admit)  108/60    Blood Pressure (Exercise)  122/70    Blood Pressure (Exit)  108/60    Heart Rate (Admit)  72 bpm    Heart Rate (Exercise)  109 bpm    Heart Rate (Exit)  76 bpm    Rating of Perceived Exertion (Exercise)  12    Perceived Dyspnea (Exercise)  0    Symptoms  None     Comments  None    Duration  Continue with 30 min of aerobic exercise without signs/symptoms of physical distress.    Intensity  THRR unchanged        Progression          Progression  Continue to progress workloads to maintain intensity without signs/symptoms of physical distress.    Average METs  5        Resistance Training          Training Prescription  No        Interval Training          Interval Training  No        Bike          Level  2    Minutes  10    METs  5.81        NuStep          Level  4    SPM  100    Minutes  5.4    METs  4.9        Track          Laps  16    Minutes  10    METs  3.76        Home Exercise Plan          Plans to continue exercise at  Home (comment)   walking   Frequency  Add  3 additional days to program exercise sessions.    Initial Home Exercises Provided  10/26/17           Nutrition:  Target Goals: Understanding of nutrition  guidelines, daily intake of sodium 1500mg , cholesterol 200mg , calories 30% from fat and 7% or less from saturated fats, daily to have 5 or more servings of fruits and vegetables.  Biometrics: Pre Biometrics - 10/11/17 1230    Pre Biometrics          Height  5\' 7"  (1.702 m)    Weight  80.3 kg    Waist Circumference  39.5 inches    Hip Circumference  39.5 inches    Waist to Hip Ratio  1 %    BMI (Calculated)  27.72    Triceps Skinfold  25 mm    % Body Fat  29.1 %    Grip Strength  45 kg    Flexibility  8 in    Single Leg Stand  30 seconds            Nutrition Therapy Plan and Nutrition Goals: Nutrition Therapy & Goals - 10/11/17 1341    Nutrition Therapy          Diet  consistent carbohydrate heart healthy        Personal Nutrition Goals          Nutrition Goal  Pt to identify food quantities necessary to achieve weight loss of 6-24 lbs.    Personal Goal #2  Pt to eat more and a variety of non-starchy vegetables        Intervention Plan          Intervention  Prescribe, educate and counsel regarding individualized specific dietary modifications aiming towards targeted core components such as weight, hypertension, lipid management, diabetes, heart failure and other comorbidities.    Expected Outcomes  Short Term Goal: Understand basic principles of dietary content, such as calories, fat, sodium, cholesterol and nutrients.           Nutrition Assessments: Nutrition Assessments - 10/11/17 1342    MEDFICTS Scores          Pre Score  20           Nutrition Goals Re-Evaluation: Nutrition Goals Re-Evaluation    Goals    Row Name 10/11/17 1341   Current Weight  177 lb 0.5 oz (80.3 kg)          Nutrition Goals Re-Evaluation: Nutrition Goals Re-Evaluation    Goals    Row Name 10/11/17 1341   Current Weight  177 lb 0.5 oz (80.3 kg)          Nutrition Goals Discharge (Final Nutrition Goals Re-Evaluation): Nutrition Goals Re-Evaluation - 10/11/17 1341     Goals          Current Weight  177 lb 0.5 oz (80.3 kg)           Psychosocial: Target Goals: Acknowledge presence or absence of significant depression and/or stress, maximize coping skills, provide positive support system. Participant is able to verbalize types and ability to use techniques and skills needed for reducing stress and depression.  Initial Review & Psychosocial Screening: Initial Psych Review & Screening - 10/11/17 1149    Initial Review          Current issues with  None Identified        Family Dynamics          Good Support System?  No  Barriers          Psychosocial barriers to participate in program  There are no identifiable barriers or psychosocial needs.        Screening Interventions          Interventions  Encouraged to exercise           Quality of Life Scores: Quality of Life - 10/11/17 1157    Quality of Life          Select  Quality of Life        Quality of Life Scores          Health/Function Pre  29.2 %    Socioeconomic Pre  18.75 %    Psych/Spiritual Pre  30 %    Family Pre  30 %    GLOBAL Pre  27.09 %          Scores of 19 and below usually indicate a poorer quality of life in these areas.  A difference of  2-3 points is a clinically meaningful difference.  A difference of 2-3 points in the total score of the Quality of Life Index has been associated with significant improvement in overall quality of life, self-image, physical symptoms, and general health in studies assessing change in quality of life.  PHQ-9: Recent Review Flowsheet Data    Depression screen [facility] 2/9 10/15/2017   Decreased Interest 0   Down, Depressed, Hopeless 0   PHQ - 2 Score 0     Interpretation of Total Score  Total Score Depression Severity:  1-4 = Minimal depression, 5-9 = Mild depression, 10-14 = Moderate depression, 15-19 = Moderately severe depression, 20-27 = Severe depression   Psychosocial Evaluation and  Intervention: Psychosocial Evaluation - 12/05/17 1707    Psychosocial Evaluation & Interventions          Interventions  Encouraged to exercise with the program and follow exercise prescription    Comments  No psychosocial needs identified. Galen enjoys playing volleyball.    Expected Outcomes  Braylin will exhibit a positive outlook utilizing good coping skills.    Continue Psychosocial Services   No Follow up required           Psychosocial Re-Evaluation: Psychosocial Re-Evaluation    Psychosocial Re-Evaluation    Row Name 11/02/17 1224 12/06/17 1624   Current issues with  None Identified  None Identified   Comments  no psychosocial needs identified, no interventions necessary   no psychosocial needs identified, no interventions necessary    Expected Outcomes  pt will exhibit postive outlook with good coping skills.   pt will exhibit postive outlook with good coping skills.    Interventions  Encouraged to attend Cardiac Rehabilitation for the exercise  Encouraged to attend Cardiac Rehabilitation for the exercise   Continue Psychosocial Services   no documentation  No Follow up required          Psychosocial Discharge (Final Psychosocial Re-Evaluation): Psychosocial Re-Evaluation - 12/06/17 1624    Psychosocial Re-Evaluation          Current issues with  None Identified    Comments  no psychosocial needs identified, no interventions necessary     Expected Outcomes  pt will exhibit postive outlook with good coping skills.     Interventions  Encouraged to attend Cardiac Rehabilitation for the exercise    Continue Psychosocial Services   No Follow up required           Vocational Rehabilitation: Provide vocational rehab assistance  to qualifying candidates.   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 Participant will state understanding/return demonstration of topics presented.  Learning Barriers/Preferences: Learning Barriers/Preferences - 10/11/17 0917    Learning Barriers/Preferences          Learning Barriers  None    Learning Preferences  Skilled Demonstration           Education Topics: Count Your Pulse:  -Group instruction provided by verbal instruction, demonstration, patient participation and written materials to support subject.  Instructors address importance of being able to find your pulse and how to count your pulse when at home without a heart monitor.  Patients get hands on experience counting their pulse with staff help and individually. Flowsheet Row CARDIAC REHAB PHASE II EXERCISE from 01/02/2018 in [facility] CARDIAC REHAB  Date  11/09/17  Educator  RN  Instruction Review Code  2- Demonstrated Understanding      Heart Attack, Angina, and Risk Factor Modification:  -Group instruction provided by verbal instruction, video, and written materials to support subject.  Instructors address signs and symptoms of angina and heart attacks.    Also discuss risk factors for heart disease and how to make changes to improve heart health risk factors. Flowsheet Row CARDIAC REHAB PHASE II EXERCISE from 01/02/2018 in [facility] CARDIAC REHAB  Date  01/02/18  Educator  RN  Instruction Review Code  2- Demonstrated Understanding      Functional Fitness:  -Group instruction provided by verbal instruction, demonstration, patient participation, and written materials to support subject.  Instructors address safety measures for doing things around the house.  Discuss how to get up and down off the floor, how to pick things up properly, how to safely get out of a chair without assistance, and  balance training. Flowsheet Row CARDIAC REHAB PHASE II EXERCISE from 01/02/2018 in [facility] CARDIAC REHAB  Date  11/16/17  Instruction Review Code  2- Demonstrated Understanding      Meditation and Mindfulness:  -Group instruction provided by verbal instruction, patient participation, and written materials to support subject.  Instructor addresses importance of mindfulness and meditation practice to help reduce stress and improve awareness.  Instructor also leads participants through a meditation exercise.  Flowsheet Row CARDIAC REHAB PHASE II EXERCISE from 01/02/2018 in [facility] CARDIAC REHAB  Date  11/28/17  Educator  Theda Belfast  Instruction Review Code  2- Demonstrated Understanding      Stretching for Flexibility and Mobility:  -Group instruction provided by verbal instruction, patient participation, and written materials to support subject.  Instructors lead participants through series of stretches that are designed to increase flexibility thus improving mobility.  These stretches are additional exercise for major muscle groups that are typically performed during regular warm up and cool down.   Hands Only CPR:  -Group verbal, video, and participation provides a basic overview of AHA guidelines for community CPR. Role-play of emergencies allow participants the opportunity to practice calling for help and chest compression technique with discussion of AED use.   Hypertension: -Group verbal and written instruction that provides a  basic overview of hypertension including the most recent diagnostic guidelines, risk factor reduction with self-care instructions and medication management. Flowsheet Row CARDIAC REHAB PHASE II EXERCISE from 01/02/2018 in [facility] CARDIAC REHAB  Date  11/23/17  Educator  RN  Instruction Review Code  2- Demonstrated Understanding       Nutrition I class: Heart Healthy Eating:  -Group instruction  provided by PowerPoint slides, verbal discussion, and written materials to support subject matter. The instructor gives an explanation and review of the Therapeutic Lifestyle Changes diet recommendations, which includes a discussion on lipid goals, dietary fat, sodium, fiber, plant stanol/sterol esters, sugar, and the components of a well-balanced, healthy diet.   Nutrition II class: Lifestyle Skills:  -Group instruction provided by PowerPoint slides, verbal discussion, and written materials to support subject matter. The instructor gives an explanation and review of label reading, grocery shopping for heart health, heart healthy recipe modifications, and ways to make healthier choices when eating out.   Diabetes Question & Answer:  -Group instruction provided by PowerPoint slides, verbal discussion, and written materials to support subject matter. The instructor gives an explanation and review of diabetes co-morbidities, pre- and post-prandial blood glucose goals, pre-exercise blood glucose goals, signs, symptoms, and treatment of hypoglycemia and hyperglycemia, and foot care basics.   Diabetes Blitz:  -Group instruction provided by PowerPoint slides, verbal discussion, and written materials to support subject matter. The instructor gives an explanation and review of the physiology behind type 1 and type 2 diabetes, diabetes medications and rational behind using different medications, pre- and post-prandial blood glucose recommendations and Hemoglobin A1c goals, diabetes diet, and exercise including blood glucose guidelines for exercising safely.    Portion Distortion:  -Group instruction provided by PowerPoint slides, verbal discussion, written materials, and food models to support subject matter. The instructor gives an explanation of serving size versus portion size, changes in portions sizes over the last 20 years, and what consists of a serving from each food group.   Stress Management:   -Group instruction provided by verbal instruction, video, and written materials to support subject matter.  Instructors review role of stress in heart disease and how to cope with stress positively.     Exercising on Your Own:  -Group instruction provided by verbal instruction, power point, and written materials to support subject.  Instructors discuss benefits of exercise, components of exercise, frequency and intensity of exercise, and end points for exercise.  Also discuss use of nitroglycerin and activating EMS.  Review options of places to exercise outside of rehab.  Review guidelines for sex with heart disease. Flowsheet Row CARDIAC REHAB PHASE II EXERCISE from 01/02/2018 in [facility] CARDIAC REHAB  Date  11/21/17  Educator  EP  Instruction Review Code  2- Demonstrated Understanding      Cardiac Drugs I:  -Group instruction provided by verbal instruction and written materials to support subject.  Instructor reviews cardiac drug classes: antiplatelets, anticoagulants, beta blockers, and statins.  Instructor discusses reasons, side effects, and lifestyle considerations for each drug class.   Cardiac Drugs II:  -Group instruction provided by verbal instruction and written materials to support subject.  Instructor reviews cardiac drug classes: angiotensin converting enzyme inhibitors (ACE-I), angiotensin II receptor blockers (ARBs), nitrates, and calcium channel blockers.  Instructor discusses reasons, side effects, and lifestyle considerations for each drug class.   Anatomy and Physiology of the Circulatory System:  Group verbal and written instruction and models provide basic cardiac anatomy and physiology, with  the coronary electrical and arterial systems. Review of: AMI, Angina, Valve disease, Heart Failure, Peripheral Artery Disease, Cardiac Arrhythmia, Pacemakers, and the ICD. Flowsheet Row CARDIAC REHAB PHASE II EXERCISE from 01/02/2018 in [facility] CARDIAC REHAB  Date  10/31/17  Educator  RN  Instruction Review Code  2- Demonstrated Understanding      Other Education:  -Group or individual verbal, written, or video instructions that support the educational goals of the cardiac rehab program.   Holiday Eating Survival Tips:  -Group instruction provided by PowerPoint slides, verbal discussion, and written materials to support subject matter. The instructor gives patients tips, tricks, and techniques to help them not only survive but enjoy the holidays despite the onslaught of food that accompanies the holidays.   Knowledge Questionnaire Score: Knowledge Questionnaire Score - 10/11/17 1147    Knowledge Questionnaire Score          Pre Score  22/28           Core Components/Risk Factors/Patient Goals at Admission: Personal Goals and Risk Factors at Admission - 10/11/17 1240    Core Components/Risk Factors/Patient Goals on Admission           Weight Management  Yes;Weight Maintenance;Weight Loss    Intervention  Weight Management: Develop a combined nutrition and exercise program designed to reach desired caloric intake, while maintaining appropriate intake of nutrient and fiber, sodium and fats, and appropriate energy expenditure required for the weight goal.;Weight Management: Provide education and appropriate resources to help participant work on and attain dietary goals.    Admit Weight  177 lb 0.5 oz (80.3 kg)    Goal Weight: Short Term  170 lb (77.1 kg)    Goal Weight: Long Term  165 lb (74.8 kg)    Expected Outcomes  Short Term: Continue to assess and modify interventions until short term weight is achieved;Long Term: Adherence to nutrition and physical activity/exercise program aimed toward attainment of established weight goal;Weight Maintenance: Understanding of the daily nutrition guidelines, which includes 25-35% calories from fat, 7% or less cal from saturated fats, less than 200mg  cholesterol, less than 1.5gm  of sodium, & 5 or more servings of fruits and vegetables daily;Weight Loss: Understanding of general recommendations for a balanced deficit meal plan, which promotes 1-2 lb weight loss per week and includes a negative energy balance of [phone] kcal/d;Understanding recommendations for meals to include 15-35% energy as protein, 25-35% energy from fat, 35-60% energy from carbohydrates, less than 200mg  of dietary cholesterol, 20-35 gm of total fiber daily;Understanding of distribution of calorie intake throughout the day with the consumption of 4-5 meals/snacks    Hypertension  Yes    Intervention  Provide education on lifestyle modifcations including regular physical activity/exercise, weight management, moderate sodium restriction and increased consumption of fresh fruit, vegetables, and low fat dairy, alcohol moderation, and smoking cessation.;Monitor prescription use compliance.    Expected Outcomes  Short Term: Continued assessment and intervention until BP is < 140/5mm HG in hypertensive participants. < 130/95mm HG in hypertensive participants with diabetes, heart failure or chronic kidney disease.;Long Term: Maintenance of blood pressure at goal levels.    Lipids  Yes    Intervention  Provide education and support for participant on nutrition & aerobic/resistive exercise along with prescribed medications to achieve LDL 70mg , HDL >40mg .    Expected Outcomes  Short Term: Participant states understanding of desired cholesterol values and is compliant with medications prescribed. Participant is following exercise prescription and nutrition guidelines.;Long Term: Cholesterol controlled  with medications as prescribed, with individualized exercise RX and with personalized nutrition plan. Value goals: LDL < 70mg , HDL > 40 mg.           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 Chia would like to increase his stamina to go back to work.  Jshawn has multiple CAD RFs and is willing to participate in exercise. Demetrice would like to increase his stamina to go back to work.  Ji has multiple CAD RFs and is willing to participate in exercise. Rondo would like to increase his stamina to go back to work.  pt is walking at home.   Markeese has multiple CAD RFs and is willing to participate in exercise. Alesandro would like to increase his stamina to go back to work.  pt is walking at home.    Expected Outcomes  Pt will participate in CR exercise, nutrition, and lifestyle modification opportunities.   Pt will participate in CR exercise, nutrition, and lifestyle modification opportunities.   Pt will participate in CR exercise, nutrition, and lifestyle modification opportunities.   Pt will participate in CR exercise, nutrition, and lifestyle modification opportunities.           Core Components/Risk Factors/Patient Goals at Discharge (Final Review):  Goals and Risk Factor Review - 01/03/18 1513    Core Components/Risk Factors/Patient Goals Review          Personal Goals Review  Weight Management/Obesity;Lipids;Hypertension;Diabetes    Review  Yonathan has multiple CAD RFs and is willing to participate in exercise. Dionne would like to increase his stamina to go back to work.  pt is walking at home.     Expected Outcomes  Pt will participate in CR exercise, nutrition, and lifestyle modification opportunities.            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  He tolerated it well.   30 day ITP reveiw.  pt with good attendance and participation. P pt feels comfortable communicating with staff and peers without interpreter services. t interpreter will come PRN at pt request.   30 day ITP reveiw.  pt with good attendance and participation. pt demonstrates eagerness to participate in CR program.   30 day ITP reveiw. pt demonstrates eagerness to participate in CR program. pt recent absences from visiting his sick mother       Comments:

## 2018-01-04 ENCOUNTER — Encounter (HOSPITAL_COMMUNITY): Payer: BLUE CROSS/BLUE SHIELD

## 2018-01-04 ENCOUNTER — Encounter (HOSPITAL_COMMUNITY)
Admission: RE | Admit: 2018-01-04 | Discharge: 2018-01-04 | Disposition: A | Discharge status: Home / Self Care | Payer: BLUE CROSS/BLUE SHIELD | Source: Ambulatory Visit | Attending: Cardiology | Admitting: Cardiology

## 2018-01-04 DIAGNOSIS — Z951 Presence of aortocoronary bypass graft: Secondary | ICD-10-CM

## 2018-01-04 DIAGNOSIS — I214 Non-ST elevation (NSTEMI) myocardial infarction: Secondary | ICD-10-CM

## 2018-01-07 ENCOUNTER — Encounter (HOSPITAL_COMMUNITY): Payer: BLUE CROSS/BLUE SHIELD

## 2018-01-07 ENCOUNTER — Encounter (HOSPITAL_COMMUNITY)
Admission: RE | Admit: 2018-01-07 | Discharge: 2018-01-07 | Disposition: A | Discharge status: Home / Self Care | Payer: BLUE CROSS/BLUE SHIELD | Source: Ambulatory Visit | Attending: Cardiology | Admitting: Cardiology

## 2018-01-07 VITALS — Ht 67.0 in | Wt 173.1 lb

## 2018-01-07 DIAGNOSIS — I214 Non-ST elevation (NSTEMI) myocardial infarction: Secondary | ICD-10-CM

## 2018-01-07 DIAGNOSIS — Z951 Presence of aortocoronary bypass graft: Secondary | ICD-10-CM | POA: Diagnosis not present

## 2018-01-09 ENCOUNTER — Encounter (HOSPITAL_COMMUNITY)
Admission: RE | Admit: 2018-01-09 | Discharge: 2018-01-09 | Disposition: A | Discharge status: Home / Self Care | Payer: BLUE CROSS/BLUE SHIELD | Source: Ambulatory Visit | Attending: Cardiology | Admitting: Cardiology

## 2018-01-09 ENCOUNTER — Encounter (HOSPITAL_COMMUNITY): Payer: BLUE CROSS/BLUE SHIELD

## 2018-01-09 DIAGNOSIS — I214 Non-ST elevation (NSTEMI) myocardial infarction: Secondary | ICD-10-CM

## 2018-01-09 DIAGNOSIS — Z951 Presence of aortocoronary bypass graft: Secondary | ICD-10-CM

## 2018-01-11 ENCOUNTER — Encounter (HOSPITAL_COMMUNITY): Payer: BLUE CROSS/BLUE SHIELD

## 2018-01-11 ENCOUNTER — Encounter (HOSPITAL_COMMUNITY)
Admission: RE | Admit: 2018-01-11 | Discharge: 2018-01-11 | Disposition: A | Discharge status: Home / Self Care | Payer: BLUE CROSS/BLUE SHIELD | Source: Ambulatory Visit | Attending: Cardiology | Admitting: Cardiology

## 2018-01-11 DIAGNOSIS — Z951 Presence of aortocoronary bypass graft: Secondary | ICD-10-CM | POA: Diagnosis not present

## 2018-01-11 DIAGNOSIS — I214 Non-ST elevation (NSTEMI) myocardial infarction: Secondary | ICD-10-CM

## 2018-01-14 ENCOUNTER — Encounter (HOSPITAL_COMMUNITY)
Admission: RE | Admit: 2018-01-14 | Discharge: 2018-01-14 | Disposition: A | Discharge status: Home / Self Care | Payer: BLUE CROSS/BLUE SHIELD | Source: Ambulatory Visit | Attending: Cardiology | Admitting: Cardiology

## 2018-01-14 ENCOUNTER — Encounter (HOSPITAL_COMMUNITY): Payer: BLUE CROSS/BLUE SHIELD

## 2018-01-14 DIAGNOSIS — Z951 Presence of aortocoronary bypass graft: Secondary | ICD-10-CM

## 2018-01-14 DIAGNOSIS — I214 Non-ST elevation (NSTEMI) myocardial infarction: Secondary | ICD-10-CM

## 2018-01-16 ENCOUNTER — Encounter (HOSPITAL_COMMUNITY): Payer: BLUE CROSS/BLUE SHIELD

## 2018-01-16 ENCOUNTER — Encounter (HOSPITAL_COMMUNITY)
Admission: RE | Admit: 2018-01-16 | Discharge: 2018-01-16 | Disposition: A | Discharge status: Home / Self Care | Payer: BLUE CROSS/BLUE SHIELD | Source: Ambulatory Visit | Attending: Cardiology | Admitting: Cardiology

## 2018-01-16 DIAGNOSIS — Z951 Presence of aortocoronary bypass graft: Secondary | ICD-10-CM | POA: Diagnosis not present

## 2018-01-16 DIAGNOSIS — I214 Non-ST elevation (NSTEMI) myocardial infarction: Secondary | ICD-10-CM

## 2018-01-18 ENCOUNTER — Telehealth: Payer: Self-pay | Admitting: Cardiology

## 2018-01-18 ENCOUNTER — Encounter (HOSPITAL_COMMUNITY)
Admission: RE | Admit: 2018-01-18 | Discharge: 2018-01-18 | Disposition: A | Discharge status: Home / Self Care | Payer: BLUE CROSS/BLUE SHIELD | Source: Ambulatory Visit | Attending: Cardiology | Admitting: Cardiology

## 2018-01-18 ENCOUNTER — Encounter (HOSPITAL_COMMUNITY): Payer: Self-pay

## 2018-01-18 DIAGNOSIS — Z951 Presence of aortocoronary bypass graft: Secondary | ICD-10-CM

## 2018-01-18 DIAGNOSIS — I214 Non-ST elevation (NSTEMI) myocardial infarction: Secondary | ICD-10-CM

## 2018-01-18 NOTE — Telephone Encounter (Signed)
New message  Per Mechele Claude patient needs a note to return to work since the patient has finished cardiac rehab. His met avg is 5 to 6. Patient would like address mailed to address on file.

## 2018-01-20 NOTE — Telephone Encounter (Signed)
Patient has completed cardiac rehab and is stable to return to work

## 2018-01-23 NOTE — Telephone Encounter (Signed)
Spoke with patient's daughter per DPR, the letter was completed and placed in the mail.

## 2018-01-31 NOTE — Progress Notes (Signed)
Discharge Progress Report  Patient Details  Name: [name] MRN: [id number] Date of Birth: [birth year] Referring Provider:   Flowsheet Row CARDIAC REHAB PHASE II ORIENTATION from 10/11/2017 in [facility]  Referring Provider  Fransico Him MD       Number of Visits: 48   Reason for Discharge:  Patient has met program and personal goals.  Smoking History:  Social History   Tobacco Use  Smoking Status Former Smoker  . Last attempt to quit: 07/22/1998  . Years since quitting: 19.5  Smokeless Tobacco Never Used    Diagnosis:  NSTEMI (non-ST elevated myocardial infarction) ([place])  S/P CABG x 5  ADL [facility]:   Initial Exercise Prescription: Initial Exercise Prescription - 10/11/17 1200    Date of Initial Exercise RX and Referring Provider          Date  10/11/17    Referring Provider  Fransico Him MD    Expected Discharge Date  01/13/18        Bike          Level  0.8    Minutes  10    METs  2.89        NuStep          Level  3    SPM  80    Minutes  10    METs  2        Track          Laps  11    Minutes  10    METs  2.92        Prescription Details          Frequency (times per week)  3    Duration  Progress to 30 minutes of continuous aerobic without signs/symptoms of physical distress        Intensity          THRR 40-80% of Max Heartrate  66-132    Ratings of Perceived Exertion  11-13    Perceived Dyspnea  0-4        Progression          Progression  Continue to progress workloads to maintain intensity without signs/symptoms of physical distress.        Resistance Training          Training Prescription  Yes    Weight  3lbs    Reps  10-15           Discharge Exercise Prescription (Final Exercise Prescription Changes): Exercise Prescription Changes - 01/11/18 1106    Response to Exercise          Blood Pressure (Admit)  110/68    Blood Pressure (Exercise)  120/70    Blood Pressure (Exit)   104/60    Heart Rate (Admit)  67 bpm    Heart Rate (Exercise)  108 bpm    Heart Rate (Exit)  77 bpm    Rating of Perceived Exertion (Exercise)  13    Perceived Dyspnea (Exercise)  0    Symptoms  None     Comments  None    Duration  Continue with 30 min of aerobic exercise without signs/symptoms of physical distress.    Intensity  THRR unchanged        Progression          Progression  Continue to progress workloads to maintain intensity without signs/symptoms of physical distress.    Average METs  5.1  Resistance Training          Training Prescription  Yes    Weight  9    Reps  10-15    Time  10 Minutes        Interval Training          Interval Training  No        Bike          Level  2    Minutes  10    METs  5.81        NuStep          Level  5    SPM  100    Minutes  10    METs  5.4        Track          Laps  20    Minutes  10    METs  3.96        Home Exercise Plan          Plans to continue exercise at  Home (comment)   walking   Frequency  Add 3 additional days to program exercise sessions.    Initial Home Exercises Provided  10/26/17           Functional Capacity: 6 Minute Walk    6 Minute Walk    Row Name 10/11/17 1239 01/09/18 1524   Phase  Initial  Discharge   Distance  1647 feet  1990 feet   Distance % Change  no documentation  17.24 %   Distance Feet Change  no documentation  343 ft   Walk Time  6 minutes  6 minutes   # of Rest Breaks  0  0   MPH  3.1  3.77   METS  4.1  4.92   RPE  11  11   Perceived Dyspnea   no documentation  0   VO2 Peak  14.5  17.21   Symptoms  No  No   Resting HR  67 bpm  68 bpm   Resting BP  112/68  102/70   Resting Oxygen Saturation   99 %  no documentation   Exercise Oxygen Saturation  during 6 min walk  100 %  no documentation   Max Ex. HR  89 bpm  108 bpm   Max Ex. BP  120/60  118/60   2 Minute Post BP  104/60  102/72          Psychological, QOL, Others - Outcomes: PHQ  2/9: Depression screen [facility] 2/9 01/18/2018 10/15/2017  Decreased Interest 0 0  Down, Depressed, Hopeless 0 0  PHQ - 2 Score 0 0    Quality of Life: Quality of Life - 01/16/18 1455    Quality of Life          Select  Quality of Life        Quality of Life Scores          Health/Function Pre  29.2 %    Health/Function Post  30 %    Health/Function % Change  2.74 %    Socioeconomic Pre  18.75 %    Socioeconomic Post  28.5 %    Socioeconomic % Change   52 %    Psych/Spiritual Pre  30 %    Psych/Spiritual Post  30 %    Psych/Spiritual % Change  0 %    Family Pre  30 %    Family Post  30 %  Family % Change  0 %    GLOBAL Pre  27.09 %    GLOBAL Post  29.66 %    GLOBAL % Change  9.49 %           Personal Goals: Goals established at orientation with interventions provided to work toward goal. Personal Goals and Risk Factors at Admission - 10/11/17 1240    Core Components/Risk Factors/Patient Goals on Admission           Weight Management  Yes;Weight Maintenance;Weight Loss    Intervention  Weight Management: Develop a combined nutrition and exercise program designed to reach desired caloric intake, while maintaining appropriate intake of nutrient and fiber, sodium and fats, and appropriate energy expenditure required for the weight goal.;Weight Management: Provide education and appropriate resources to help participant work on and attain dietary goals.    Admit Weight  177 lb 0.5 oz (80.3 kg)    Goal Weight: Short Term  170 lb (77.1 kg)    Goal Weight: Long Term  165 lb (74.8 kg)    Expected Outcomes  Short Term: Continue to assess and modify interventions until short term weight is achieved;Long Term: Adherence to nutrition and physical activity/exercise program aimed toward attainment of established weight goal;Weight Maintenance: Understanding of the daily nutrition guidelines, which includes 25-35% calories from fat, 7% or less cal from saturated fats, less than 266m  cholesterol, less than 1.5gm of sodium, & 5 or more servings of fruits and vegetables daily;Weight Loss: Understanding of general recommendations for a balanced deficit meal plan, which promotes 1-2 lb weight loss per week and includes a negative energy balance of [phone] kcal/d;Understanding recommendations for meals to include 15-35% energy as protein, 25-35% energy from fat, 35-60% energy from carbohydrates, less than 2018mof dietary cholesterol, 20-35 gm of total fiber daily;Understanding of distribution of calorie intake throughout the day with the consumption of 4-5 meals/snacks    Hypertension  Yes    Intervention  Provide education on lifestyle modifcations including regular physical activity/exercise, weight management, moderate sodium restriction and increased consumption of fresh fruit, vegetables, and low fat dairy, alcohol moderation, and smoking cessation.;Monitor prescription use compliance.    Expected Outcomes  Short Term: Continued assessment and intervention until BP is < 140/9011mG in hypertensive participants. < 130/50m57m in hypertensive participants with diabetes, heart failure or chronic kidney disease.;Long Term: Maintenance of blood pressure at goal levels.    Lipids  Yes    Intervention  Provide education and support for participant on nutrition & aerobic/resistive exercise along with prescribed medications to achieve LDL <70mg46mL >40mg.41mExpected Outcomes  Short Term: Participant states understanding of desired cholesterol values and is compliant with medications prescribed. Participant is following exercise prescription and nutrition guidelines.;Long Term: Cholesterol controlled with medications as prescribed, with individualized exercise RX and with personalized nutrition plan. Value goals: LDL < 70mg, 45m> 40 mg.            Personal Goals Discharge: Goals and Risk Factor Review    Core Components/Risk Factors/Patient Goals Review    Row Name 10/15/17 1045  11/02/17 1224 12/05/17 1707 01/03/18 1513 01/18/18 0916   Personal Goals Review  Weight Management/Obesity;Lipids;Hypertension;Diabetes  Weight Management/Obesity;Lipids;Hypertension;Diabetes  Weight Management/Obesity;Lipids;Hypertension;Diabetes  Weight Management/Obesity;Lipids;Hypertension;Diabetes  Weight Management/Obesity;Lipids;Hypertension;Diabetes   Review  Fermin has multiple CAD RFs and is willing to participate in exercise. Diesel would like to increase his stamina to go back to work.  Jaylan has multiple CAD RFs and is willing to  participate in exercise. Canon would like to increase his stamina to go back to work.  Millan has multiple CAD RFs and is willing to participate in exercise. Cornelius would like to increase his stamina to go back to work.  pt is walking at home.   Dontrez has multiple CAD RFs and is willing to participate in exercise. Rob would like to increase his stamina to go back to work.  pt is walking at home.   Dejon has multiple CAD RFs and is willing to participate in exercise. 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    pt is walking 30 minutes at home and plans to continue for his home exercise.    Expected Outcomes  Pt will participate in CR exercise, nutrition, and lifestyle modification opportunities.   Pt will participate in CR exercise, nutrition, and lifestyle modification opportunities.   Pt will participate in CR exercise, nutrition, and lifestyle modification opportunities.   Pt will participate in CR exercise, nutrition, and lifestyle modification opportunities.   Pt will participate in exercise, nutrition, and lifestyle modification opportunities on his own in the community.           Exercise Goals and Review: Exercise Goals    Exercise Goals    Row Name 10/11/17 0918 10/11/17 1240   Increase Physical Activity  Yes  no documentation   Intervention  Provide advice, education, support and counseling about physical  activity/exercise needs.;Develop an individualized exercise prescription for aerobic and resistive training based on initial evaluation findings, risk stratification, comorbidities and participant's personal goals.  no documentation   Expected Outcomes  Short Term: Attend rehab on a regular basis to increase amount of physical activity.;Long Term: Add in home exercise to make exercise part of routine and to increase amount of physical activity.;Long Term: Exercising regularly at least 3-5 days a week.  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  no documentation   Expected Outcomes  Long Term: Improve cardiorespiratory fitness, muscular endurance and strength as measured by increased METs and functional capacity (6MWT);Short Term: Perform resistance training exercises routinely during rehab and add in resistance training at home;Short Term: Increase workloads from initial exercise prescription for resistance, speed, and METs.  no documentation   Able to understand and use rate of perceived exertion (RPE) scale  Yes  no documentation   Intervention  Provide education and explanation on how to use RPE scale  no documentation   Expected Outcomes  Short Term: Able to use RPE daily in rehab to express subjective intensity level;Long Term:  Able to use RPE to guide intensity level when exercising independently  no documentation   Knowledge and understanding of Target Heart Rate Range (THRR)  Yes  no documentation   Intervention  Provide education and explanation of THRR including how the numbers were predicted and  where they are located for reference  no documentation   Expected Outcomes  Short Term: Able to state/look up THRR;Long Term: Able to use THRR to govern intensity when exercising independently;Short Term: Able to use daily as guideline for intensity in rehab  no documentation   Able to check pulse independently  Yes  no documentation  Intervention  Provide education and demonstration on how to check pulse in carotid and radial arteries.;Review the importance of being able to check your own pulse for safety during independent exercise  no documentation   Expected Outcomes  Short Term: Able to explain why pulse checking is important during independent exercise;Long Term: Able to check pulse independently and accurately  no documentation   Understanding of Exercise Prescription  Yes  no documentation   Intervention  Provide education, explanation, and written materials on patient's individual exercise prescription  no documentation   Expected Outcomes  Long Term: Able to explain home exercise prescription to exercise independently;Short Term: Able to explain program exercise prescription  no documentation          Nutrition & Weight - Outcomes: Pre Biometrics - 10/11/17 1230    Pre Biometrics          Height  _0  (1.702 m)    Weight  80.3 kg    Waist Circumference  39.5 inches    Hip Circumference  39.5 inches    Waist to Hip Ratio  1 %    BMI (Calculated)  27.72    Triceps Skinfold  25 mm    % Body Fat  29.1 %    Grip Strength  45 kg    Flexibility  8 in    Single Leg Stand  30 seconds          Post Biometrics - 01/09/18 1525     Post  Biometrics          Height  _1  (1.702 m)    Weight  78.5 kg    Waist Circumference  37.5 inches    Hip Circumference  38 inches    Waist to Hip Ratio  0.99 %    BMI (Calculated)  27.1    Triceps Skinfold  21 mm    % Body Fat  27.2 %    Grip Strength  45 kg    Flexibility  9 in    Single Leg Stand  35 seconds            Nutrition: Nutrition Therapy & Goals - 01/28/18 1452    Nutrition Therapy          Diet  consistent carbohydrate heart healthy        Personal Nutrition Goals          Nutrition Goal  Pt to identify food quantities necessary to achieve weight loss of 6-24 lbs.        Intervention Plan          Intervention  Prescribe, educate and counsel regarding individualized specific dietary modifications aiming towards targeted core components such as weight, hypertension, lipid management, diabetes, heart failure and other comorbidities.    Expected Outcomes  Short Term Goal: Understand basic principles of dietary content, such as calories, fat, sodium, cholesterol and nutrients.           Nutrition Discharge: Nutrition Assessments - 01/28/18 1453    MEDFICTS Scores          Pre Score  20    Post Score  16    Score Difference  -4           Education Questionnaire Score: Knowledge Questionnaire Score - 01/16/18 1454    Knowledge Questionnaire Score          Pre Score  22/28    Post Score  16/24  Goals reviewed with patient; copy given to patient.

## 2018-02-04 ENCOUNTER — Encounter: Payer: Self-pay | Admitting: Cardiology

## 2018-02-04 ENCOUNTER — Ambulatory Visit (INDEPENDENT_AMBULATORY_CARE_PROVIDER_SITE_OTHER): Payer: BLUE CROSS/BLUE SHIELD | Admitting: Cardiology

## 2018-02-04 ENCOUNTER — Encounter (INDEPENDENT_AMBULATORY_CARE_PROVIDER_SITE_OTHER): Payer: Self-pay

## 2018-02-04 VITALS — BP 106/82 | HR 62 | Ht 67.0 in | Wt 174.6 lb

## 2018-02-04 DIAGNOSIS — I10 Essential (primary) hypertension: Secondary | ICD-10-CM

## 2018-02-04 DIAGNOSIS — E785 Hyperlipidemia, unspecified: Secondary | ICD-10-CM | POA: Diagnosis not present

## 2018-02-04 DIAGNOSIS — I4891 Unspecified atrial fibrillation: Secondary | ICD-10-CM

## 2018-02-04 DIAGNOSIS — I9789 Other postprocedural complications and disorders of the circulatory system, not elsewhere classified: Secondary | ICD-10-CM

## 2018-02-04 DIAGNOSIS — I2510 Atherosclerotic heart disease of native coronary artery without angina pectoris: Secondary | ICD-10-CM

## 2018-02-04 NOTE — Patient Instructions (Signed)
Medication Instructions:  Your physician recommends that you continue on your current medications as directed. Please refer to the Current Medication list given to you today.  If you need a refill on your cardiac medications before your next appointment, please call your pharmacy.   Lab work: If you have labs (blood work) drawn today and your tests are completely normal, you will receive your results only by: Marland Kitchen MyChart Message (if you have MyChart) OR . A paper copy in the mail If you have any lab test that is abnormal or we need to change your treatment, we will call you to review the results.  Follow-Up: At [facility], you and your health needs are our priority.  As part of our continuing mission to provide you with exceptional heart care, we have created designated Provider Care Teams.  These Care Teams include your primary Cardiologist (physician) and Advanced Practice Providers (APPs -  Physician Assistants and Nurse Practitioners) who all work together to provide you with the care you need, when you need it. You will need a follow up appointment in 1 years.  Please call our office 2 months in advance to schedule this appointment.  You may see Armanda Magic, MD or one of the following Advanced Practice Providers on your designated Care Team:   [place], PA-C Ronie Spies, PA-C . Jacolyn Reedy, PA-C .    Plan de alimentacin cardiosaludable (Heart-Healthy Eating Plan) La planificacin de las comidas cardiosaludables incluye lo siguiente:  Limitar las grasas poco saludables.  Aumentar las grasas saludables.  Hacer otros pequeos cambios en la dieta. Es posible que tenga que hablar con el mdico o con un especialista en alimentacin (nutricionista) para crear un plan de alimentacin que sea adecuado para usted. QU TIPOS DE GRASAS DEBO ELEGIR?  Elija las grasas saludables. Estas incluyen aceite de oliva y de canola, semillas de lino, nueces, almendras y  semillas.  Consuma ms grasas omega-3. Estas incluyen salmn, caballa, sardinas, atn, aceite de lino y semillas de lino molidas. Trate de comer pescado al [employer] veces por semana.  Limite el consumo de grasas saturadas, ? las cuales suelen [employer] productos de origen animal, como carnes, Ferrum y crema. ? Las grasas saturadas de origen vegetal incluyen aceite de palma, de palmiste y de coco.  Evite los alimentos con aceites parcialmente hidrogenados. Estos incluyen margarina en barra, algunas margarinas untables, galletas, galletitas y otros productos horneados. Estos contienen grasas trans. QU PAUTAS GENERALES DEBO SEGUIR?  Lea atentamente las etiquetas de los alimentos. Identifique los que contienen grasas trans o altas cantidades de grasas saturadas.  Llene la mitad del plato con verduras y ensaladas de hojas verdes. Coma 4 o 5porciones de verduras Air cabin crew. Una porcin de verduras equivale a lo siguiente: ? 1 taza de verduras de hoja crudas. ?  taza de verduras en trozos crudas o cocidas. ?  taza de jugo de verduras.  Llene un cuarto del plato con cereales integrales. Busque la palabra "integral" en Estate agent de la lista de ingredientes.  Llene un cuarto del plato con alimentos con protenas magras.  Coma 4 o 5porciones de frutas por da. Una porcin de fruta equivale a lo siguiente: ? Una fruta mediana entera. ? taza de fruta disecada. ?  taza de fruta fresca, congelada o enlatada. ? taza de jugo 100% de fruta.  Consuma ms alimentos con fibra soluble, los cuales incluyen [place], brcoli, zanahorias, frijoles, guisantes y [place]. Trate de consumir de 20a 30g de  fibra por da.  Coma ms comidas caseras. Coma menos comida de restaurantes, bufs y comida rpida.  Limite o evite el alcohol.  Limite los alimentos con alto contenido de almidn y International aid/development worker.  Evite las comidas fritas.  Evite frer los alimentos. En cambio, trate de cocinarlos  en el horno, en la plancha o en la parrilla, o hervirlos. Tambin puede reducir las grasas de la siguiente forma: ? Quite la piel de las aves. ? Quite todas las grasas visibles de las carnes. ? Espume la grasa de los guisos, las sopas y las salsas antes de servirlos. ? Cocine al vapor las verduras en agua o caldo.  Baje de peso si es necesario.  Coma 4 o 5porciones de frutos secos, legumbres y semillas por semana: ? Una porcin de frijoles o legumbres secos equivale a taza despus de su coccin. ? Una porcin de frutos secos equivale a 1onzas. ? Una porcin de semillas equivale a onza o una cucharada.  Tal vez deba llevar un registro de la cantidad de sal o sodio que ingiere, especialmente si tiene la presin hipertensin arterial. Hable con el mdico o el nutricionista para obtener ms informacin. QU ALIMENTOS PUEDO COMER? Cereales Panes, incluido el pan francs, blanco, pita, de [place], de pasas de [place], de centeno, de avena e [place]. Tortillas que no estn fritas ni elaboradas con manteca de cerdo ni grasas trans. Panecillos bajos en grasas, incluidos los panes para perros calientes y [place], y los bollitos tipo ingls. Galletas. Muffins. Waffles. Panqueques. Palomitas de maz con bajo contenido calrico. Cereales integrales. Pan sin levadura. Tostada Melba. Pretzels. Palitos de pan. Galletas. Colaciones bajas en grasas. Galletitas Henry Schein, Avery Dennison, las que tienen forma de [place], las [place], el pan cimo, las [place], las que tienen forma de [place] y las de centeno. Arroz y pastas, incluido el arroz integral y las pastas elaboradas con cereales integrales. Verduras Todas las verduras. Frutas Todas las frutas, pero limite el [place]. [place] y [place] fuentes de protenas Carne de res, [place], cerdo y cordero magras sin grasa. Pollo y pavo sin piel. Todos los pescados y Liberty Global. Pato salvaje, conejo, faisn y venado. Claras de huevo o sustitutos del huevo  bajos en colesterol. Porotos, guisantes, lentejas secos y tofu. Semillas y la mayora de los frutos secos. Lcteos Quesos descremados y semidescremados, entre ellos, ricota, queso en hebras y Garment/textile technologist. Leche descremada o al 1% que sea lquida, en polvo o evaporada. Suero de [employer] elaborado con Molson Coors Brewing. Yogur descremado o bajo en grasas. Bebidas Agua mineral. Bebidas gaseosas dietticas. Dulces y postres Sorbetes y helados de fruta. [place], [place], [place], jalea y [place]. Merengues y gelatinas. Caramelos de Science Applications International, como caramelos duros, caramelos de goma, pastillas de goma, mentas, malvaviscos y pequeas cantidades de chocolate amargo. Torta ngel. Coma todos los dulces y postres con moderacin. Grasas y Writer no hidrogenadas (sin grasas trans). Aceites vegetales, incluido el de soja, ssamo, girasol, [place], man, crtamo, maz, canola y semillas de algodn. Alios para ensalada o mayonesa elaborados con aceite vegetal. Limite las grasas y los aceites agregados que [place] para Water quality scientist, Development worker, community, preparar ensaladas y las cremas untables. Otros [place] en polvo. T o caf. Todos los alios y condimentos. Los artculos mencionados arriba pueden no ser Raytheon de las bebidas o los alimentos recomendados. Comunquese con el nutricionista para conocer ms opciones. QU ALIMENTOS NO SE RECOMIENDAN? Cereales Panes elaborados con grasas saturadas o trans, aceites o Eastman Kodak. Croissants. Panecillos de mantequilla. Panes  de queso. Panecillos dulces. Rosquillas. Palomitas de maz con mantequilla. Fideos chow mein. Galletitas con FedEx de grasas, como las que contienen queso o Fedora. Carnes y [address] fuentes de protenas Carnes grasas, como perros calientes, [place] de res, [address], puntas de [place], [place], asado de [place] o [place], y carnero. Fiambres altos en grasas, como salame y [place]. Caviar. Pato y ganso domsticos. Vsceras,  como riones, hgado, [place], y Programmer, multimedia. Lcteos Crema, crema agria, queso crema y [place] cottage con crema. Quesos elaborados con Eastman Kodak, incluido el queso [place] (bleu), [place], [place], [place], [place], [place], suizo, cheddar, camembert y [place]. Leche entera o al 2% que sea [place], evaporada o condensada. Suero de Liberty Global. Salsa de crema o queso alta en grasas. Yogur elaborado con Eastman Kodak. Bebidas Refrescos regulares y jugos con agregado de International aid/development worker. Dulces y [employer]. Pudin. Galletas. Tortas que no sean la torta ngel. Caramelos que contengan chocolate con leche o chocolate blanco, grasa hidrogenada, mantequilla, coco o ingredientes desconocidos. Almbares con mantequilla. Helados o bebidas elaboradas con helado con alto contenido de grasas. Grasas y [address] que [place], [place]the [place] of 60 deg S) de carne o materia grasa. Manteca de cacao, aceites hidrogenados, aceite de palma, aceite de coco, aceite de palmiste. A menudo, estos se encuentran en los productos horneados, los caramelos, las comidas fritas, las cremas no lcteas y las coberturas batidas. Grasas y [address] grasas slidas, incluida la grasa del tocino, el cerdo [place], la [place] de cerdo y Civil engineer, contracting. Sustitutos de crema no lctea, como cremas para caf y sustitutos de crema agria. Alios para ensaladas elaborados con aceites desconocidos, queso o crema agria. Los artculos mencionados arriba pueden no ser Raytheon de las bebidas y los alimentos que se Theatre stage manager. Comunquese con el nutricionista para obtener ms informacin. Esta informacin no tiene Theme park manager el consejo del mdico. Asegrese de hacerle al mdico cualquier pregunta que tenga. Document Released: 09/19/2011 Document Revised: 04/10/2014 Document Reviewed: 09/11/2013 Elsevier Interactive Patient Education  [employer].

## 2018-02-04 NOTE — Progress Notes (Signed)
Cardiology Office Note:    Date:  02/04/2018   ID:  [name], DOB Jul 10, 1960, MRN [id number]  PCP:  Sandre Kitty, PA-C  Cardiologist:  Armanda Magic, MD    Referring MD: Sandre Kitty, PA-C   Chief Complaint  Patient presents with  . Coronary Artery Disease  . Hypertension  . Hyperlipidemia    History of Present Illness:    [name] is a 57 y.o. male with a hx of NSTEMI found to have severe three-vessel coronary artery disease and underwent CABG x5 with LIMA to the LAD, SVG to diagonal, SVG to OM1 and OM 2, SVG to RCA 07/24/2017.  He also had postop atrial fibrillation and was placed on amiodarone and apixaban for 6 to 8 weeks. Patient also has hyperlipidemia and diabetes.   Event monitor in June 2018 showed no recurrent atrial fibrillation.  His amiodarone and apixaban were stopped.  he is here today for followup and is doing well.  He denies any chest pain or pressure, SOB, DOE, PND, orthopnea, LE edema, dizziness, palpitations or syncope. He is compliant with his meds and is tolerating meds with no SE.    Past Medical History:  Diagnosis Date  . CAD (coronary artery disease), native coronary artery 09/20/2017  . Diabetes mellitus without complication (HCC)   . Hyperlipidemia   . Hypertension     Past Surgical History:  Procedure Laterality Date  . CORONARY ARTERY BYPASS GRAFT N/A 07/23/2017   Procedure: CORONARY ARTERY BYPASS GRAFTING (CABG) x Five , using left internal mammary artery and right leg greater saphenous vein harvested endoscopically;  Surgeon: [name], MD;  Location: [facility] OR;  Service: Open Heart Surgery;  Laterality: N/A;  . LEFT HEART CATH AND CORONARY ANGIOGRAPHY N/A 07/22/2017   Procedure: LEFT HEART CATH AND CORONARY ANGIOGRAPHY;  Surgeon: Swaziland, Peter M, MD;  Location: [facility] INVASIVE CV LAB;  Service: Cardiovascular;  Laterality: N/A;  . TEE WITHOUT CARDIOVERSION N/A 07/23/2017   Procedure: TRANSESOPHAGEAL ECHOCARDIOGRAM (TEE);  Surgeon:  [name], MD;  Location: [facility] OR;  Service: Open Heart Surgery;  Laterality: N/A;    Current Medications: Current Meds  Medication Sig  . acetaminophen (TYLENOL) 500 MG tablet Take 1 tablet (500 mg total) by mouth every 6 (six) hours as needed.  Marland Kitchen aspirin EC 81 MG EC tablet Take 1 tablet (81 mg total) by mouth daily.  Marland Kitchen atorvastatin (LIPITOR) 80 MG tablet Take 1 tablet (80 mg total) by mouth daily at 6 PM.  . losartan-hydrochlorothiazide (HYZAAR) 100-25 MG tablet Take 1 tablet by mouth daily.  . metFORMIN (GLUCOPHAGE) 1000 MG tablet Take 1,000 mg by mouth 2 (two) times daily.  . metoprolol tartrate (LOPRESSOR) 50 MG tablet Take 1 tablet (50 mg total) by mouth 2 (two) times daily.     Allergies:   Tape   Social History   Socioeconomic History  . Marital status: Married    Spouse name: Not on file  . Number of children: Not on file  . Years of education: Not on file  . Highest education level: Not on file  Occupational History  . Not on file  Social Needs  . Financial resource strain: Not on file  . Food insecurity:    Worry: Not on file    Inability: Not on file  . Transportation needs:    Medical: Not on file    Non-medical: Not on file  Tobacco Use  . Smoking status: Former Smoker    Last attempt  to quit: 07/22/1998    Years since quitting: 19.5  . Smokeless tobacco: Never Used  Substance and Sexual Activity  . Alcohol use: Yes  . Drug use: No  . Sexual activity: Not on file  Lifestyle  . Physical activity:    Days per week: Not on file    Minutes per session: Not on file  . Stress: Not on file  Relationships  . Social connections:    Talks on phone: Not on file    Gets together: Not on file    Attends religious service: Not on file    Active member of club or organization: Not on file    Attends meetings of clubs or organizations: Not on file    Relationship status: Not on file  Other Topics Concern  . Not on file  Social History Narrative  . Not on  file     Family History: The patient's family history includes Heart disease in his mother.  ROS:   Please see the history of present illness.    ROS  All other systems reviewed and negative.   EKGs/Labs/Other Studies Reviewed:    The following studies were reviewed today: Event monitor  EKG:  EKG is not ordered today.    Recent Labs: 07/21/2017: ALT 33 07/29/2017: Magnesium 2.1; TSH 5.572 08/06/2017: BUN 18; Creatinine, Ser 1.22; Hemoglobin 11.5; Platelets 702; Potassium 3.8; Sodium 131   Recent Lipid Panel    Component Value Date/Time   CHOL 103 07/22/2017 0303   TRIG 60 07/22/2017 0303   HDL 30 (L) 07/22/2017 0303   CHOLHDL 3.4 07/22/2017 0303   VLDL 12 07/22/2017 0303   LDLCALC 61 07/22/2017 0303    Physical Exam:    VS:  BP 106/82   Pulse 62   Ht 5\' 7"  (1.702 m)   Wt 174 lb 9.6 oz (79.2 kg)   SpO2 99%   BMI 27.35 kg/m     Wt Readings from Last 3 Encounters:  02/04/18 174 lb 9.6 oz (79.2 kg)  01/09/18 173 lb 1 oz (78.5 kg)  10/18/17 174 lb 12.8 oz (79.3 kg)     GEN:  Well nourished, well developed in no acute distress HEENT: Normal NECK: No JVD; No carotid bruits LYMPHATICS: No lymphadenopathy CARDIAC: RRR, no murmurs, rubs, gallops RESPIRATORY:  Clear to auscultation without rales, wheezing or rhonchi  ABDOMEN: Soft, non-tender, non-distended MUSCULOSKELETAL:  No edema; No deformity  SKIN: Warm and dry NEUROLOGIC:  Alert and oriented x 3 PSYCHIATRIC:  Normal affect   ASSESSMENT:    1. Coronary artery disease involving native coronary artery of native heart without angina pectoris   2. Essential hypertension   3. Postoperative atrial fibrillation (HCC)   4. Hyperlipidemia LDL goal <70    PLAN:    In order of problems listed above:  1.  ASCAD - s/p  NSTEMI with Cath showing severe three-vessel coronary artery disease s/p CABG x5 with LIMA to the LAD, SVG to diagonal, SVG to OM1 and OM 2, SVG to RCA 07/24/2017.  2.  HTN -BP is well controlled on  exam today.  His last creatinine was 1.22 on 08/06/2017 and potassium 3.8.  3.  Post op atrial fibrillation  -event monitor done in June showed no recurrent A. fib and his amiodarone and apixaban was stopped.  4.  Hyperlipidemia -LDL goal is less than 70.  LDL was 61 on 07/22/2017 and ALT was 33.  He will continue on atorvastatin 80 mg daily.   I  have encouraged him to restrict fish as well as okay.  I will send him home with a heart diet Spanish today.  Medication Adjustments/Labs and Tests Ordered: Current medicines are reviewed at length with the patient today.  Concerns regarding medicines are outlined above.  No orders of the defined types were placed in this encounter.  No orders of the defined types were placed in this encounter.   Signed, Armanda Magic, MD  02/04/2018 11:02 AM     [facility]

## 2018-07-03 ENCOUNTER — Telehealth: Payer: Self-pay | Admitting: Cardiology

## 2018-07-03 NOTE — Telephone Encounter (Signed)
Dtr calling in for father, who speaks Spanish (pt in background answering questions while on this call) Pt reporting chest pain only when he breaths in. 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  He usually uses a machine for the shoveling but they had to manually shovel yesterday. Reports muscle pain in chest/arms. Denies chest pain feels same/similar to past chest pain/CABG. Denies SOB, weight gain, dizziness/syncope, nausea. Reviewed with DOD, Dr. Excell Seltzer.  Order to take OTC anti-inflammatory. Advised dtr who is agreeable to plan. They will call office with further question/concerns.  They will call office/go to ED if chest pain changes/worsens and/or changes in nature

## 2018-07-03 NOTE — Telephone Encounter (Signed)
° °  1. Are you having CP right now? YES  2. Are you experiencing any other symptoms (ex. SOB, nausea, vomiting, sweating)? NO  3. How long have you been experiencing CP? STARTED THIS MORNING  4. Is your CP continuous or coming and going? CONTINOUS  5. Have you taken Nitroglycerin? NO ?

## 2019-02-12 ENCOUNTER — Ambulatory Visit: Payer: BLUE CROSS/BLUE SHIELD | Admitting: Cardiology

## 2019-02-12 ENCOUNTER — Other Ambulatory Visit: Payer: Self-pay

## 2019-02-12 ENCOUNTER — Encounter: Payer: Self-pay | Admitting: Cardiology

## 2019-02-12 ENCOUNTER — Ambulatory Visit (INDEPENDENT_AMBULATORY_CARE_PROVIDER_SITE_OTHER): Payer: Self-pay | Admitting: Cardiology

## 2019-02-12 VITALS — BP 130/76 | HR 63 | Ht 67.0 in | Wt 166.8 lb

## 2019-02-12 DIAGNOSIS — I9789 Other postprocedural complications and disorders of the circulatory system, not elsewhere classified: Secondary | ICD-10-CM

## 2019-02-12 DIAGNOSIS — I4891 Unspecified atrial fibrillation: Secondary | ICD-10-CM

## 2019-02-12 DIAGNOSIS — I2510 Atherosclerotic heart disease of native coronary artery without angina pectoris: Secondary | ICD-10-CM

## 2019-02-12 DIAGNOSIS — I10 Essential (primary) hypertension: Secondary | ICD-10-CM

## 2019-02-12 DIAGNOSIS — E785 Hyperlipidemia, unspecified: Secondary | ICD-10-CM

## 2019-02-12 NOTE — Progress Notes (Signed)
Cardiology Office Note:    Date:  02/12/2019   ID:  [name], DOB 10/10/1960, MRN [id number]  PCP:  [name], [name], PA-C  Cardiologist:  [name], MD    Referring MD: [name], [name], PA-C   Chief Complaint  Patient presents with  . Coronary Artery Disease  . Hypertension  . Atrial Fibrillation  . Hyperlipidemia    History of Present Illness:    [name] is a 58 y.o. male with a hx of NSTEMI found to have severe three-vessel coronary artery disease and underwent CABG x5 with LIMA to the LAD, SVG to diagonal, SVG to OM1 and OM 2, SVG to RCA 07/24/2017.He also had postop atrial fibrillation and was placed on amiodarone and apixaban for 6 to 8 weeks. Patient also has hyperlipidemia and diabetes.  Event monitor in June 2018 showed no recurrent atrial fibrillation.  His amiodarone and apixaban were stopped.  He is here today for followup and is doing well.  He denies any chest pain or pressure, SOB, DOE, PND, orthopnea, LE edema, dizziness, palpitations or syncope. He is compliant with his meds and is tolerating meds with no SE.    Past Medical History:  Diagnosis Date  . CAD (coronary artery disease), native coronary artery 09/20/2017  . Diabetes mellitus without complication (HCC)   . Hyperlipidemia   . Hypertension     Past Surgical History:  Procedure Laterality Date  . CORONARY ARTERY BYPASS GRAFT N/A 07/23/2017   Procedure: CORONARY ARTERY BYPASS GRAFTING (CABG) x Five , using left internal mammary artery and right leg greater saphenous vein harvested endoscopically;  Surgeon: Delight OvensGerhardt, Edward B, MD;  [facility];  Service: Open Heart Surgery;  Laterality: N/A;  . LEFT HEART CATH AND CORONARY ANGIOGRAPHY N/A 07/22/2017   Procedure: LEFT HEART CATH AND CORONARY ANGIOGRAPHY;  Surgeon: SwazilandJordan, Peter M, MD;  [facility];  Service: Cardiovascular;  Laterality: N/A;  . TEE WITHOUT CARDIOVERSION N/A 07/23/2017   Procedure: TRANSESOPHAGEAL  ECHOCARDIOGRAM (TEE);  Surgeon: Delight OvensGerhardt, Edward B, MD;  [facility];  Service: Open Heart Surgery;  Laterality: N/A;    Current Medications: Current Meds  Medication Sig  . acetaminophen (TYLENOL) 500 MG tablet Take 1 tablet (500 mg total) by mouth every 6 (six) hours as needed.  Marland Kitchen. aspirin EC 81 MG EC tablet Take 1 tablet (81 mg total) by mouth daily.  Marland Kitchen. atorvastatin (LIPITOR) 80 MG tablet Take 1 tablet (80 mg total) by mouth daily at 6 PM.  . hydrochlorothiazide (HYDRODIURIL) 25 MG tablet hydrochlorothiazide 25 mg tablet  TOMA UNA TABLETA POR LA BOCA DIARIA PARA LA PRESION ARTERIAL  . influenza vac split quadrivalent PF (AFLURIA QUADRIVALENT) 0.5 ML injection Afluria Qd 2020-21 (36 mos up)(PF)60 mcg (15 mcg x4)/0.5 mL IM syringe  INJECT 0.5 ML INTO THE MUSCLE ONCE  . losartan (COZAAR) 100 MG tablet losartan 100 mg tablet  TOMA UNA TABLETA POR LA BOCA DIARIA  . metFORMIN (GLUCOPHAGE) 1000 MG tablet Take 1,000 mg by mouth 2 (two) times daily.  . metoprolol tartrate (LOPRESSOR) 50 MG tablet Take 1 tablet (50 mg total) by mouth 2 (two) times daily.     Allergies:   Tape   Social History   Socioeconomic History  . Marital status: Married    Spouse name: Not on file  . Number of children: Not on file  . Years of education: Not on file  . Highest education level: Not on file  Occupational History  . Not  on file  Social Needs  . Financial resource strain: Not on file  . Food insecurity    Worry: Not on file    Inability: Not on file  . Transportation needs    Medical: Not on file    Non-medical: Not on file  Tobacco Use  . Smoking status: Former Smoker    Quit date: 07/22/1998    Years since quitting: 20.5  . Smokeless tobacco: Never Used  Substance and Sexual Activity  . Alcohol use: Yes  . Drug use: No  . Sexual activity: Not on file  Lifestyle  . Physical activity    Days per week: Not on file    Minutes per session: Not on file  . Stress: Not on file   Relationships  . Social Musician on phone: Not on file    Gets together: Not on file    Attends religious service: Not on file    Active member of club or organization: Not on file    Attends meetings of clubs or organizations: Not on file    Relationship status: Not on file  Other Topics Concern  . Not on file  Social History Narrative  . Not on file     Family History: The patient's family history includes Heart disease in his mother.  ROS:   Please see the history of present illness.    ROS  All other systems reviewed and negative.   EKGs/Labs/Other Studies Reviewed:    The following studies were reviewed today: none  EKG:  EKG is  ordered today.  The ekg ordered today demonstrates NSR   Recent Labs: No results found for requested labs within last 8760 hours.   Recent Lipid Panel    Component Value Date/Time   CHOL 103 07/22/2017 0303   TRIG 60 07/22/2017 0303   HDL 30 (L) 07/22/2017 0303   CHOLHDL 3.4 07/22/2017 0303   VLDL 12 07/22/2017 0303   LDLCALC 61 07/22/2017 0303    Physical Exam:    VS:  BP 130/76   Pulse 63   Ht 5\' 7"  (1.702 m)   Wt 166 lb 12.8 oz (75.7 kg)   BMI 26.12 kg/m     Wt Readings from Last 3 Encounters:  02/12/19 166 lb 12.8 oz (75.7 kg)  02/04/18 174 lb 9.6 oz (79.2 kg)  01/09/18 173 lb 1 oz (78.5 kg)     GEN:  Well nourished, well developed in no acute distress HEENT: Normal NECK: No JVD; No carotid bruits LYMPHATICS: No lymphadenopathy CARDIAC: RRR, no murmurs, rubs, gallops RESPIRATORY:  Clear to auscultation without rales, wheezing or rhonchi  ABDOMEN: Soft, non-tender, non-distended MUSCULOSKELETAL:  No edema; No deformity  SKIN: Warm and dry NEUROLOGIC:  Alert and oriented x 3 PSYCHIATRIC:  Normal affect   ASSESSMENT:    1. Coronary artery disease involving native coronary artery of native heart without angina pectoris   2. Essential hypertension   3. Postoperative atrial fibrillation (HCC)   4.  Hyperlipidemia LDL goal <70    PLAN:    In order of problems listed above:  1.  ASCAD -severe 3V coronary artery disease and underwent CABG x5 with LIMA to the LAD, SVG to diagonal, SVG to OM1 and OM 2, SVG to RCA 07/24/2017. -he has not had any anginal sx -continue ASA 81mg  daily, BB and statin.   2.  HTN -BP controlled -continue Lopressor 50mg  BID and Losartan-HCT 100-25mg  daily -check BMET  3.  Postop  afib -occurred after CABG -was on Amio for several weeks and then event monitor showed no silent PAF -he is now on Amio with no palpitations  4.  HLD -LDL goal < 70 -check FLP and ALT -continue Atorvastatin 80mg  daily   Medication Adjustments/Labs and Tests Ordered: Current medicines are reviewed at length with the patient today.  Concerns regarding medicines are outlined above.  Orders Placed This Encounter  Procedures  . EKG 12/Charge capture   No orders of the defined types were placed in this encounter.   Signed, Fransico Him, MD  02/12/2019 4:46 PM    [place]

## 2019-02-12 NOTE — Patient Instructions (Signed)
Medication Instructions:   Your physician recommends that you continue on your current medications as directed. Please refer to the Current Medication list given to you today.  *If you need a refill on your cardiac medications before your next appointment, please call your pharmacy*   Lab Work:  [place] BMET  If you have labs (blood work) drawn today and your tests are completely normal, you will receive your results only by: Marland Kitchen MyChart Message (if you have MyChart) OR . A paper copy in the mail If you have any lab test that is abnormal or we need to change your treatment, we will call you to review the results.    Follow-Up: At [facility], you and your health needs are our priority.  As part of our continuing mission to provide you with exceptional heart care, we have created designated Provider Care Teams.  These Care Teams include your primary Cardiologist (physician) and Advanced Practice Providers (APPs -  Physician Assistants and Nurse Practitioners) who all work together to provide you with the care you need, when you need it.  Your next appointment:   12 months  The format for your next appointment:   In Person  Provider:   Fransico Him, MD

## 2019-02-13 LAB — BASIC METABOLIC PANEL
BUN/Creatinine Ratio: 11 (ref 9–20)
BUN: 10 mg/dL (ref 6–24)
CO2: 24 mmol/L (ref 20–29)
Calcium: 10 mg/dL (ref 8.7–10.2)
Chloride: 97 mmol/L (ref 96–106)
Creatinine, Ser: 0.92 mg/dL (ref 0.76–1.27)
GFR calc Af Amer: 107 mL/min/{1.73_m2} (ref >59)
GFR calc non Af Amer: 93 mL/min/{1.73_m2} (ref >59)
Glucose: 91 mg/dL (ref 65–99)
Potassium: 4.5 mmol/L (ref 3.5–5.2)
Sodium: 137 mmol/L (ref 134–144)

## 2019-02-13 LAB — LIPID PANEL
Chol/HDL Ratio: 2.8 ratio (ref 0.0–5.0)
Cholesterol, Total: 106 mg/dL (ref 100–199)
HDL: 38 mg/dL — ABNORMAL LOW (ref >39)
LDL Chol Calc (NIH): 53 mg/dL (ref 0–99)
Triglycerides: 72 mg/dL (ref 0–149)
VLDL Cholesterol Cal: 15 mg/dL (ref 5–40)

## 2019-03-13 IMAGING — DX DG CHEST 1V PORT
1 series · 1 of 1 positions shown · non-contrast
Comparison: 07/23/2016.

CLINICAL DATA: 56-year-old male post CABG.  Subsequent encounter.

EXAM:
PORTABLE CHEST 1 VIEW

[chest ap]
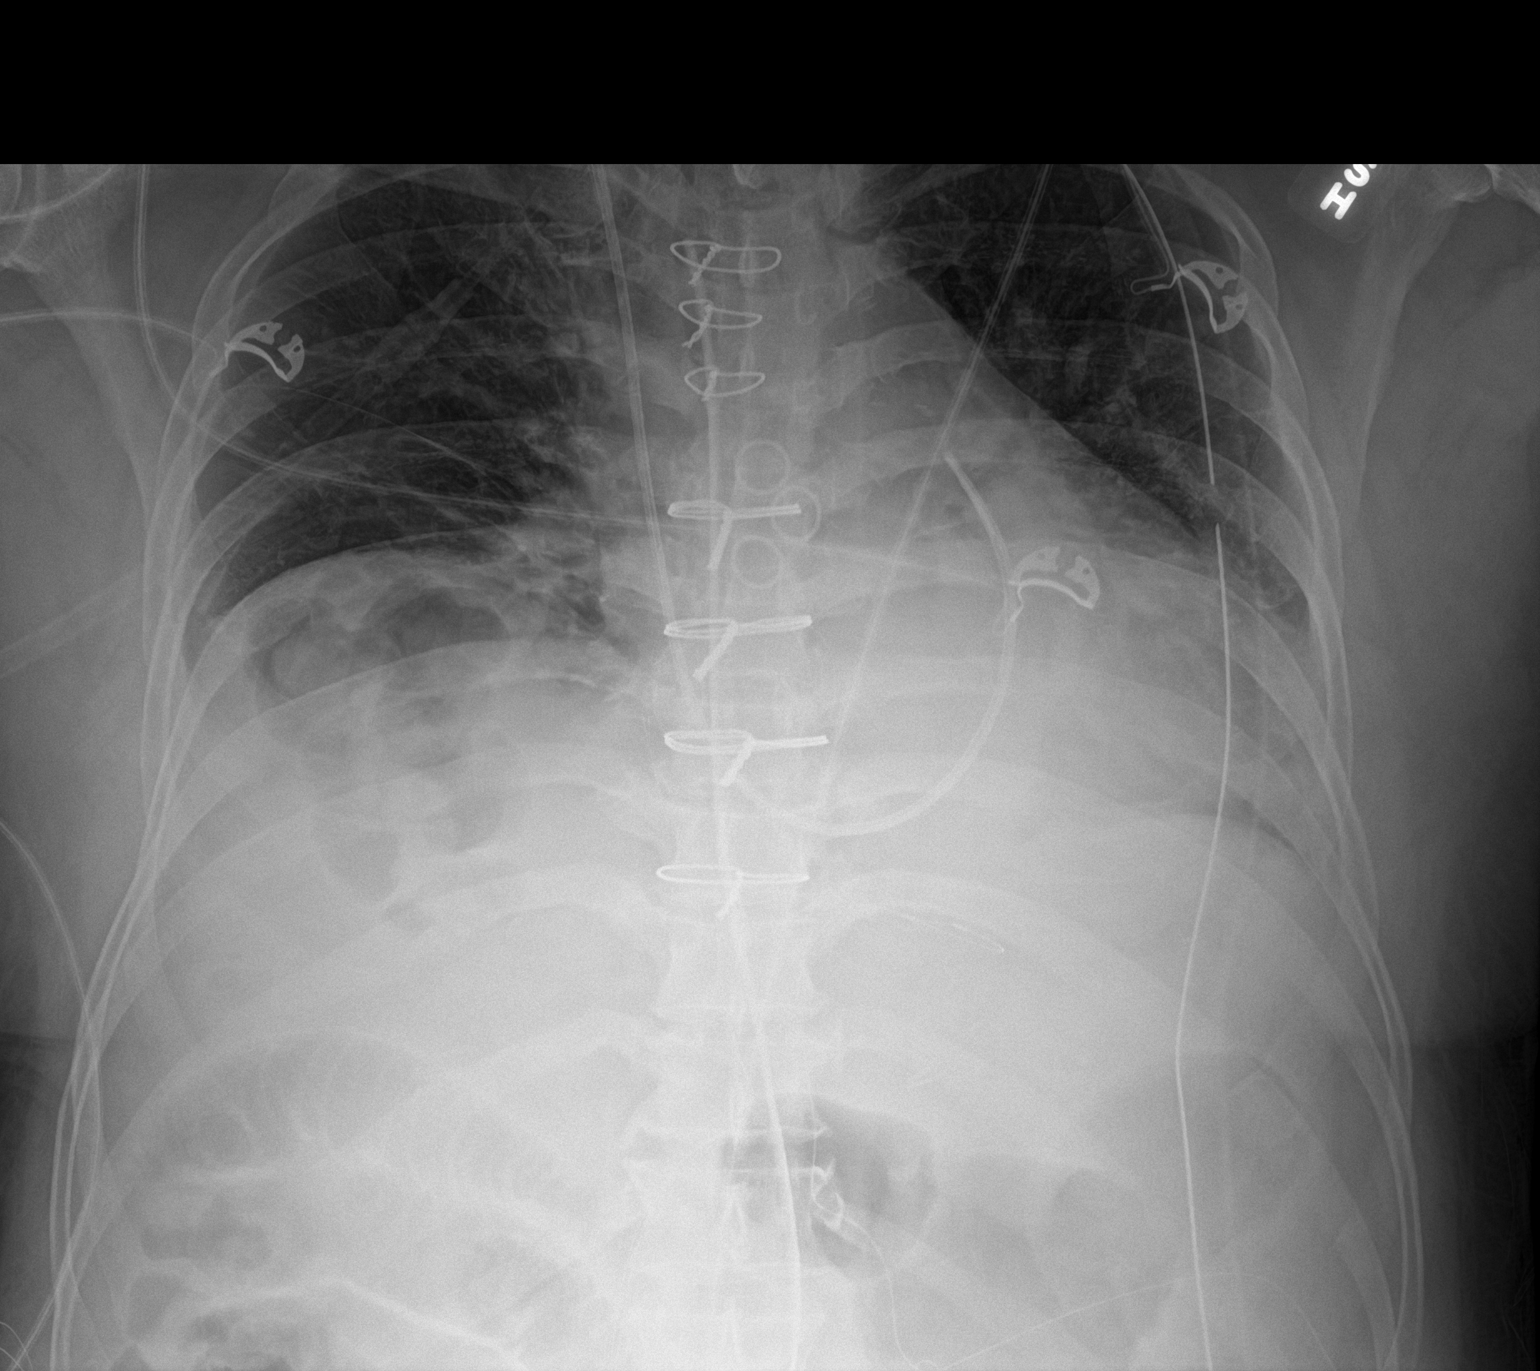

[1 of 1 positions shown; findings below may reference images not displayed]

FINDINGS: Endotracheal tube and nasogastric tube and been removed.

Swan-Ganz catheter enters from the right with the tip at the level
of the pulmonary outflow tract unchanged. Mediastinal drain
left-sided chest tube in place. No pneumothorax noted.

Post CABG.  Cardiomegaly.

Bibasilar subsegmental atelectasis with mildly elevated right
hemidiaphragm.

Mild central pulmonary vascular prominence.

Colonic interposition of bowel below right hemidiaphragm once again
noted. Gas-filled top-normal size small bowel loops.
IMPRESSION: Endotracheal tube and nasogastric tube and been removed.

Slight decrease in degree of inspiration. Otherwise no significant
change.

## 2019-03-14 IMAGING — DX DG CHEST 1V PORT
1 series · 1 of 1 positions shown · non-contrast
Comparison: 07/24/2017

CLINICAL DATA: Follow-up chest tube placement

EXAM:
PORTABLE CHEST 1 VIEW

[chest ap]
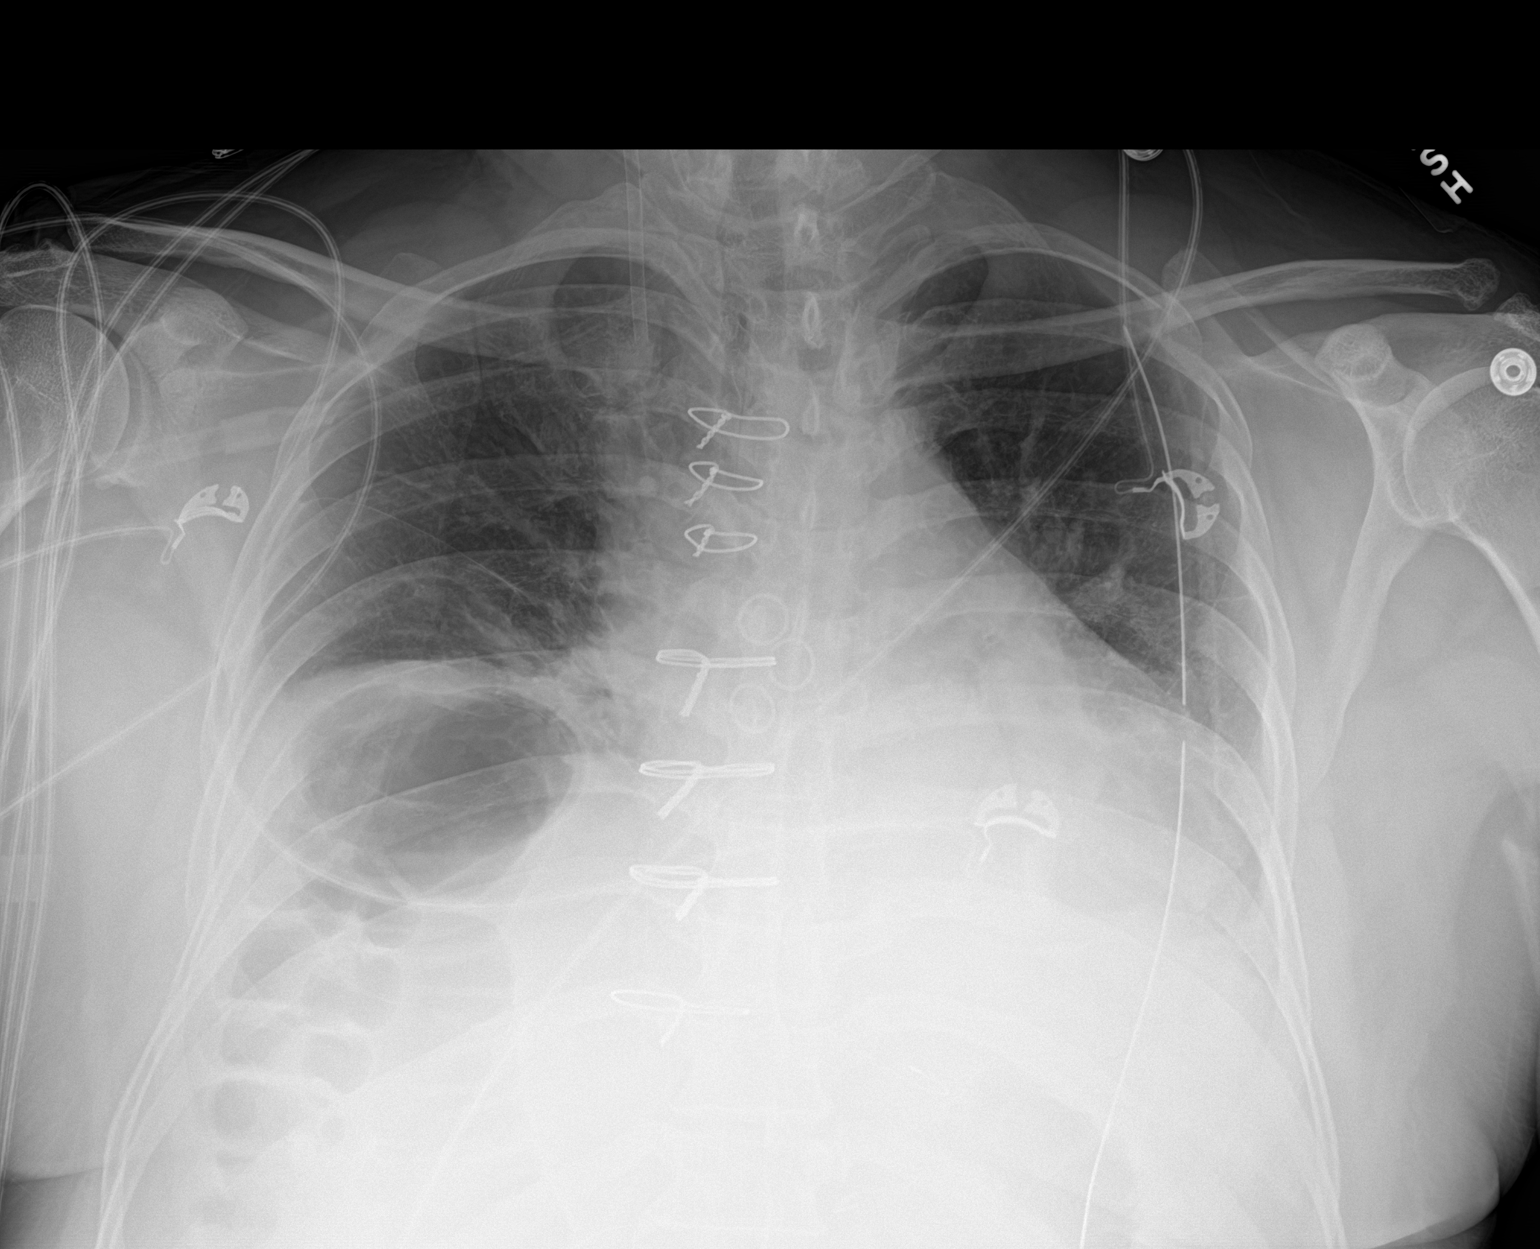

[1 of 1 positions shown; findings below may reference images not displayed]

FINDINGS: Cardiac shadow is again mildly enlarged. Swan-Ganz catheter and
mediastinal drain have been removed in the interval. Left
thoracostomy catheter remains in place. No pneumothorax is seen.
Elevation the right hemidiaphragm is again noted. Mild left basilar
atelectasis is seen.
IMPRESSION: No pneumothorax.

Mild left basilar atelectasis.

## 2019-03-15 IMAGING — DX DG CHEST 2V
2 series · 2 of 2 positions shown · non-contrast
Comparison: Portable chest x-ray of 07/25/2017

CLINICAL DATA: Chest soreness after CABG

EXAM:
CHEST - 2 VIEW

[w chest pa]
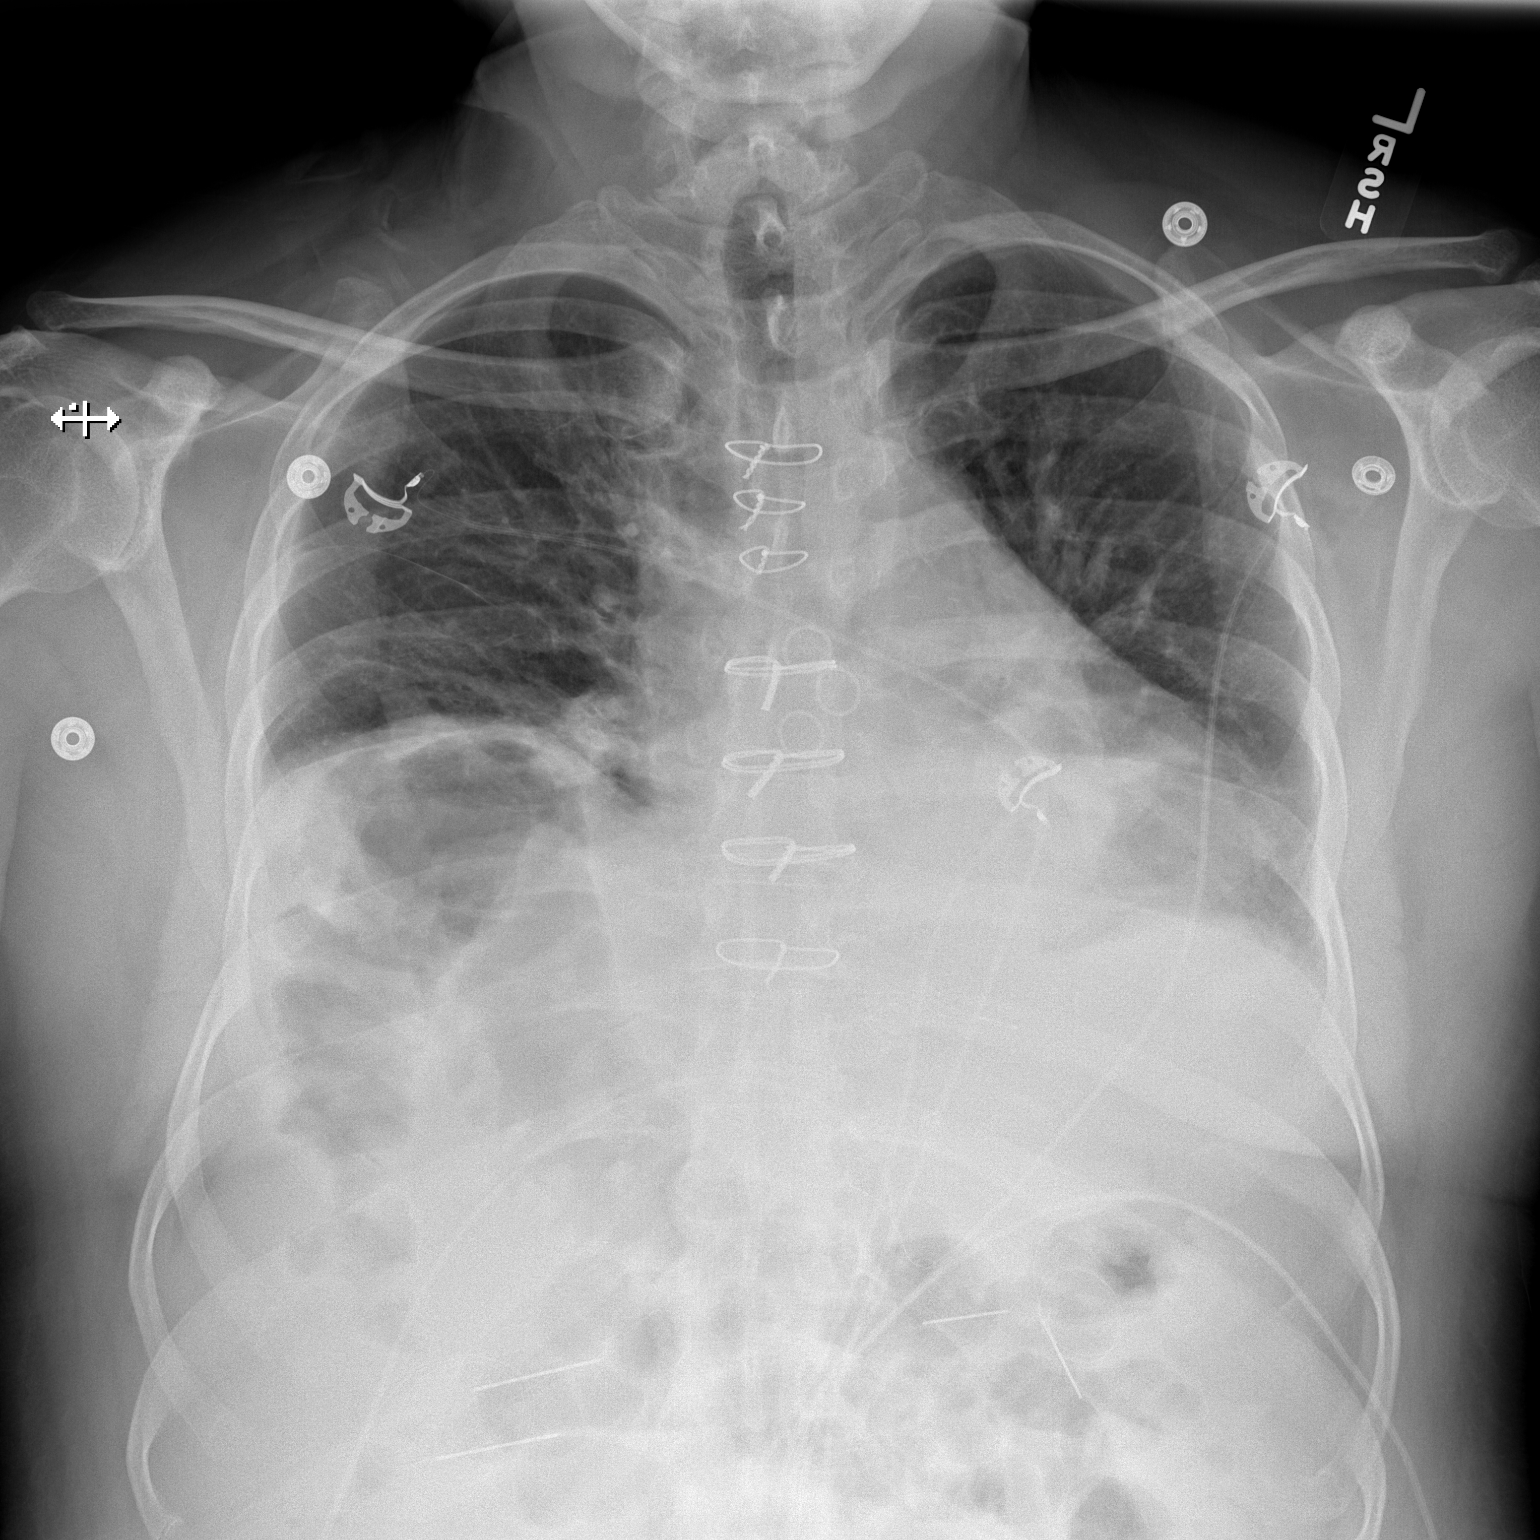

[w chest lat]
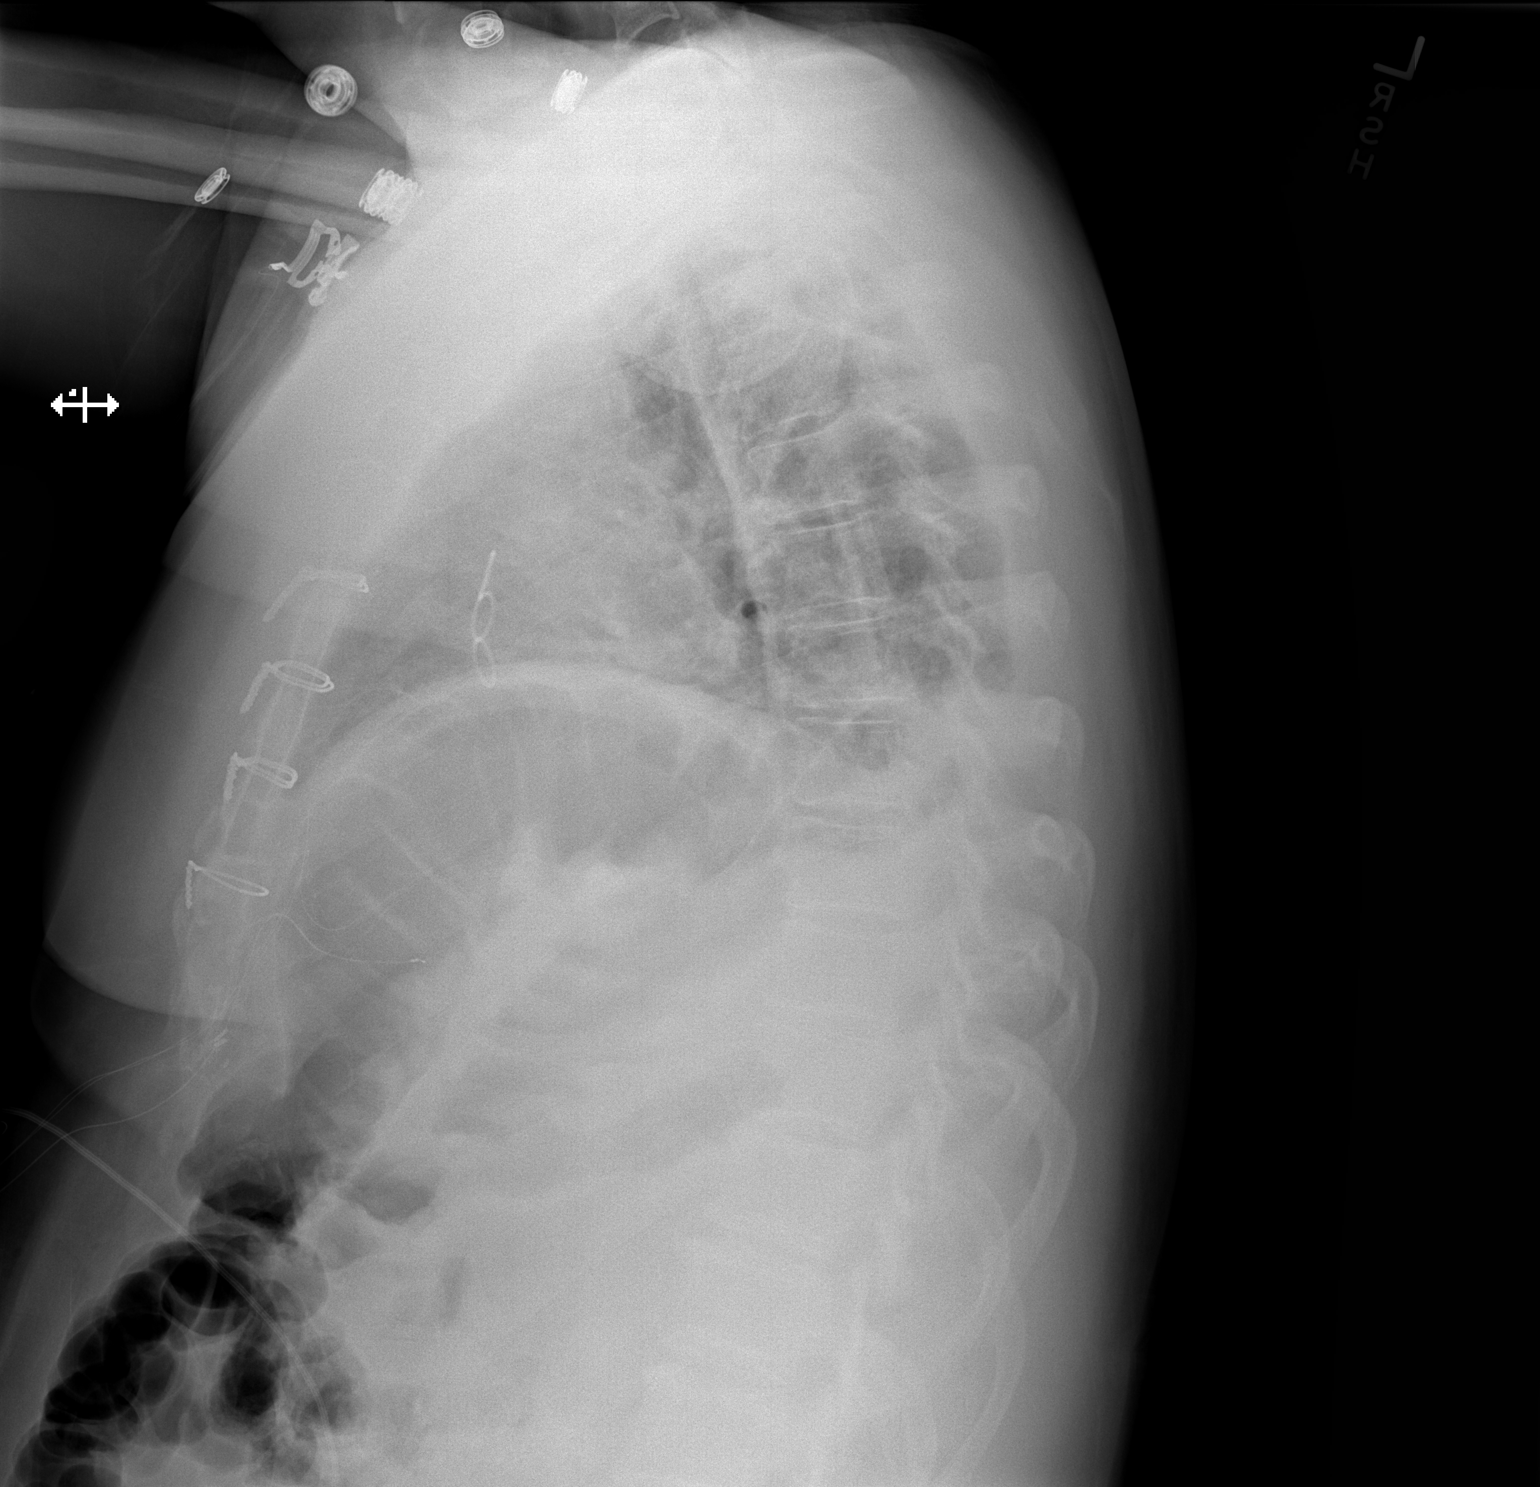

[2 of 2 positions shown; findings below may reference images not displayed]

FINDINGS: Aeration may have improved slightly. The left chest tube has been
removed and no pneumothorax is seen. Also, the venous sheath has
been removed with no right pneumothorax noted. Opacity remains
particularly medially at the left lung base which may represent
atelectasis, but pneumonia cannot be excluded. The heart is mildly
enlarged and stable. Median sternotomy sutures are noted secondary
to recent CABG.
IMPRESSION: 1. Left chest tube removed.  No pneumothorax.
2. Opacity remains medially at the left lung base. Atelectasis
versus pneumonia.

## 2019-04-17 IMAGING — DX DG CHEST 2V
2 series · 2 of 2 positions shown · non-contrast
Comparison: 08/06/2017

CLINICAL DATA: Hypertension and diabetes.  Incisional pain.

EXAM:
CHEST - 2 VIEW

[dg chest 2 view (1 of 2)]
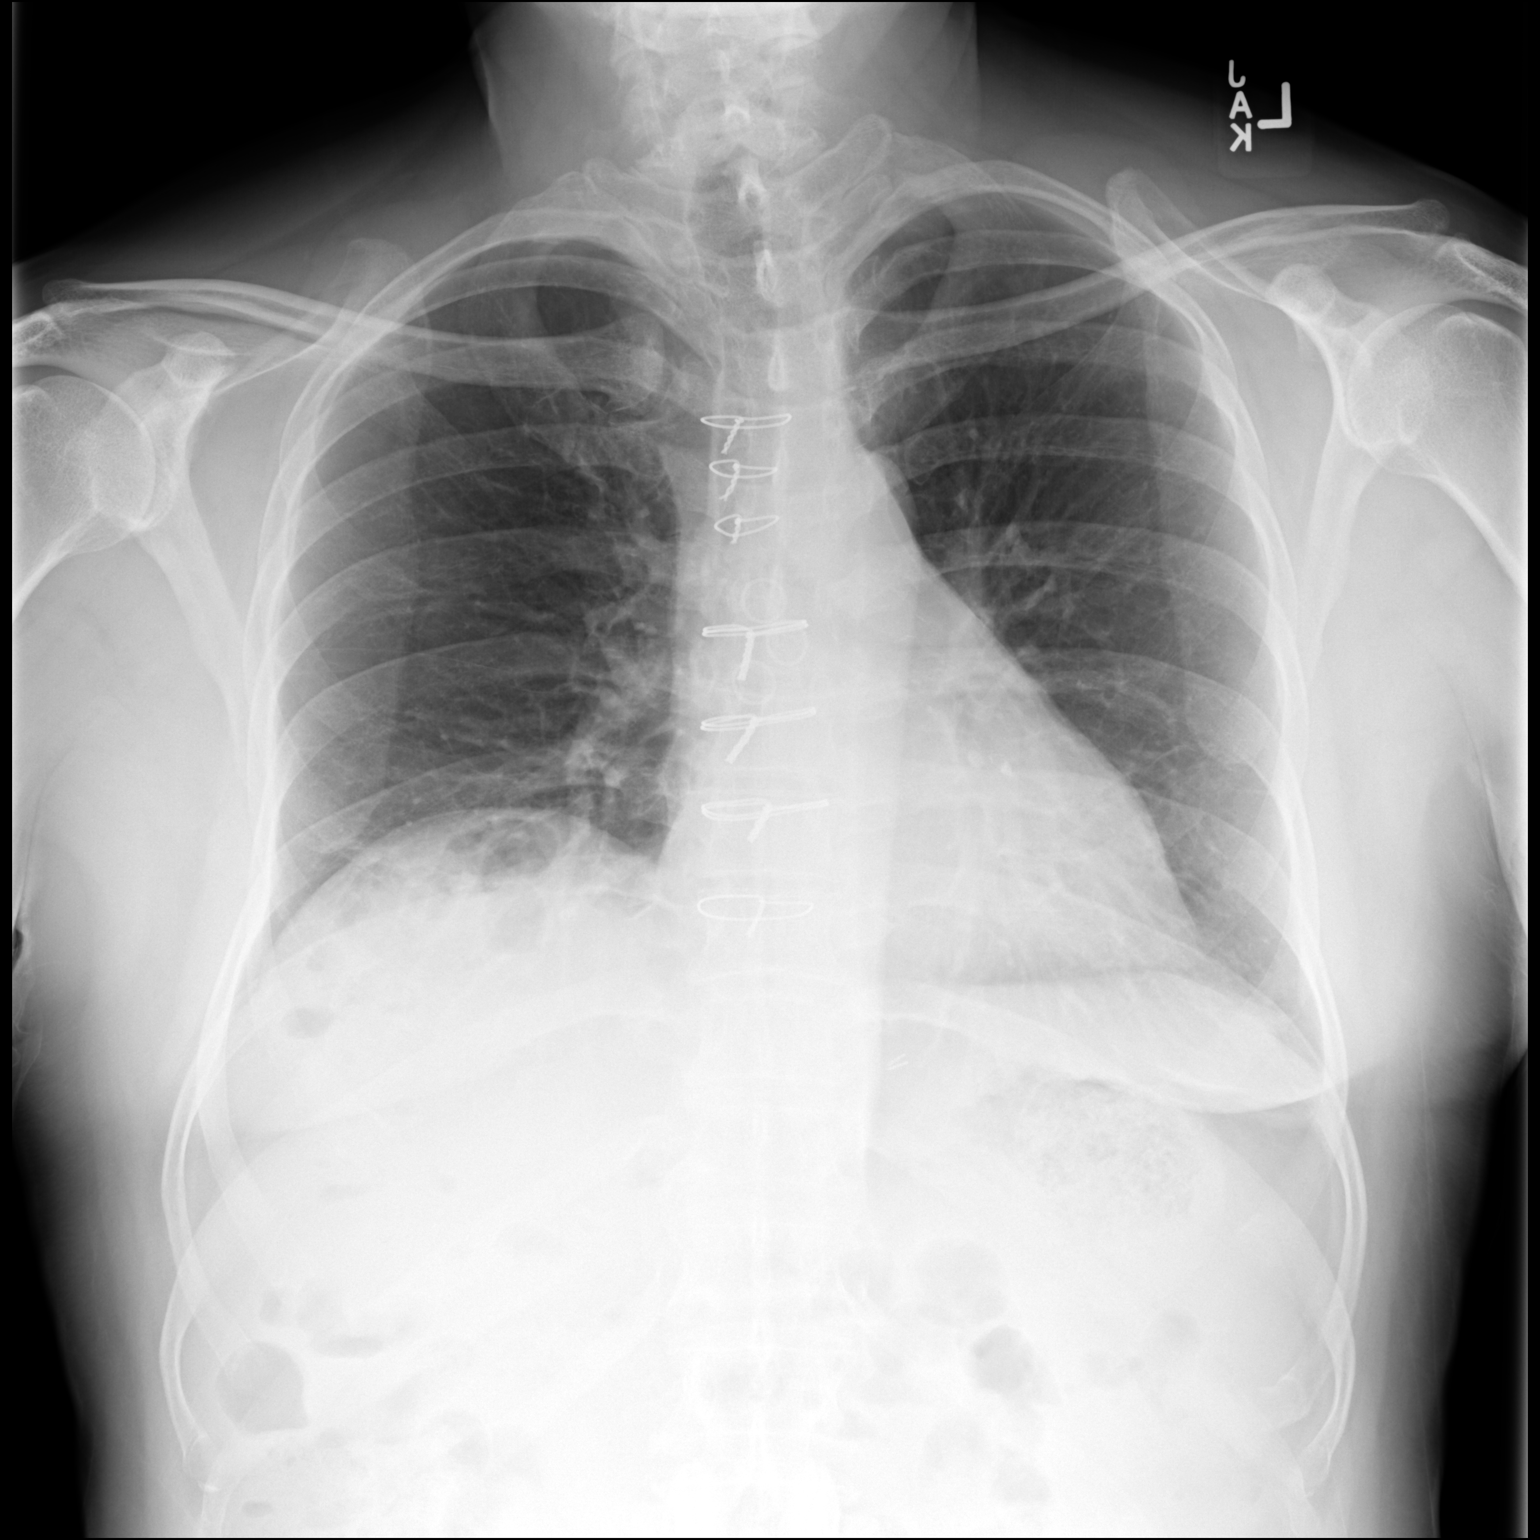

[dg chest 2 view (2 of 2)]
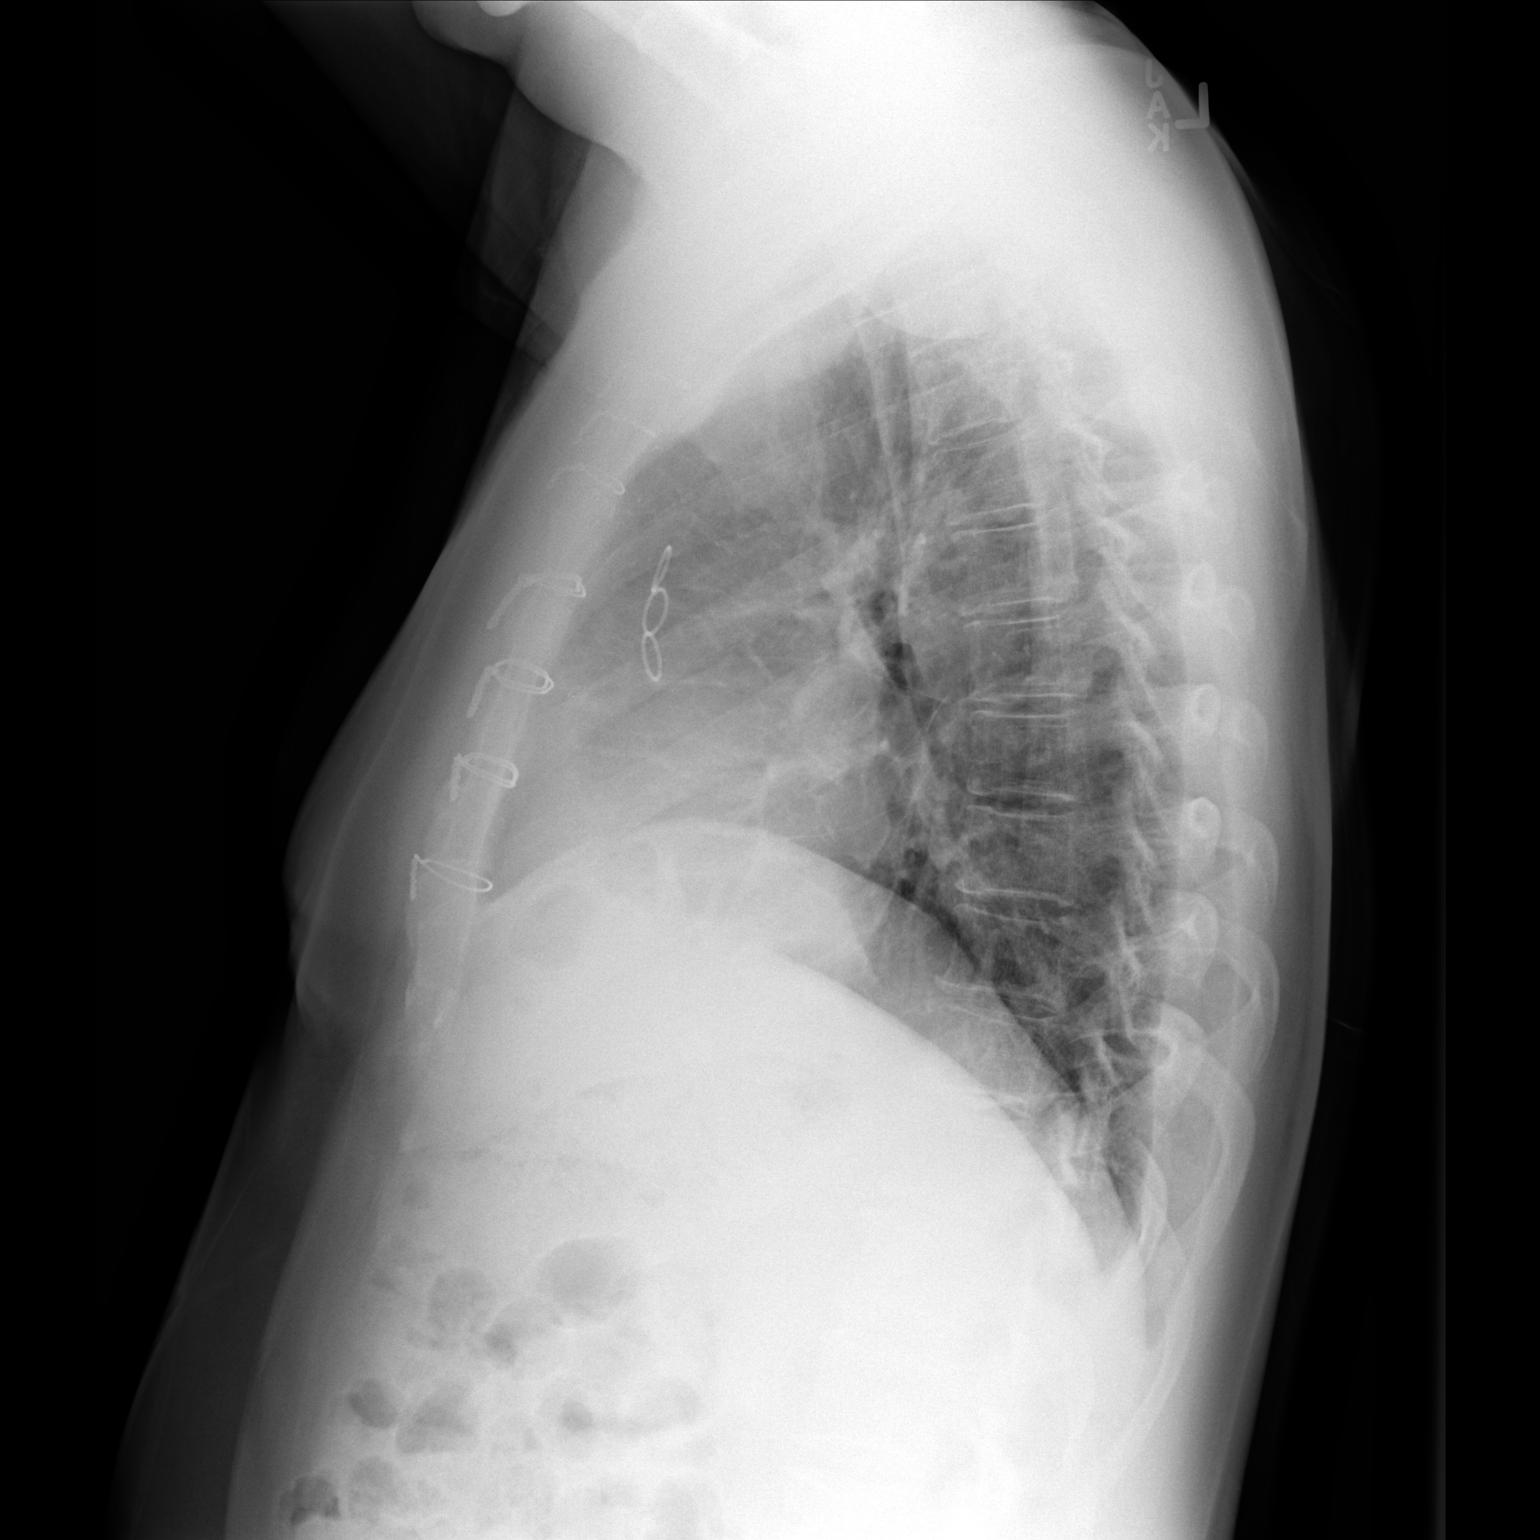

[2 of 2 positions shown; findings below may reference images not displayed]

FINDINGS: Previous median sternotomy and CABG. Improved aeration at the lung
bases. Mild residual volume loss at the right base. No edema. No
effusions. No unexpected bone finding.
IMPRESSION: Improved since last exam. Only mild residual volume loss at the
right lung base.

## 2020-02-12 ENCOUNTER — Ambulatory Visit (INDEPENDENT_AMBULATORY_CARE_PROVIDER_SITE_OTHER): Payer: 59 | Admitting: Cardiology

## 2020-02-12 ENCOUNTER — Other Ambulatory Visit: Payer: Self-pay

## 2020-02-12 ENCOUNTER — Encounter: Payer: Self-pay | Admitting: Cardiology

## 2020-02-12 VITALS — BP 160/84 | HR 82 | Wt 173.8 lb

## 2020-02-12 DIAGNOSIS — I4891 Unspecified atrial fibrillation: Secondary | ICD-10-CM

## 2020-02-12 DIAGNOSIS — I9789 Other postprocedural complications and disorders of the circulatory system, not elsewhere classified: Secondary | ICD-10-CM

## 2020-02-12 DIAGNOSIS — I2510 Atherosclerotic heart disease of native coronary artery without angina pectoris: Secondary | ICD-10-CM

## 2020-02-12 DIAGNOSIS — I10 Essential (primary) hypertension: Secondary | ICD-10-CM | POA: Diagnosis not present

## 2020-02-12 DIAGNOSIS — E785 Hyperlipidemia, unspecified: Secondary | ICD-10-CM

## 2020-02-12 LAB — COMPREHENSIVE METABOLIC PANEL
ALT: 17 IU/L (ref 0–44)
AST: 16 IU/L (ref 0–40)
Albumin/Globulin Ratio: 2 (ref 1.2–2.2)
Albumin: 4.7 g/dL (ref 3.8–4.9)
Alkaline Phosphatase: 99 IU/L (ref 44–121)
BUN/Creatinine Ratio: 10 (ref 9–20)
BUN: 10 mg/dL (ref 6–24)
Bilirubin Total: 0.6 mg/dL (ref 0.0–1.2)
CO2: 26 mmol/L (ref 20–29)
Calcium: 9.7 mg/dL (ref 8.7–10.2)
Chloride: 97 mmol/L (ref 96–106)
Creatinine, Ser: 0.98 mg/dL (ref 0.76–1.27)
GFR calc Af Amer: 99 mL/min/{1.73_m2} (ref >59)
GFR calc non Af Amer: 85 mL/min/{1.73_m2} (ref >59)
Globulin, Total: 2.4 g/dL (ref 1.5–4.5)
Glucose: 160 mg/dL — ABNORMAL HIGH (ref 65–99)
Potassium: 3.8 mmol/L (ref 3.5–5.2)
Sodium: 136 mmol/L (ref 134–144)
Total Protein: 7.1 g/dL (ref 6.0–8.5)

## 2020-02-12 LAB — LIPID PANEL
Chol/HDL Ratio: 3.1 ratio (ref 0.0–5.0)
Cholesterol, Total: 99 mg/dL — ABNORMAL LOW (ref 100–199)
HDL: 32 mg/dL — ABNORMAL LOW (ref >39)
LDL Chol Calc (NIH): 45 mg/dL (ref 0–99)
Triglycerides: 123 mg/dL (ref 0–149)
VLDL Cholesterol Cal: 22 mg/dL (ref 5–40)

## 2020-02-12 NOTE — Patient Instructions (Signed)
Medication Instructions:  Your physician recommends that you continue on your current medications as directed. Please refer to the Current Medication list given to you today.  *If you need a refill on your cardiac medications before your next appointment, please call your pharmacy*   Lab Work: TODAY: CMET and FLP If you have labs (blood work) drawn today and your tests are completely normal, you will receive your results only by: Marland Kitchen MyChart Message (if you have MyChart) OR . A paper copy in the mail If you have any lab test that is abnormal or we need to change your treatment, we will call you to review the results.  Follow-Up: At [facility], you and your health needs are our priority.  As part of our continuing mission to provide you with exceptional heart care, we have created designated Provider Care Teams.  These Care Teams include your primary Cardiologist (physician) and Advanced Practice Providers (APPs -  Physician Assistants and Nurse Practitioners) who all work together to provide you with the care you need, when you need it.  We recommend signing up for the patient portal called "MyChart".  Sign up information is provided on this After Visit Summary.  MyChart is used to connect with patients for Virtual Visits (Telemedicine).  Patients are able to view lab/test results, encounter notes, upcoming appointments, etc.  Non-urgent messages can be sent to your provider as well.   To learn more about what you can do with MyChart, go to [email or web address].    Your next appointment:   1 year(s)  The format for your next appointment:   In Person  Provider:   You may see Armanda Magic, MD or one of the following Advanced Practice Providers on your designated Care Team:    Ronie Spies, PA-C  Jacolyn Reedy, PA-C

## 2020-02-12 NOTE — Progress Notes (Signed)
Cardiology Office Note:    Date:  02/12/2020   ID:  [name], DOB 08/23/1960, MRN [id number]  PCP:  Sandre Kitty, PA-C  Cardiologist:  Armanda Magic, MD    Referring MD: Sandre Kitty, PA-C   Chief Complaint  Patient presents with  . Coronary Artery Disease  . Hypertension  . Hyperlipidemia  . Atrial Fibrillation    History of Present Illness:    [name] is a 59 y.o. male with a hx of NSTEMI found to have severe three-vessel coronary artery disease and underwent CABG x5 with LIMA to the LAD, SVG to diagonal, SVG to OM1 and OM 2, SVG to RCA 07/24/2017.He also had postop atrial fibrillation and was placed on amiodarone and apixaban for 6 to 8 weeks. Patient also has hyperlipidemia and diabetes.  Event monitor in June 2018 showed no recurrent atrial fibrillation.  His amiodarone and apixaban were stopped.  He is here today for followup and is doing well.  He occasionally has some sharp stabbing pains when he gets home from work but no other time when he is home on the weekends.  He denies any anginal chest pain or pressure, SOB, DOE, PND, orthopnea, LE edema (except when on an airplane), dizziness, palpitations or syncope. He is compliant with his meds and is tolerating meds with no SE.    Past Medical History:  Diagnosis Date  . CAD (coronary artery disease), native coronary artery 09/20/2017  . Diabetes mellitus without complication (HCC)   . Hyperlipidemia   . Hypertension     Past Surgical History:  Procedure Laterality Date  . CORONARY ARTERY BYPASS GRAFT N/A 07/23/2017   Procedure: CORONARY ARTERY BYPASS GRAFTING (CABG) x Five , using left internal mammary artery and right leg greater saphenous vein harvested endoscopically;  Surgeon: [name], MD;  Location: [facility] OR;  Service: Open Heart Surgery;  Laterality: N/A;  . LEFT HEART CATH AND CORONARY ANGIOGRAPHY N/A 07/22/2017   Procedure: LEFT HEART CATH AND CORONARY ANGIOGRAPHY;  Surgeon: Swaziland,  Peter M, MD;  [facility]: [facility] INVASIVE CV LAB;  Service: Cardiovascular;  Laterality: N/A;  . TEE WITHOUT CARDIOVERSION N/A 07/23/2017   Procedure: TRANSESOPHAGEAL ECHOCARDIOGRAM (TEE);  Surgeon: [name], MD;  Location: [facility] OR;  Service: Open Heart Surgery;  Laterality: N/A;    Current Medications: Current Meds  Medication Sig  . acetaminophen (TYLENOL) 500 MG tablet Take 1 tablet (500 mg total) by mouth every 6 (six) hours as needed.  Marland Kitchen aspirin EC 81 MG EC tablet Take 1 tablet (81 mg total) by mouth daily.  Marland Kitchen atorvastatin (LIPITOR) 80 MG tablet Take 1 tablet (80 mg total) by mouth daily at 6 PM.  . hydrochlorothiazide (HYDRODIURIL) 25 MG tablet hydrochlorothiazide 25 mg tablet  TOMA UNA TABLETA POR LA BOCA DIARIA PARA LA PRESION ARTERIAL  . influenza vac split quadrivalent PF (AFLURIA QUADRIVALENT) 0.5 ML injection Afluria Qd 2020-21 (36 mos up)(PF)60 mcg (15 mcg x4)/0.5 mL IM syringe  INJECT 0.5 ML INTO THE MUSCLE ONCE  . losartan (COZAAR) 100 MG tablet losartan 100 mg tablet  TOMA UNA TABLETA POR LA BOCA DIARIA  . metFORMIN (GLUCOPHAGE) 1000 MG tablet Take 1,000 mg by mouth 2 (two) times daily.  . metoprolol tartrate (LOPRESSOR) 50 MG tablet Take 1 tablet (50 mg total) by mouth 2 (two) times daily.     Allergies:   Tape   Social History   Socioeconomic History  . Marital status: Married    Spouse name: Not on  file  . Number of children: Not on file  . Years of education: Not on file  . Highest education level: Not on file  Occupational History  . Not on file  Tobacco Use  . Smoking status: Former Smoker    Quit date: 07/22/1998    Years since quitting: 21.5  . Smokeless tobacco: Never Used  Substance and Sexual Activity  . Alcohol use: Yes  . Drug use: No  . Sexual activity: Not on file  Other Topics Concern  . Not on file  Social History Narrative  . Not on file   Social Determinants of Health   Financial Resource Strain:   . Difficulty of Paying Living  Expenses: Not on file  Food Insecurity:   . Worried About Programme researcher, broadcasting/film/video in the Last Year: Not on file  . Ran Out of Food in the Last Year: Not on file  Transportation Needs:   . Lack of Transportation (Medical): Not on file  . Lack of Transportation (Non-Medical): Not on file  Physical Activity:   . Days of Exercise per Week: Not on file  . Minutes of Exercise per Session: Not on file  Stress:   . Feeling of Stress : Not on file  Social Connections:   . Frequency of Communication with Friends and Family: Not on file  . Frequency of Social Gatherings with Friends and Family: Not on file  . Attends Religious Services: Not on file  . Active Member of Clubs or Organizations: Not on file  . Attends Banker Meetings: Not on file  . Marital Status: Not on file     Family History: The patient's family history includes Heart disease in his mother.  ROS:   Please see the history of present illness.    ROS  All other systems reviewed and negative.   EKGs/Labs/Other Studies Reviewed:    The following studies were reviewed today: none  EKG:  EKG is  ordered today.  The ekg ordered today demonstrates NSR   Recent Labs: 02/12/2019: BUN 10; Creatinine, Ser 0.92; Potassium 4.5; Sodium 137   Recent Lipid Panel    Component Value Date/Time   CHOL 106 02/12/2019 1658   TRIG 72 02/12/2019 1658   HDL 38 (L) 02/12/2019 1658   CHOLHDL 2.8 02/12/2019 1658   CHOLHDL 3.4 07/22/2017 0303   VLDL 12 07/22/2017 0303   LDLCALC 53 02/12/2019 1658    Physical Exam:    VS:  BP (!) 160/84 (BP Location: Left Arm, Patient Position: Sitting, Cuff Size: Normal)   Pulse 82   Wt 173 lb 12.8 oz (78.8 kg)   BMI 27.22 kg/m     Wt Readings from Last 3 Encounters:  02/12/20 173 lb 12.8 oz (78.8 kg)  02/12/19 166 lb 12.8 oz (75.7 kg)  02/04/18 174 lb 9.6 oz (79.2 kg)     GEN: Well nourished, well developed in no acute distress HEENT: Normal NECK: No JVD; No carotid  bruits LYMPHATICS: No lymphadenopathy CARDIAC:RRR, no murmurs, rubs, gallops RESPIRATORY:  Clear to auscultation without rales, wheezing or rhonchi  ABDOMEN: Soft, non-tender, non-distended MUSCULOSKELETAL:  No edema; No deformity  SKIN: Warm and dry NEUROLOGIC:  Alert and oriented x 3 PSYCHIATRIC:  Normal affect    ASSESSMENT:    1. Coronary artery disease involving native coronary artery of native heart without angina pectoris   2. Essential hypertension   3. Postoperative atrial fibrillation (HCC)   4. Hyperlipidemia LDL goal <70  PLAN:    In order of problems listed above:  1.  ASCAD -severe 3V coronary artery disease and underwent CABG x5 with LIMA to the LAD, SVG to diagonal, SVG to OM1 and OM 2, SVG to RCA 07/24/2017. -he denies any anginal symptoms. -continue ASA 81mg  daily, BB and statin.   2.  HTN -BP is borderline controlled on exam this am but at home it runs 120/51mmHg -continue Lopressor 50mg  BID and Losartan 100mg  daily and HCTZ 25mg  daily -check BMET  3.  Postop afib -occurred after CABG -he denies any palpitations  4.  HLD -LDL goal < 70 -repeat FLP and ALT -continue Atorvastatin 80mg  daily   Medication Adjustments/Labs and Tests Ordered: Current medicines are reviewed at length with the patient today.  Concerns regarding medicines are outlined above.  Orders Placed This Encounter  Procedures  . EKG 12-Lead   No orders of the defined types were placed in this encounter.   Signed, 91m, MD  02/12/2020 8:09 AM    [facility]

## 2020-02-12 NOTE — Addendum Note (Signed)
Addended by: Theresia Majors on: 02/12/2020 08:29 AM   Modules accepted: Orders

## 2021-01-19 ENCOUNTER — Other Ambulatory Visit: Payer: Self-pay

## 2021-01-19 ENCOUNTER — Encounter (INDEPENDENT_AMBULATORY_CARE_PROVIDER_SITE_OTHER): Payer: Self-pay | Admitting: Primary Care

## 2021-01-19 ENCOUNTER — Ambulatory Visit (INDEPENDENT_AMBULATORY_CARE_PROVIDER_SITE_OTHER): Payer: No Typology Code available for payment source | Admitting: Primary Care

## 2021-01-19 VITALS — BP 122/83 | HR 84 | Temp 97.3°F | Ht 67.0 in | Wt 173.8 lb

## 2021-01-19 DIAGNOSIS — I10 Essential (primary) hypertension: Secondary | ICD-10-CM

## 2021-01-19 DIAGNOSIS — E119 Type 2 diabetes mellitus without complications: Secondary | ICD-10-CM | POA: Diagnosis not present

## 2021-01-19 DIAGNOSIS — E785 Hyperlipidemia, unspecified: Secondary | ICD-10-CM

## 2021-01-19 DIAGNOSIS — Z7689 Persons encountering health services in other specified circumstances: Secondary | ICD-10-CM

## 2021-01-19 DIAGNOSIS — Z23 Encounter for immunization: Secondary | ICD-10-CM

## 2021-01-19 DIAGNOSIS — E1100 Type 2 diabetes mellitus with hyperosmolarity without nonketotic hyperglycemic-hyperosmolar coma (NKHHC): Secondary | ICD-10-CM

## 2021-01-19 DIAGNOSIS — Z1211 Encounter for screening for malignant neoplasm of colon: Secondary | ICD-10-CM

## 2021-01-19 DIAGNOSIS — Z0189 Encounter for other specified special examinations: Secondary | ICD-10-CM

## 2021-01-19 MED ORDER — HYDROCHLOROTHIAZIDE 25 MG PO TABS: ORAL_TABLET | ORAL | 1 refills | Status: DC

## 2021-01-19 MED ORDER — METOPROLOL TARTRATE 50 MG PO TABS: 50.0000 mg | ORAL_TABLET | Freq: Two times a day (BID) | ORAL | 1 refills | Status: DC

## 2021-01-19 MED ORDER — METFORMIN HCL 1000 MG PO TABS: 1000.0000 mg | ORAL_TABLET | Freq: Two times a day (BID) | ORAL | 1 refills | Status: DC

## 2021-01-19 MED ORDER — ATORVASTATIN CALCIUM 80 MG PO TABS: 80.0000 mg | ORAL_TABLET | Freq: Every day | ORAL | 1 refills | Status: DC

## 2021-01-19 MED ORDER — LOSARTAN POTASSIUM 100 MG PO TABS: ORAL_TABLET | ORAL | 1 refills | Status: DC

## 2021-01-19 NOTE — Patient Instructions (Signed)
Gripe en los adultos Influenza, Adult A la gripe tambin se la conoce como "influenza". Es una infeccin en los pulmones, la nariz y la garganta (vas respiratorias). Se transmite fcilmente de persona a persona (es contagiosa). La gripe causa sntomas que son como los de un resfro, junto con fiebre alta y dolores corporales. Cules son las causas? La causa de esta afeccin es el virus de la influenza. Puede contraer el virus de las siguientes maneras: Al inhalar gotitas que quedan en el aire despus de que una persona infectada con gripe tosi o estornud. Al tocar algo que est contaminado con el virus y luego llevarse la mano a la boca, la nariz o los ojos. Qu incrementa el riesgo? Hay ciertas cosas que lo pueden hacer ms propenso a tener gripe. Estas incluyen lo siguiente: No lavarse las manos con frecuencia. Tener contacto cercano con muchas personas durante la temporada de resfro y gripe. Tocarse la boca, los ojos o la nariz sin antes lavarse las manos. No recibir la vacuna antigripal todos los aos. Puede correr un mayor riesgo de tener gripe, y problemas graves, como una infeccin pulmonar (neumona), si usted: Es mayor de 65 aos de edad. Est embarazada. Tiene debilitado el sistema que combate las defensas (sistema inmunitario) debido a una enfermedad o a que toma determinados medicamentos. Tiene una afeccin a largo plazo (crnica), como las siguientes: Enfermedad cardaca, renal o pulmonar. Diabetes. Asma. Tiene un trastorno heptico. Tiene mucho sobrepeso (obesidad mrbida). Tiene anemia. Cules son los signos o sntomas? Los sntomas normalmente comienzan de repente y duran entre 4 y 14 das. Pueden incluir los siguientes: Fiebre y escalofros. Dolores de cabeza, dolores en el cuerpo o dolores musculares. Dolor de garganta. Tos. Secrecin o congestin nasal. Molestias en el pecho. No querer comer tanto como lo hace normalmente. Sensacin de debilidad o  cansancio. Mareos. Malestar estomacal o vmitos. Cmo se trata? Si la gripe se detecta de forma temprana, puede recibir tratamiento con medicamentos antivirales. Esto puede ayudar a reducir la gravedad y la duracin de la enfermedad. Se los administrarn por boca o a travs de un tubo (catter) intravenoso. Cuidarse en su hogar puede ayudar a que mejoren los sntomas. El mdico puede recomendarle lo siguiente: Tomar medicamentos de venta libre. Beber abundante lquido. La gripe suele desaparecer sola. Si tiene sntomas muy graves u otros problemas, puede recibir tratamiento en un hospital. Siga estas instrucciones en su casa:   Actividad Descanse todo lo que sea necesario. Duerma lo suficiente. Qudese en su casa y no concurra al trabajo o a la escuela, como se lo haya indicado el mdico. No salga de su casa hasta que no haya tenido fiebre por 24 horas sin tomar medicamentos. Salga de su casa solamente para ir al mdico. Comida y bebida Tome una SRO (solucin de rehidratacin oral). Es una bebida que se vende en farmacias y tiendas. Beba suficiente lquido como para mantener la orina de color amarillo plido. En la medida en que pueda, beba lquidos transparentes en pequeas cantidades. Los lquidos transparentes son, por ejemplo: Agua. Trocitos de hielo. Jugo de frutas mezclado con agua. Bebidas deportivas de bajas caloras. Coma alimentos suaves que sean fciles de digerir. En la medida que pueda, consuma pequeas cantidades. Estos alimentos incluyen: Bananas. Pur de manzana. Arroz. Carnes magras. Tostadas. Galletas. No coma ni beba lo siguiente: Lquidos con alto contenido de azcar o cafena. Alcohol. Alimentos condimentados o con alto contenido de grasa. Indicaciones generales Use los medicamentos de venta libre y los recetados solamente   como se lo haya indicado el mdico. Use un humidificador de aire fro para que el aire de su casa est ms hmedo. Esto puede facilitar  la respiracin. Cuando utilice un humidificador de vapor fro, lmpielo a diario. Vace el agua y cmbiela por agua limpia. Al toser o estornudar, cbrase la boca y la nariz. Lvese las manos frecuentemente con agua y jabn y durante al menos 20 segundos. Esto tambin es importante despus de toser o estornudar. Si no dispone de agua y jabn, use desinfectante para manos con alcohol. Cumpla con todas las visitas de seguimiento. Cmo se previene?  Colquese la vacuna antigripal todos los aos. Puede colocarse la vacuna contra la gripe a fines de verano, en otoo o en invierno. Pregntele al mdico cundo debe aplicarse la vacuna contra la gripe. Evite el contacto con personas que estn enfermas durante el otoo y el invierno. Es la temporada del resfro y la gripe. Comunquese con un mdico si: Tiene sntomas nuevos. Tiene los siguientes sntomas: Dolor de pecho. Materia fecal lquida (diarrea). Fiebre. La tos empeora. Empieza a tener ms mucosidad. Tiene malestar estomacal. Vomita. Solicite ayuda de inmediato si: Le falta el aire. Tiene dificultad para respirar. La piel o las uas se ponen de un color azulado. Presenta dolor muy intenso o rigidez en el cuello. Tiene dolor de cabeza repentino. Le duele la cara o el odo de forma repentina. No puede comer ni beber sin vomitar. Estos sntomas pueden representar un problema grave que constituye una emergencia. Solicite atencin mdica de inmediato. Comunquese con el servicio de emergencias de su localidad (911 en los [place]). No espere a ver si los sntomas desaparecen. No conduzca por sus propios medios hasta el hospital. Resumen A la gripe tambin se la conoce como "influenza". Es una infeccin en los pulmones, la nariz y la garganta. Se transmite fcilmente de una persona a otra. Use los medicamentos de venta libre y los recetados solamente como se lo haya indicado el mdico. Aplicarse la vacuna contra la gripe todos los  aos es la mejor manera de no contagiarse la gripe. Esta informacin no tiene como fin reemplazar el consejo del mdico. Asegrese de hacerle al mdico cualquier pregunta que tenga. Document Revised: 01/15/2020 Document Reviewed: 01/15/2020 Elsevier Patient Education  2022 Elsevier Inc.  

## 2021-01-19 NOTE — Progress Notes (Signed)
[facility] Patient Office Visit  Subjective:  Patient ID: [name], male    DOB: 02/16/1961  Age: 60 y.o. MRN: [id number]  CC: establish care    HPI Mr. [name] is a 60 year old Hispanic male ( interpreter Michaelle Birks [phone]) presents for establishment of care. Management of HTN Denies shortness of breath, headaches, chest pain or lower extremity edema and T2D .Current symptoms/problems include none . Past Medical History:  Diagnosis Date   CAD (coronary artery disease), native coronary artery 09/20/2017   Diabetes mellitus without complication ([place])    Hyperlipidemia    Hypertension     Past Surgical History:  Procedure Laterality Date   CORONARY ARTERY BYPASS GRAFT N/A 07/23/2017   Procedure: CORONARY ARTERY BYPASS GRAFTING (CABG) x Five , using left internal mammary artery and right leg greater saphenous vein harvested endoscopically;  Surgeon: Grace Isaac, MD;  Location: [facility];  Service: Open Heart Surgery;  Laterality: N/A;   LEFT HEART CATH AND CORONARY ANGIOGRAPHY N/A 07/22/2017   Procedure: LEFT HEART CATH AND CORONARY ANGIOGRAPHY;  Surgeon: Martinique, Peter M, MD;  Location: [facility];  Service: Cardiovascular;  Laterality: N/A;   TEE WITHOUT CARDIOVERSION N/A 07/23/2017   Procedure: TRANSESOPHAGEAL ECHOCARDIOGRAM (TEE);  Surgeon: Grace Isaac, MD;  Location: [facility];  Service: Open Heart Surgery;  Laterality: N/A;    Family History  Problem Relation Age of Onset   Heart disease Mother     Social History   Socioeconomic History   Marital status: Married    Spouse name: Not on file   Number of children: Not on file   Years of education: Not on file   Highest education level: Not on file  Occupational History   Not on file  Tobacco Use   Smoking status: Former    Types: Cigarettes    Quit date: 07/22/1998    Years since quitting: 22.5   Smokeless tobacco: Never  Substance and Sexual Activity   Alcohol use: Yes    Drug use: No   Sexual activity: Not on file  Other Topics Concern   Not on file  Social History Narrative   Not on file   Social Determinants of Health   Financial Resource Strain: Not on file  Food Insecurity: Not on file  Transportation Needs: Not on file  Physical Activity: Not on file  Stress: Not on file  Social Connections: Not on file  Intimate Partner Violence: Not on file    ROS Review of Systems  All other systems reviewed and are negative.  Objective:   Today's Vitals: BP 122/83 (BP Location: Right Arm, Patient Position: Sitting, Cuff Size: Normal)   Pulse 84   Temp (!) 97.3 F (36.3 C) (Temporal)   Ht '5\' 7"'  (1.702 m)   Wt 173 lb 12.8 oz (78.8 kg)   SpO2 93%   BMI 27.22 kg/m    Physical exam: General: Vital signs reviewed.  Patient is well-developed and well-nourished, overweight male in no acute distress and cooperative with exam. Head: Normocephalic and atraumatic. Eyes: EOMI, conjunctivae normal, no scleral icterus. Neck: Supple, trachea midline, normal ROM, no JVD, masses, thyromegaly, or carotid bruit present. Cardiovascular: RRR, S1 normal, S2 normal, no murmurs, gallops, or rubs. Pulmonary/Chest: Clear to auscultation bilaterally, no wheezes, rales, or rhonchi. Abdominal: Soft, non-tender, non-distended, BS +, no masses, organomegaly, or guarding present. Musculoskeletal: No joint deformities, erythema, or stiffness, ROM full and nontender. Extremities: No lower extremity edema  bilaterally,  pulses symmetric and intact bilaterally. No cyanosis or clubbing. Neurological: A&O x3, Strength is normal Skin: Warm, dry and intact. No rashes or erythema. Psychiatric: Normal mood and affect. speech and behavior is normal. Cognition and memory are normal.    Assessment & Plan:  Diagnoses and all orders for this visit:  Essential hypertension Counseled on blood pressure goal of less than 130/80, low-sodium, DASH diet, medication compliance, 150 minutes of  moderate intensity exercise per week. Discussed medication compliance, adverse effects.  -     CMP14+EGFR  Hyperlipidemia LDL goal <70 Your cholesterol higher than expected. High cholesterol may increase risk of heart attack and/or stroke. Consider eating more fruits, vegetables, and lean baked meats such as chicken or fish. Moderate intensity exercise at least 150 minutes as tolerated per week may help as well.   -     Lipid Panel  Type 2 diabetes mellitus with hyperosmolarity without coma, without long-term current use of insulin (HCC) Monitor foods that are high in carbohydrates are the following rice, potatoes, breads, sugars, and pastas.  Reduction in the intake (eating) will assist in lowering your blood sugars.  -     CBC with Differential -     POCT glycosylated hemoglobin (Hb A1C)  Encounter to establish care Establish care with PCP  Need for immunization against influenza -     Flu Vaccine QUAD 65moIM (Fluarix, Fluzone & Alfiuria Quad PF)  Colon cancer screening -     Ambulatory referral to Gastroenterology  Encounter for diabetic foot exam ([place] Completed   Other orders -     atorvastatin (LIPITOR) 80 MG tablet; Take 1 tablet (80 mg total) by mouth daily at 6 PM. -     hydrochlorothiazide (HYDRODIURIL) 25 MG tablet; hydrochlorothiazide 25 mg tablet  TOMA UNA TABLETA POR LA BOCA DIARIA PARA LA PRESION ARTERIAL -     losartan (COZAAR) 100 MG tablet; losartan 100 mg tablet  TOMA UNA TABLETA POR LA BOCA DIARIA -     metoprolol tartrate (LOPRESSOR) 50 MG tablet; Take 1 tablet (50 mg total) by mouth 2 (two) times daily. -     metFORMIN (GLUCOPHAGE) 1000 MG tablet; Take 1 tablet (1,000 mg total) by mouth 2 (two) times daily.   Outpatient Encounter Medications as of 01/19/2021  Medication Sig   aspirin EC 81 MG EC tablet Take 1 tablet (81 mg total) by mouth daily.   [DISCONTINUED] atorvastatin (LIPITOR) 80 MG tablet Take 1 tablet (80 mg total) by mouth daily at 6 PM.    [DISCONTINUED] hydrochlorothiazide (HYDRODIURIL) 25 MG tablet hydrochlorothiazide 25 mg tablet  TOMA UNA TABLETA POR LA BOCA DIARIA PARA LA PRESION ARTERIAL   [DISCONTINUED] losartan (COZAAR) 100 MG tablet losartan 100 mg tablet  TOMA UNA TABLETA POR LA BOCA DIARIA   [DISCONTINUED] metFORMIN (GLUCOPHAGE) 1000 MG tablet Take 1,000 mg by mouth 2 (two) times daily.   [DISCONTINUED] metoprolol tartrate (LOPRESSOR) 50 MG tablet Take 1 tablet (50 mg total) by mouth 2 (two) times daily.   atorvastatin (LIPITOR) 80 MG tablet Take 1 tablet (80 mg total) by mouth daily at 6 PM.   hydrochlorothiazide (HYDRODIURIL) 25 MG tablet hydrochlorothiazide 25 mg tablet  TOMA UNA TABLETA POR LA BOCA DIARIA PARA LA PRESION ARTERIAL   losartan (COZAAR) 100 MG tablet losartan 100 mg tablet  TOMA UNA TABLETA POR LA BOCA DIARIA   metFORMIN (GLUCOPHAGE) 1000 MG tablet Take 1 tablet (1,000 mg total) by mouth 2 (two) times daily.   metoprolol tartrate (  LOPRESSOR) 50 MG tablet Take 1 tablet (50 mg total) by mouth 2 (two) times daily.   [DISCONTINUED] acetaminophen (TYLENOL) 500 MG tablet Take 1 tablet (500 mg total) by mouth every 6 (six) hours as needed.   [DISCONTINUED] influenza vac split quadrivalent PF (AFLURIA QUADRIVALENT) 0.5 ML injection Afluria Qd 2020-21 (36 mos up)(PF)60 mcg (15 mcg x4)/0.5 mL IM syringe  INJECT 0.5 ML INTO THE MUSCLE ONCE   No facility-administered encounter medications on file as of 01/19/2021.    Follow-up: Return in about 3 months (around 04/21/2021) for DM.   Kerin Perna, NP

## 2021-01-21 LAB — CMP14+EGFR
ALT: 38 IU/L (ref 0–44)
AST: 22 IU/L (ref 0–40)
Albumin/Globulin Ratio: 2 (ref 1.2–2.2)
Albumin: 4.7 g/dL (ref 3.8–4.9)
Alkaline Phosphatase: 96 IU/L (ref 44–121)
BUN/Creatinine Ratio: 11 (ref 9–20)
BUN: 10 mg/dL (ref 6–24)
Bilirubin Total: 0.7 mg/dL (ref 0.0–1.2)
CO2: 21 mmol/L (ref 20–29)
Calcium: 9.5 mg/dL (ref 8.7–10.2)
Chloride: 98 mmol/L (ref 96–106)
Creatinine, Ser: 0.93 mg/dL (ref 0.76–1.27)
Globulin, Total: 2.4 g/dL (ref 1.5–4.5)
Glucose: 156 mg/dL — ABNORMAL HIGH (ref 70–99)
Potassium: 3.4 mmol/L — ABNORMAL LOW (ref 3.5–5.2)
Sodium: 139 mmol/L (ref 134–144)
Total Protein: 7.1 g/dL (ref 6.0–8.5)
eGFR: 95 mL/min/{1.73_m2} (ref >59)

## 2021-01-21 LAB — CBC WITH DIFFERENTIAL/PLATELET
Basophils Absolute: 0.1 10*3/uL (ref 0.0–0.2)
Basos: 1 %
EOS (ABSOLUTE): 0.2 10*3/uL (ref 0.0–0.4)
Eos: 2 %
Hematocrit: 41.2 % (ref 37.5–51.0)
Hemoglobin: 13.9 g/dL (ref 13.0–17.7)
Immature Grans (Abs): 0 10*3/uL (ref 0.0–0.1)
Immature Granulocytes: 0 %
Lymphocytes Absolute: 2.2 10*3/uL (ref 0.7–3.1)
Lymphs: 29 %
MCH: 30.8 pg (ref 26.6–33.0)
MCHC: 33.7 g/dL (ref 31.5–35.7)
MCV: 91 fL (ref 79–97)
Monocytes Absolute: 0.6 10*3/uL (ref 0.1–0.9)
Monocytes: 7 %
Neutrophils Absolute: 4.6 10*3/uL (ref 1.4–7.0)
Neutrophils: 61 %
Platelets: 168 10*3/uL (ref 150–450)
RBC: 4.51 x10E6/uL (ref 4.14–5.80)
RDW: 11.8 % (ref 11.6–15.4)
WBC: 7.6 10*3/uL (ref 3.4–10.8)

## 2021-01-21 LAB — LIPID PANEL
Chol/HDL Ratio: 3.7 ratio (ref 0.0–5.0)
Cholesterol, Total: 132 mg/dL (ref 100–199)
HDL: 36 mg/dL — ABNORMAL LOW (ref >39)
LDL Chol Calc (NIH): 77 mg/dL (ref 0–99)
Triglycerides: 104 mg/dL (ref 0–149)
VLDL Cholesterol Cal: 19 mg/dL (ref 5–40)

## 2021-01-24 ENCOUNTER — Other Ambulatory Visit (INDEPENDENT_AMBULATORY_CARE_PROVIDER_SITE_OTHER): Payer: Self-pay | Admitting: Primary Care

## 2021-01-24 MED ORDER — POTASSIUM CHLORIDE CRYS ER 10 MEQ PO TBCR: 10.0000 meq | EXTENDED_RELEASE_TABLET | Freq: Once | ORAL | 0 refills | Status: DC

## 2021-01-28 ENCOUNTER — Telehealth (INDEPENDENT_AMBULATORY_CARE_PROVIDER_SITE_OTHER): Payer: Self-pay

## 2021-01-28 ENCOUNTER — Other Ambulatory Visit (INDEPENDENT_AMBULATORY_CARE_PROVIDER_SITE_OTHER): Payer: Self-pay | Admitting: Primary Care

## 2021-01-28 MED ORDER — POTASSIUM CHLORIDE CRYS ER 10 MEQ PO TBCR: 10.0000 meq | EXTENDED_RELEASE_TABLET | Freq: Every day | ORAL | 1 refills | Status: DC

## 2021-01-28 NOTE — Telephone Encounter (Signed)
-----   Message from [name], NP sent at 01/24/2021  4:46 PM EDT ----- Increase exercising, potassium slightly below normal . Sent in K+ take daily for 1 month. All labs are normal

## 2021-01-28 NOTE — Telephone Encounter (Signed)
Call placed to patient with assistance of pacific interpreter [phone]). Patient aware of low potassium and Rx being sent to pharmacy. Take daily for 1 month. All other labs normal. Her verbalized understanding. Maryjean Morn, CMA

## 2021-03-07 ENCOUNTER — Ambulatory Visit: Payer: No Typology Code available for payment source | Admitting: Cardiology

## 2021-04-18 ENCOUNTER — Other Ambulatory Visit: Payer: Self-pay

## 2021-04-18 ENCOUNTER — Telehealth (INDEPENDENT_AMBULATORY_CARE_PROVIDER_SITE_OTHER): Payer: No Typology Code available for payment source | Admitting: Cardiology

## 2021-04-18 ENCOUNTER — Encounter: Payer: Self-pay | Admitting: Cardiology

## 2021-04-18 VITALS — Ht 67.0 in | Wt 178.0 lb

## 2021-04-18 DIAGNOSIS — I9789 Other postprocedural complications and disorders of the circulatory system, not elsewhere classified: Secondary | ICD-10-CM

## 2021-04-18 DIAGNOSIS — I10 Essential (primary) hypertension: Secondary | ICD-10-CM | POA: Diagnosis not present

## 2021-04-18 DIAGNOSIS — E785 Hyperlipidemia, unspecified: Secondary | ICD-10-CM

## 2021-04-18 DIAGNOSIS — I4891 Unspecified atrial fibrillation: Secondary | ICD-10-CM

## 2021-04-18 DIAGNOSIS — I2510 Atherosclerotic heart disease of native coronary artery without angina pectoris: Secondary | ICD-10-CM

## 2021-04-18 MED ORDER — LOSARTAN POTASSIUM 100 MG PO TABS: 100.0000 mg | ORAL_TABLET | Freq: Every day | ORAL | 3 refills | Status: DC

## 2021-04-18 MED ORDER — ATORVASTATIN CALCIUM 80 MG PO TABS: 80.0000 mg | ORAL_TABLET | Freq: Every day | ORAL | 3 refills | Status: DC

## 2021-04-18 MED ORDER — POTASSIUM CHLORIDE CRYS ER 10 MEQ PO TBCR: 10.0000 meq | EXTENDED_RELEASE_TABLET | Freq: Every day | ORAL | 3 refills | Status: DC

## 2021-04-18 MED ORDER — EZETIMIBE 10 MG PO TABS: 10.0000 mg | ORAL_TABLET | Freq: Every day | ORAL | 3 refills | Status: DC

## 2021-04-18 MED ORDER — METOPROLOL TARTRATE 50 MG PO TABS: 50.0000 mg | ORAL_TABLET | Freq: Two times a day (BID) | ORAL | 3 refills | Status: DC

## 2021-04-18 MED ORDER — HYDROCHLOROTHIAZIDE 25 MG PO TABS: 25.0000 mg | ORAL_TABLET | Freq: Every day | ORAL | 3 refills | Status: DC

## 2021-04-18 NOTE — Patient Instructions (Signed)
Medication Instructions:  START Zetia 10mg  once daily ALL refills for other medications have been sent into your pharmacy on file.  Lab Work: BMET in 1 week ( scheduled for 1/24) Lipids and ALT in 8 weeks (scheduled for 3/13)  Testing/Procedures: NONE   Follow-Up: At [facility], you and your health needs are our priority.  As part of our continuing mission to provide you with exceptional heart care, we have created designated Provider Care Teams.  These Care Teams include your primary Cardiologist (physician) and Advanced Practice Providers (APPs -  Physician Assistants and Nurse Practitioners) who all work together to provide you with the care you need, when you need it.  We recommend signing up for the patient portal called "MyChart".  Sign up information is provided on this After Visit Summary.  MyChart is used to connect with patients for Virtual Visits (Telemedicine).  Patients are able to view lab/test results, encounter notes, upcoming appointments, etc.  Non-urgent messages can be sent to your provider as well.   To learn more about what you can do with MyChart, go to [email or web address].    Your next appointment:   1 year(s)  The format for your next appointment:   In Person  Provider:   Fransico Him, MD

## 2021-04-18 NOTE — Addendum Note (Signed)
Addended by: Lars Mage on: 04/18/2021 09:45 AM   Modules accepted: Orders

## 2021-04-18 NOTE — Progress Notes (Signed)
Virtual Visit via Video Note   This visit type was conducted due to national recommendations for restrictions regarding the COVID-19 Pandemic (e.g. social distancing) in an effort to limit this patient's exposure and mitigate transmission in our community.  Due to his co-morbid illnesses, this patient is at least at moderate risk for complications without adequate follow up.  This format is felt to be most appropriate for this patient at this time.  All issues noted in this document were discussed and addressed.  A limited physical exam was performed with this format.  Please refer to the patient's chart for his consent to telehealth for [facility].   Date:  04/18/2021   ID:  [name], DOB 03/30/1961, MRN [id number] The patient was identified using 2 identifiers.  Patient Location: Home Provider Location: Home Office   PCP:  Kerin Perna, NP   [facility] Providers Cardiologist:  Fransico Him, MD     Evaluation Performed:  Follow-Up Visit  Chief Complaint:  CAD, HTN, HLD  History of Present Illness:    [name] is a 61 y.o. male with  a hx of NSTEMI found to have severe three-vessel coronary artery disease and underwent CABG x5 with LIMA to the LAD, SVG to diag in June 2018 showed no recurrent atrial fibrillation.  His amiodarone and apixaban were stopped.  He is here today for followup and is doing well.  The history has been obtained via an interpreter.  He has had some twitching sensations in his chest but not really any pain.  He denies any anginal chest pain or pressure, SOB, DOE, PND, orthopnea, LE edema, palpitations or syncope. Occasionally he will have some mild dizziness. He is compliant with his meds and is tolerating meds with no SE.     The patient does not have symptoms concerning for COVID-19 infection (fever, chills, cough, or new shortness of breath).    Past Medical History:  Diagnosis Date   CAD (coronary artery disease), native coronary  artery 09/20/2017   Diabetes mellitus without complication ([place])    Hyperlipidemia    Hypertension    Past Surgical History:  Procedure Laterality Date   CORONARY ARTERY BYPASS GRAFT N/A 07/23/2017   Procedure: CORONARY ARTERY BYPASS GRAFTING (CABG) x Five , using left internal mammary artery and right leg greater saphenous vein harvested endoscopically;  Surgeon: Grace Isaac, MD;  Location: [facility];  Service: Open Heart Surgery;  Laterality: N/A;   LEFT HEART CATH AND CORONARY ANGIOGRAPHY N/A 07/22/2017   Procedure: LEFT HEART CATH AND CORONARY ANGIOGRAPHY;  Surgeon: Martinique, Peter M, MD;  Location: [facility];  Service: Cardiovascular;  Laterality: N/A;   TEE WITHOUT CARDIOVERSION N/A 07/23/2017   Procedure: TRANSESOPHAGEAL ECHOCARDIOGRAM (TEE);  Surgeon: Grace Isaac, MD;  Location: [address];  Service: Open Heart Surgery;  Laterality: N/A;     Current Meds  Medication Sig   acetaminophen (TYLENOL) 325 MG tablet Take 650 mg by mouth every 6 (six) hours as needed.   aspirin EC 81 MG EC tablet Take 1 tablet (81 mg total) by mouth daily.   atorvastatin (LIPITOR) 80 MG tablet Take 1 tablet (80 mg total) by mouth daily at 6 PM.   hydrochlorothiazide (HYDRODIURIL) 25 MG tablet hydrochlorothiazide 25 mg tablet  TOMA UNA TABLETA POR LA BOCA DIARIA PARA LA PRESION ARTERIAL   losartan (COZAAR) 100 MG tablet losartan 100 mg tablet  TOMA UNA TABLETA POR LA BOCA DIARIA   metFORMIN (GLUCOPHAGE) 1000 MG  tablet Take 1 tablet (1,000 mg total) by mouth 2 (two) times daily.   metoprolol tartrate (LOPRESSOR) 50 MG tablet Take 1 tablet (50 mg total) by mouth 2 (two) times daily.     Allergies:   Tape   Social History   Tobacco Use   Smoking status: Former    Types: Cigarettes    Quit date: 07/22/1998    Years since quitting: 22.7   Smokeless tobacco: Never  Substance Use Topics   Alcohol use: Yes   Drug use: No     Family Hx: The patient's family history includes Heart disease in  his mother.  ROS:   Please see the history of present illness.     All other systems reviewed and are negative.   Prior CV studies:   The following studies were reviewed today:  none  Labs/Other Tests and Data Reviewed:    EKG:  No ECG reviewed.  Recent Labs: 01/19/2021: ALT 38; BUN 10; Creatinine, Ser 0.93; Hemoglobin 13.9; Platelets 168; Potassium 3.4; Sodium 139   Recent Lipid Panel Lab Results  Component Value Date/Time   CHOL 132 01/19/2021 10:36 AM   TRIG 104 01/19/2021 10:36 AM   HDL 36 (L) 01/19/2021 10:36 AM   CHOLHDL 3.7 01/19/2021 10:36 AM   CHOLHDL 3.4 07/22/2017 03:03 AM   LDLCALC 77 01/19/2021 10:36 AM    Wt Readings from Last 3 Encounters:  04/18/21 178 lb (80.7 kg)  01/19/21 173 lb 12.8 oz (78.8 kg)  02/12/20 173 lb 12.8 oz (78.8 kg)     Risk Assessment/Calculations:          Objective:    Vital Signs:  Ht 5\' 7"  (1.702 m)    Wt 178 lb (80.7 kg)    BMI 27.88 kg/m    VITAL SIGNS:  reviewed GEN:  no acute distress EYES:  sclerae anicteric, EOMI - Extraocular Movements Intact RESPIRATORY:  normal respiratory effort, symmetric expansion CARDIOVASCULAR:  no peripheral edema SKIN:  no rash, lesions or ulcers. MUSCULOSKELETAL:  no obvious deformities. NEURO:  alert and oriented x 3, no obvious focal deficit PSYCH:  normal affect  ASSESSMENT & PLAN:    1.  ASCAD -severe 3V coronary artery disease and underwent CABG x5 with LIMA to the LAD, SVG to diagonal, SVG to OM1 and OM 2, SVG to RCA 07/24/2017. -He is doing well and has had no angina since I saw him last -Continue prescription drug management with aspirin 81 mg daily, Lopressor 50 mg twice daily and atorvastatin 80 mg daily with as needed refills    2.  HTN -BP has been fairly well controlled at home. -Continue prescription drug management with HCTZ 25 mg daily, losartan 100 mg daily and metoprolol 50 mg twice daily with as needed refills -I have personally reviewed and interpreted outside  labs performed by patient's PCP which showed serum creatinine of 0.93 and potassium 3.4 -I will repeat BMET in 1 week  to make sure potassium is okay>>he ran out of his potassium>>will refill today   3.  Postop afib -occurred after CABG -He has not had any further palpitations   4.  HLD -LDL goal < 70 -I have personally reviewed and interpreted outside labs performed by patient's PCP which showed LDL 77, HDL 36, triglycerides 104 on 01/19/2021  -Continue prescription drug management with atorvastatin 80 mg daily with as needed refills -And Zetia 10 mg daily and repeat FLP and ALT in 6 to 8 weeks     COVID-19 Education:  The signs and symptoms of COVID-19 were discussed with the patient and how to seek care for testing (follow up with PCP or arrange E-visit).  The importance of social distancing was discussed today.  Time:   Today, I have spent 20 minutes with the patient with telehealth technology discussing the above problems.     Medication Adjustments/Labs and Tests Ordered: Current medicines are reviewed at length with the patient today.  Concerns regarding medicines are outlined above.   Tests Ordered: No orders of the defined types were placed in this encounter.   Medication Changes: No orders of the defined types were placed in this encounter.   Follow Up:  In Person in 1 year(s)  Signed, Fransico Him, MD  04/18/2021 8:52 AM    [place]

## 2021-04-21 ENCOUNTER — Other Ambulatory Visit: Payer: Self-pay

## 2021-04-21 ENCOUNTER — Ambulatory Visit (INDEPENDENT_AMBULATORY_CARE_PROVIDER_SITE_OTHER): Payer: No Typology Code available for payment source | Admitting: Primary Care

## 2021-04-26 ENCOUNTER — Other Ambulatory Visit: Payer: No Typology Code available for payment source | Admitting: *Deleted

## 2021-04-26 ENCOUNTER — Other Ambulatory Visit: Payer: Self-pay

## 2021-04-26 DIAGNOSIS — E785 Hyperlipidemia, unspecified: Secondary | ICD-10-CM

## 2021-04-26 DIAGNOSIS — I2510 Atherosclerotic heart disease of native coronary artery without angina pectoris: Secondary | ICD-10-CM

## 2021-04-26 LAB — BASIC METABOLIC PANEL
BUN/Creatinine Ratio: 16 (ref 9–20)
BUN: 18 mg/dL (ref 6–24)
CO2: 26 mmol/L (ref 20–29)
Calcium: 9.9 mg/dL (ref 8.7–10.2)
Chloride: 100 mmol/L (ref 96–106)
Creatinine, Ser: 1.11 mg/dL (ref 0.76–1.27)
Glucose: 126 mg/dL — ABNORMAL HIGH (ref 70–99)
Potassium: 4.3 mmol/L (ref 3.5–5.2)
Sodium: 140 mmol/L (ref 134–144)
eGFR: 77 mL/min/{1.73_m2} (ref >59)

## 2021-06-13 ENCOUNTER — Other Ambulatory Visit: Payer: No Typology Code available for payment source

## 2021-06-13 ENCOUNTER — Other Ambulatory Visit: Payer: Self-pay

## 2021-06-13 DIAGNOSIS — E785 Hyperlipidemia, unspecified: Secondary | ICD-10-CM

## 2021-06-13 DIAGNOSIS — I2510 Atherosclerotic heart disease of native coronary artery without angina pectoris: Secondary | ICD-10-CM

## 2021-06-13 LAB — LIPID PANEL
Chol/HDL Ratio: 2.5 ratio (ref 0.0–5.0)
Cholesterol, Total: 85 mg/dL — ABNORMAL LOW (ref 100–199)
HDL: 34 mg/dL — ABNORMAL LOW (ref >39)
LDL Chol Calc (NIH): 34 mg/dL (ref 0–99)
Triglycerides: 83 mg/dL (ref 0–149)
VLDL Cholesterol Cal: 17 mg/dL (ref 5–40)

## 2021-06-13 LAB — ALT: ALT: 24 IU/L (ref 0–44)

## 2021-08-18 ENCOUNTER — Other Ambulatory Visit (INDEPENDENT_AMBULATORY_CARE_PROVIDER_SITE_OTHER): Payer: Self-pay | Admitting: Primary Care

## 2021-08-18 NOTE — Telephone Encounter (Signed)
Requested medication (s) are due for refill today: yes  Requested medication (s) are on the active medication list: yes  Last refill:  01/19/21 #180 1 refills  Future visit scheduled: yes in 3 weeks  Notes to clinic:  protocol failed last labs 07/21/2017. Can patient get courtesy refill? Called requesting medication     Requested Prescriptions  Pending Prescriptions Disp Refills   metFORMIN (GLUCOPHAGE) 1000 MG tablet 180 tablet 1    Sig: Take 1 tablet (1,000 mg total) by mouth 2 (two) times daily.     Endocrinology:  Diabetes - Biguanides Failed - 08/18/2021  4:06 PM      Failed - HBA1C is between 0 and 7.9 and within 180 days    Hgb A1c MFr Bld  Date Value Ref Range Status  07/21/2017 6.7 (H) 4.8 - 5.6 % Final    Comment:    (NOTE) Pre diabetes:          5.7%-6.4% Diabetes:              >6.4% Glycemic control for   <7.0% adults with diabetes          Failed - B12 Level in normal range and within 720 days    No results found for: VITAMINB12       Failed - Valid encounter within last 6 months    Recent Outpatient Visits           7 months ago Essential hypertension   [place], [place] P, NP       Future Appointments             In 3 weeks Oletta Lamas, Milford Cage, NP [place] in normal range and within 360 days    Creatinine, Ser  Date Value Ref Range Status  04/26/2021 1.11 0.76 - 1.27 mg/dL Final         Passed - eGFR in normal range and within 360 days    GFR calc Af Amer  Date Value Ref Range Status  02/12/2020 99 >59 mL/min/1.73 Final    Comment:    **In accordance with recommendations from the NKF-ASN Task force,**   [facility] is in the process of updating its eGFR calculation to the   2021 CKD-EPI creatinine equation that estimates kidney function   without a race variable.    GFR calc non Af Amer  Date Value Ref Range Status  02/12/2020 85 >59 mL/min/1.73 Final    eGFR  Date Value Ref Range Status  04/26/2021 77 >59 mL/min/1.73 Final         Passed - CBC within normal limits and completed in the last 12 months    WBC  Date Value Ref Range Status  01/19/2021 7.6 3.4 - 10.8 x10E3/uL Final  08/06/2017 12.5 (H) 4.0 - 10.5 K/uL Final   RBC  Date Value Ref Range Status  01/19/2021 4.51 4.14 - 5.80 x10E6/uL Final  08/06/2017 3.68 (L) 4.22 - 5.81 MIL/uL Final   Hemoglobin  Date Value Ref Range Status  01/19/2021 13.9 13.0 - 17.7 g/dL Final   Hematocrit  Date Value Ref Range Status  01/19/2021 41.2 37.5 - 51.0 % Final   MCHC  Date Value Ref Range Status  01/19/2021 33.7 31.5 - 35.7 g/dL Final  08/06/2017 33.7 30.0 - 36.0 g/dL Final   [facility]  Date Value Ref Range Status  01/19/2021 30.8  26.6 - 33.0 pg Final  08/06/2017 31.3 26.0 - 34.0 pg Final   MCV  Date Value Ref Range Status  01/19/2021 91 79 - 97 fL Final   No results found for: PLTCOUNTKUC, LABPLAT, POCPLA RDW  Date Value Ref Range Status  01/19/2021 11.8 11.6 - 15.4 % Final

## 2021-08-18 NOTE — Telephone Encounter (Signed)
Routed request to PCP ?

## 2021-08-18 NOTE — Telephone Encounter (Signed)
Medication Refill - Medication:metFORMIN (GLUCOPHAGE) 1000 MG tablet   Has the patient contacted their pharmacy? yes (Agent: If no, request that the patient contact the pharmacy for the refill. If patient does not wish to contact the pharmacy document the reason why and proceed with request.) (Agent: If yes, when and what did the pharmacy advise?)contact pcp  Preferred Pharmacy (with phone number or street name): [facility] - [place], [place] - [address]  Phone:  [phone] Fax:  [phone]   Has the patient been seen for an appointment in the last year OR does the patient have an upcoming appointment? yes  Agent: Please be advised that RX refills may take up to 3 business days. We ask that you follow-up with your pharmacy.

## 2021-09-08 ENCOUNTER — Encounter (INDEPENDENT_AMBULATORY_CARE_PROVIDER_SITE_OTHER): Payer: Self-pay | Admitting: Primary Care

## 2021-09-08 ENCOUNTER — Ambulatory Visit (INDEPENDENT_AMBULATORY_CARE_PROVIDER_SITE_OTHER): Payer: Self-pay | Admitting: Primary Care

## 2021-09-08 VITALS — BP 123/80 | HR 66 | Temp 98.4°F | Ht 67.0 in | Wt 184.6 lb

## 2021-09-08 DIAGNOSIS — Z23 Encounter for immunization: Secondary | ICD-10-CM

## 2021-09-08 DIAGNOSIS — E1100 Type 2 diabetes mellitus with hyperosmolarity without nonketotic hyperglycemic-hyperosmolar coma (NKHHC): Secondary | ICD-10-CM

## 2021-09-08 LAB — POCT GLYCOSYLATED HEMOGLOBIN (HGB A1C): Hemoglobin A1C: 6.7 % — AB (ref 4.0–5.6)

## 2021-09-08 MED ORDER — METFORMIN HCL ER 500 MG PO TB24: 500.0000 mg | ORAL_TABLET | Freq: Every day | ORAL | 1 refills | Status: DC

## 2021-09-08 NOTE — Patient Instructions (Signed)
Vacuna Tdap (ttanos, difteria y tos ferina): lo que debe saber Tdap (Tetanus, Diphtheria, Pertussis) Vaccine: What You Need to Know 1. Por qu vacunarse? La vacuna Tdap puede prevenir el ttanos, la difteria y la tos ferina. La difteria y la tos ferina se contagian de persona a persona. El ttanos ingresa al organismo a travs de cortes o heridas. El TTANOS (T) provoca rigidez dolorosa en los msculos. El ttanos puede causar graves problemas de salud, como no poder abrir la boca, tener dificultad para tragar y respirar, o la muerte. La DIFTERIA (D) puede causar dificultad para respirar, insuficiencia cardaca, parlisis o muerte. La TOS FERINA (aP), tambin conocida como "tos convulsa", puede causar tos violenta e incontrolable lo que hace difcil respirar, comer o beber. La tos ferina puede ser muy grave, especialmente en los bebs y en los nios pequeos, y causar neumona, convulsiones, dao cerebral o la muerte. En adolescentes y adultos, puede causar prdida de peso, prdida del control de la vejiga, desmayos y fracturas de costillas al toser de manera intensa. 2. Vacuna Tdap La vacuna Tdap es solo para nios de 7 aos en adelante, adolescentes y adultos.  Los adolescentes deben recibir una dosis nica de la vacuna Tdap, preferentemente a los 11 o 12 aos. Las personas embarazadas deben recibir una dosis de la vacuna Tdap durante cada embarazo, preferiblemente durante la primera parte del tercer trimestre, para ayudar a proteger al recin nacido de la tos ferina. Los bebs tienen mayor riesgo de sufrir complicaciones graves y potencialmente mortales debido a la tos ferina. Los adultos que nunca recibieron la vacuna Tdap deben recibir una dosis. Adems, los adultos deben recibir una dosis de refuerzo de Tdap o Td (una vacuna diferente que protege contra el ttanos y la difteria, pero no contra la tos ferina) cada 10 aos, o despus de 5 aos en caso de herida o quemadura grave o sucia. La  vacuna Tdap puede ser administrada al mismo tiempo que otras vacunas. 3. Hable con el mdico Comunquese con la persona que le coloca las vacunas si la persona que la recibe: Ha tenido una reaccin alrgica despus de una dosis anterior de cualquier vacuna contra el ttanos, la difteria o la tos ferina, o cualquier alergia grave, potencialmente mortal Ha tenido un coma, disminucin del nivel de la conciencia o convulsiones prolongadas dentro de los 7 das posteriores a una dosis anterior de cualquier vacuna contra la tos ferina (DTP, DTaP o Tdap) Tiene convulsiones u otro problema del sistema nervioso Alguna vez tuvo sndrome de Guillain-Barr (tambin llamado "SGB") Ha tenido dolor intenso o hinchazn despus de una dosis anterior de cualquier vacuna contra el ttanos o la difteria En algunos casos, es posible que el mdico decida posponer la aplicacin de la vacuna Tdap para una visita en el futuro. Las personas que sufren trastornos menores, como un resfro, pueden vacunarse. Las personas que tienen enfermedades moderadas o graves generalmente deben esperar hasta recuperarse para poder recibir la vacuna Tdap.  Su mdico puede darle ms informacin. 4. Riesgos de una reaccin a la vacuna Despus de recibir la vacuna Tdap a veces se puede tener dolor, enrojecimiento o hinchazn en el lugar donde se aplic la inyeccin, fiebre leve, dolor de cabeza, sensacin de cansancio y nuseas, vmitos, diarrea o dolor de estmago. Las personas a veces se desmayan despus de procedimientos mdicos, incluida la vacunacin. Informe al mdico si se siente mareado, tiene cambios en la visin o zumbidos en los odos.  Al igual que con cualquier medicamento,   existe una probabilidad muy remota de que una vacuna cause una reaccin alrgica grave, otra lesin grave o la muerte. 5. Qu pasa si se presenta un problema grave? Podra producirse una reaccin alrgica despus de que la persona vacunada abandone la clnica. Si  observa signos de una reaccin alrgica grave (ronchas, hinchazn de la cara y la garganta, dificultad para respirar, latidos cardacos acelerados, mareos o debilidad), llame al 9-1-1 y lleve a la persona al hospital ms cercano. Si se presentan otros signos que le preocupan, comunquese con su mdico.  Las reacciones adversas deben informarse al Sistema de Informe de Eventos Adversos de Vacunas (VAERS). Por lo general, el mdico presenta este informe o puede hacerlo usted mismo. Visite el sitio web del VAERS en www.vaers.hhs.gov o llame al 1-800-822-7967. El VAERS es solo para informar reacciones, y los miembros de su personal no proporcionan asesoramiento mdico. 6. Programa Nacional de Compensacin de Daos por Vacunas El Programa Nacional de Compensacin de Daos por Vacunas (National Vaccine Injury Compensation Program, VICP) es un programa federal que fue creado para compensar a las personas que puedan haber sufrido daos al recibir ciertas vacunas. Las reclamaciones relativas a presuntas lesiones o muerte debidas a la vacunacin tienen un lmite de tiempo para su presentacin, que puede ser de tan solo dos aos. Visite el sitio web del VICP en www.hrsa.gov/vaccinecompensation o llame al 1-800-338-2382 para obtener ms informacin acerca del programa y de cmo presentar un reclamo. 7. Cmo puedo obtener ms informacin? Pregntele a su mdico. Comunquese con el servicio de salud de su localidad o su estado. Visite el sitio web de la Food and Drug Administration (FDA) (Administracin de Alimentos y Medicamentos) para ver los prospectos de las vacunas e informacin adicional en www.fda.gov/vaccines-blood-biologics/vaccines. Comunquese con los Centers for Disease Control and Prevention (CDC) (Centros para el Control y la Prevencin de Enfermedades): Llame al 1-800-232-4636 (1-800-CDC-INFO) o Visite el sitio web de los CDC en www.cdc.gov/vaccines. Fuente: Declaracin de informacin de los CDC sobre  la vacuna Tdap (ttanos, difteria y tos ferina) (11/07/2019) Este mismo material est disponible en www.cdc.gov sin cargo. Esta informacin no tiene como fin reemplazar el consejo del mdico. Asegrese de hacerle al mdico cualquier pregunta que tenga. Document Revised: 03/08/2021 Document Reviewed: 12/20/2020 Elsevier Patient Education  2023 Elsevier Inc.  

## 2021-09-08 NOTE — Progress Notes (Signed)
Subjective:  Patient ID: [name], male    DOB: 03-18-1961  Age: 61 y.o. MRN: [id number]  CC: Diabetes   HPI [name] presents for follow-up of a Hispanic male 61 y/o with diabetes. [facility] [place] [phone]) Patient does check blood sugar at home.  Compliant with meds - Yes Checking CBGs? Yes  Fasting avg - 120-130  Postprandial average -  Exercising regularly? - Yes Watching carbohydrate intake? - Yes Neuropathy ? - No Hypoglycemic events - No  - Recovers with :   Pertinent ROS:  Polyuria - No Polydipsia - No Vision problems - No  Medications as noted below. Taking them regularly without complication/adverse reaction being reported today.   History [name] has a past medical history of CAD (coronary artery disease), native coronary artery (09/20/2017), Diabetes mellitus without complication (HCC), Hyperlipidemia, and Hypertension.   [name] has a past surgical history that includes LEFT HEART CATH AND CORONARY ANGIOGRAPHY (N/A, 07/22/2017); Coronary artery bypass graft (N/A, 07/23/2017); and TEE without cardioversion (N/A, 07/23/2017).   His family history includes Heart disease in his mother.[name] reports that [name] quit smoking about 23 years ago. His smoking use included cigarettes. [name] has never used smokeless tobacco. [name] reports current alcohol use. [name] reports that [name] does not use drugs.  Current Outpatient Medications on File Prior to Visit  Medication Sig Dispense Refill  . acetaminophen (TYLENOL) 325 MG tablet Take 650 mg by mouth every 6 (six) hours as needed.    Marland Kitchen aspirin EC 81 MG EC tablet Take 1 tablet (81 mg total) by mouth daily.    Marland Kitchen atorvastatin (LIPITOR) 80 MG tablet Take 1 tablet (80 mg total) by mouth daily at 6 PM. 90 tablet 3  . ezetimibe (ZETIA) 10 MG tablet Take 1 tablet (10 mg total) by mouth daily. 90 tablet 3  . hydrochlorothiazide (HYDRODIURIL) 25 MG tablet Take 1 tablet (25 mg total) by mouth daily. 90 tablet 3  . losartan (COZAAR) 100 MG tablet  Take 1 tablet (100 mg total) by mouth daily. 90 tablet 3  . metFORMIN (GLUCOPHAGE) 1000 MG tablet Take 1 tablet (1,000 mg total) by mouth 2 (two) times daily. 180 tablet 1  . metoprolol tartrate (LOPRESSOR) 50 MG tablet Take 1 tablet (50 mg total) by mouth 2 (two) times daily. 180 tablet 3  . potassium chloride (KLOR-CON M) 10 MEQ tablet Take 1 tablet (10 mEq total) by mouth daily. 90 tablet 3   No current facility-administered medications on file prior to visit.    ROS Comprehensive ROS Pertinent positive and negative noted in HPI    Objective:  BP 123/80   Pulse 66   Temp 98.4 F (36.9 C) (Oral)   Ht 5\' 7"  (1.702 m)   Wt 184 lb 9.6 oz (83.7 kg)   SpO2 96%   BMI 28.91 kg/m   BP Readings from Last 3 Encounters:  09/08/21 123/80  01/19/21 122/83  02/12/20 (!) 160/84    Wt Readings from Last 3 Encounters:  09/08/21 184 lb 9.6 oz (83.7 kg)  04/18/21 178 lb (80.7 kg)  01/19/21 173 lb 12.8 oz (78.8 kg)    Physical Exam Vitals reviewed.  Constitutional:      Appearance: Normal appearance.  HENT:     Head: Normocephalic.     Right Ear: Tympanic membrane and external ear normal.     Left Ear: Tympanic membrane and external ear normal.     Nose: Nose normal.  Eyes:     Pupils: Pupils are  equal, round, and reactive to light.  Cardiovascular:     Rate and Rhythm: Normal rate and regular rhythm.  Pulmonary:     Effort: Pulmonary effort is normal.     Breath sounds: Normal breath sounds.  Abdominal:     General: Bowel sounds are normal. There is distension.     Palpations: Abdomen is soft.  Musculoskeletal:        General: Normal range of motion.     Cervical back: Normal range of motion and neck supple.  Skin:    General: Skin is warm and dry.  Neurological:     Mental Status: [name] is alert and oriented to person, place, and time.  Psychiatric:        Mood and Affect: Mood normal.        Behavior: Behavior normal.        Thought Content: Thought content normal.    Lab Results  Component Value Date   HGBA1C 6.7 (H) 07/21/2017   HGBA1C (H) 07/06/2008    6.5 (NOTE)   The ADA recommends the following therapeutic goal for glycemic   control related to Hgb A1C measurement:   Goal of Therapy:   < 7.0% Hgb A1C   Reference: American Diabetes Association: Clinical Practice   Recommendations 2008, Diabetes Care,  2008, 31:(Suppl 1).   HGBA1C (H) 07/06/2008    6.4 (NOTE)   The ADA recommends the following therapeutic goal for glycemic   control related to Hgb A1C measurement:   Goal of Therapy:   < 7.0% Hgb A1C   Reference: American Diabetes Association: Clinical Practice   Recommendations 2008, Diabetes Care,  2008, 31:(Suppl 1).    Lab Results  Component Value Date   WBC 7.6 01/19/2021   HGB 13.9 01/19/2021   HCT 41.2 01/19/2021   PLT 168 01/19/2021   GLUCOSE 126 (H) 04/26/2021   CHOL 85 (L) 06/13/2021   TRIG 83 06/13/2021   HDL 34 (L) 06/13/2021   LDLCALC 34 06/13/2021   ALT 24 06/13/2021   AST 22 01/19/2021   NA 140 04/26/2021   K 4.3 04/26/2021   CL 100 04/26/2021   CREATININE 1.11 04/26/2021   BUN 18 04/26/2021   CO2 26 04/26/2021   TSH 5.572 (H) 07/29/2017   INR 1.46 07/23/2017   HGBA1C 6.7 (H) 07/21/2017     Assessment & Plan:   [name] was seen today for diabetes.  Diagnoses and all orders for this visit:  Need for Tdap vaccination -     Tdap vaccine greater than or equal to 7yo IM  Type 2 diabetes mellitus with hyperosmolarity without coma, without long-term current use of insulin (HCC) -     HgB A1c 6.7    I am having [name] maintain his aspirin EC, metFORMIN, acetaminophen, ezetimibe, atorvastatin, hydrochlorothiazide, losartan, metoprolol tartrate, and potassium chloride.  No orders of the defined types were placed in this encounter.    Follow-up:   No follow-ups on file.  The above assessment and management plan was discussed with the patient. The patient verbalized understanding of and has agreed to  the management plan. Patient is aware to call the clinic if symptoms fail to improve or worsen. Patient is aware when to return to the clinic for a follow-up visit. Patient educated on when it is appropriate to go to the emergency department.   Gwinda Passe, NP-C

## 2021-09-12 NOTE — Progress Notes (Incomplete)
Subjective:  Patient ID: [name], male    DOB: 08/23/1960  Age: 61 y.o. MRN: [id number]  CC: Diabetes   HPI [name] presents for follow-up of a Hispanic male 61 y/o with diabetes. [facility] Allena Katz [phone]) Patient does check blood sugar at home.  Compliant with meds - Yes Checking CBGs? Yes  Fasting avg - 120-130  Postprandial average -  Exercising regularly? - Yes Watching carbohydrate intake? - Yes Neuropathy ? - No Hypoglycemic events - No  - Recovers with :   Pertinent ROS:  Polyuria - No Polydipsia - No Vision problems - No  Medications as noted below. Taking them regularly without complication/adverse reaction being reported today.   History [name] has a past medical history of CAD (coronary artery disease), native coronary artery (09/20/2017), Diabetes mellitus without complication ([name]), Hyperlipidemia, and Hypertension.   [name] has a past surgical history that includes LEFT HEART CATH AND CORONARY ANGIOGRAPHY (N/A, 07/22/2017); Coronary artery bypass graft (N/A, 07/23/2017); and TEE without cardioversion (N/A, 07/23/2017).   His family history includes Heart disease in his mother.[name] reports that [name] quit smoking about 23 years ago. His smoking use included cigarettes. [name] has never used smokeless tobacco. [name] reports current alcohol use. [name] reports that [name] does not use drugs.  Current Outpatient Medications on File Prior to Visit  Medication Sig Dispense Refill  . acetaminophen (TYLENOL) 325 MG tablet Take 650 mg by mouth every 6 (six) hours as needed.    Marland Kitchen aspirin EC 81 MG EC tablet Take 1 tablet (81 mg total) by mouth daily.    Marland Kitchen atorvastatin (LIPITOR) 80 MG tablet Take 1 tablet (80 mg total) by mouth daily at 6 PM. 90 tablet 3  . ezetimibe (ZETIA) 10 MG tablet Take 1 tablet (10 mg total) by mouth daily. 90 tablet 3  . hydrochlorothiazide (HYDRODIURIL) 25 MG tablet Take 1 tablet (25 mg total) by mouth daily. 90 tablet 3  . losartan (COZAAR) 100 MG tablet  Take 1 tablet (100 mg total) by mouth daily. 90 tablet 3  . metoprolol tartrate (LOPRESSOR) 50 MG tablet Take 1 tablet (50 mg total) by mouth 2 (two) times daily. 180 tablet 3  . potassium chloride (KLOR-CON M) 10 MEQ tablet Take 1 tablet (10 mEq total) by mouth daily. 90 tablet 3   No current facility-administered medications on file prior to visit.    ROS Comprehensive ROS Pertinent positive and negative noted in HPI    Objective:  BP 123/80   Pulse 66   Temp 98.4 F (36.9 C) (Oral)   Ht 5\' 7"  (1.702 m)   Wt 184 lb 9.6 oz (83.7 kg)   SpO2 96%   BMI 28.91 kg/m   BP Readings from Last 3 Encounters:  09/08/21 123/80  01/19/21 122/83  02/12/20 (!) 160/84    Wt Readings from Last 3 Encounters:  09/08/21 184 lb 9.6 oz (83.7 kg)  04/18/21 178 lb (80.7 kg)  01/19/21 173 lb 12.8 oz (78.8 kg)    Physical Exam Vitals reviewed.  Constitutional:      Appearance: Normal appearance.  HENT:     Head: Normocephalic.     Right Ear: Tympanic membrane and external ear normal.     Left Ear: Tympanic membrane and external ear normal.     Nose: Nose normal.  Eyes:     Pupils: Pupils are equal, round, and reactive to light.  Cardiovascular:     Rate and Rhythm: Normal rate and regular rhythm.  Pulmonary:  Effort: Pulmonary effort is normal.     Breath sounds: Normal breath sounds.  Abdominal:     General: Bowel sounds are normal. There is distension.     Palpations: Abdomen is soft.  Musculoskeletal:        General: Normal range of motion.     Cervical back: Normal range of motion and neck supple.  Skin:    General: Skin is warm and dry.  Neurological:     Mental Status: [name] is alert and oriented to person, place, and time.  Psychiatric:        Mood and Affect: Mood normal.        Behavior: Behavior normal.        Thought Content: Thought content normal.    Lab Results  Component Value Date   HGBA1C 6.7 (A) 09/08/2021   HGBA1C 6.7 (H) 07/21/2017   HGBA1C (H)  07/06/2008    6.5 (NOTE)   The ADA recommends the following therapeutic goal for glycemic   control related to Hgb A1C measurement:   Goal of Therapy:   < 7.0% Hgb A1C   Reference: American Diabetes Association: Clinical Practice   Recommendations 2008, Diabetes Care,  2008, 31:(Suppl 1).    Lab Results  Component Value Date   WBC 7.6 01/19/2021   HGB 13.9 01/19/2021   HCT 41.2 01/19/2021   PLT 168 01/19/2021   GLUCOSE 126 (H) 04/26/2021   CHOL 85 (L) 06/13/2021   TRIG 83 06/13/2021   HDL 34 (L) 06/13/2021   LDLCALC 34 06/13/2021   ALT 24 06/13/2021   AST 22 01/19/2021   NA 140 04/26/2021   K 4.3 04/26/2021   CL 100 04/26/2021   CREATININE 1.11 04/26/2021   BUN 18 04/26/2021   CO2 26 04/26/2021   TSH 5.572 (H) 07/29/2017   INR 1.46 07/23/2017   HGBA1C 6.7 (A) 09/08/2021     Assessment & Plan:   [name] was seen today for diabetes.  Diagnoses and all orders for this visit:  Need for Tdap vaccination -     Tdap vaccine greater than or equal to 7yo IM  Type 2 diabetes mellitus with hyperosmolarity without coma, without long-term current use of insulin (HCC) -     HgB A1c 6.7 same for one year change metformin to 500XR daily Continue to monitor foods that are high in carbohydrates are the following rice, potatoes, breads, sugars, and pastas.  Reduction in the intake (eating) will assist in lowering your blood sugars.    I have discontinued [name]'s metFORMIN. I am also having him start on metFORMIN. Additionally, I am having him maintain his aspirin EC, acetaminophen, ezetimibe, atorvastatin, hydrochlorothiazide, losartan, metoprolol tartrate, and potassium chloride.  Meds ordered this encounter  Medications  . metFORMIN (GLUCOPHAGE-XR) 500 MG 24 hr tablet    Sig: Take 1 tablet (500 mg total) by mouth daily with breakfast.    Dispense:  90 tablet    Refill:  1     Follow-up:   Return in about 6 months (around 03/10/2022) for DM.  The above assessment and  management plan was discussed with the patient. The patient verbalized understanding of and has agreed to the management plan. Patient is aware to call the clinic if symptoms fail to improve or worsen. Patient is aware when to return to the clinic for a follow-up visit. Patient educated on when it is appropriate to go to the emergency department.   Juluis Mire, NP-C

## 2021-11-03 ENCOUNTER — Encounter: Payer: Self-pay | Admitting: Cardiology

## 2021-11-03 NOTE — Telephone Encounter (Signed)
error 

## 2022-01-09 ENCOUNTER — Other Ambulatory Visit: Payer: Self-pay | Admitting: Cardiology

## 2022-01-09 DIAGNOSIS — E785 Hyperlipidemia, unspecified: Secondary | ICD-10-CM

## 2022-01-09 DIAGNOSIS — I2510 Atherosclerotic heart disease of native coronary artery without angina pectoris: Secondary | ICD-10-CM

## 2022-02-10 ENCOUNTER — Other Ambulatory Visit (INDEPENDENT_AMBULATORY_CARE_PROVIDER_SITE_OTHER): Payer: Self-pay | Admitting: Primary Care

## 2022-02-10 DIAGNOSIS — E1100 Type 2 diabetes mellitus with hyperosmolarity without nonketotic hyperglycemic-hyperosmolar coma (NKHHC): Secondary | ICD-10-CM

## 2022-02-10 NOTE — Telephone Encounter (Signed)
Refilled 09/08/2021 #90 1 rf. Requested Prescriptions  Pending Prescriptions Disp Refills   metFORMIN (GLUCOPHAGE-XR) 500 MG 24 hr tablet [Pharmacy Med Name: METFORMIN HCL ER 500 MG ORAL TABLET EXTENDED RELEASE 24 HOUR] 90 tablet 1    Sig: TAKE 1 TABLET (500 MG TOTAL) BY MOUTH DAILY WITH BREAKFAST.     Endocrinology:  Diabetes - Biguanides Failed - 02/10/2022  3:30 PM      Failed - B12 Level in normal range and within 720 days    No results found for: "VITAMINB12"       Failed - CBC within normal limits and completed in the last 12 months    WBC  Date Value Ref Range Status  01/19/2021 7.6 3.4 - 10.8 x10E3/uL Final  08/06/2017 12.5 (H) 4.0 - 10.5 K/uL Final   RBC  Date Value Ref Range Status  01/19/2021 4.51 4.14 - 5.80 x10E6/uL Final  08/06/2017 3.68 (L) 4.22 - 5.81 MIL/uL Final   Hemoglobin  Date Value Ref Range Status  01/19/2021 13.9 13.0 - 17.7 g/dL Final   Hematocrit  Date Value Ref Range Status  01/19/2021 41.2 37.5 - 51.0 % Final   MCHC  Date Value Ref Range Status  01/19/2021 33.7 31.5 - 35.7 g/dL Final  08/06/2017 33.7 30.0 - 36.0 g/dL Final   [facility]  Date Value Ref Range Status  01/19/2021 30.8 26.6 - 33.0 pg Final  08/06/2017 31.3 26.0 - 34.0 pg Final   MCV  Date Value Ref Range Status  01/19/2021 91 79 - 97 fL Final   No results found for: "PLTCOUNTKUC", "LABPLAT", "POCPLA" RDW  Date Value Ref Range Status  01/19/2021 11.8 11.6 - 15.4 % Final         Passed - Cr in normal range and within 360 days    Creatinine, Ser  Date Value Ref Range Status  04/26/2021 1.11 0.76 - 1.27 mg/dL Final         Passed - HBA1C is between 0 and 7.9 and within 180 days    Hemoglobin A1C  Date Value Ref Range Status  09/08/2021 6.7 (A) 4.0 - 5.6 % Final   Hgb A1c MFr Bld  Date Value Ref Range Status  07/21/2017 6.7 (H) 4.8 - 5.6 % Final    Comment:    (NOTE) Pre diabetes:          5.7%-6.4% Diabetes:              >6.4% Glycemic control for   <7.0% adults with  diabetes          Passed - eGFR in normal range and within 360 days    GFR calc Af Amer  Date Value Ref Range Status  02/12/2020 99 >59 mL/min/1.73 Final    Comment:    **In accordance with recommendations from the NKF-ASN Task force,**   [facility] is in the process of updating its eGFR calculation to the   2021 CKD-EPI creatinine equation that estimates kidney function   without a race variable.    GFR calc non Af Amer  Date Value Ref Range Status  02/12/2020 85 >59 mL/min/1.73 Final   eGFR  Date Value Ref Range Status  04/26/2021 77 >59 mL/min/1.73 Final         Passed - Valid encounter within last 6 months    Recent Outpatient Visits           5 months ago Need for Tdap vaccination   [place], Michelle P,  NP   1 year ago Essential hypertension   [facility] Kerin Perna, NP       Future Appointments             In 1 month Oletta Lamas, Milford Cage, NP [place]

## 2022-02-11 ENCOUNTER — Other Ambulatory Visit: Payer: Self-pay | Admitting: Cardiology

## 2022-02-11 DIAGNOSIS — I2510 Atherosclerotic heart disease of native coronary artery without angina pectoris: Secondary | ICD-10-CM

## 2022-02-11 DIAGNOSIS — I10 Essential (primary) hypertension: Secondary | ICD-10-CM

## 2022-03-14 ENCOUNTER — Ambulatory Visit (INDEPENDENT_AMBULATORY_CARE_PROVIDER_SITE_OTHER): Payer: Self-pay | Admitting: Primary Care

## 2022-05-17 ENCOUNTER — Ambulatory Visit (INDEPENDENT_AMBULATORY_CARE_PROVIDER_SITE_OTHER): Payer: BLUE CROSS/BLUE SHIELD | Admitting: Primary Care

## 2022-05-17 ENCOUNTER — Encounter (INDEPENDENT_AMBULATORY_CARE_PROVIDER_SITE_OTHER): Payer: Self-pay | Admitting: Primary Care

## 2022-05-17 ENCOUNTER — Other Ambulatory Visit: Payer: Self-pay | Admitting: Cardiology

## 2022-05-17 VITALS — BP 129/77 | HR 64 | Resp 16 | Ht 67.0 in | Wt 187.2 lb

## 2022-05-17 DIAGNOSIS — Z23 Encounter for immunization: Secondary | ICD-10-CM

## 2022-05-17 DIAGNOSIS — Z1159 Encounter for screening for other viral diseases: Secondary | ICD-10-CM

## 2022-05-17 DIAGNOSIS — I2510 Atherosclerotic heart disease of native coronary artery without angina pectoris: Secondary | ICD-10-CM

## 2022-05-17 DIAGNOSIS — E1100 Type 2 diabetes mellitus with hyperosmolarity without nonketotic hyperglycemic-hyperosmolar coma (NKHHC): Secondary | ICD-10-CM

## 2022-05-17 DIAGNOSIS — I10 Essential (primary) hypertension: Secondary | ICD-10-CM

## 2022-05-17 DIAGNOSIS — E785 Hyperlipidemia, unspecified: Secondary | ICD-10-CM

## 2022-05-17 LAB — POCT GLYCOSYLATED HEMOGLOBIN (HGB A1C): HbA1c, POC (controlled diabetic range): 9.8 % — AB (ref 0.0–7.0)

## 2022-05-17 MED ORDER — METFORMIN HCL ER 500 MG PO TB24: 500.0000 mg | ORAL_TABLET | Freq: Two times a day (BID) | ORAL | 1 refills | Status: DC

## 2022-05-17 MED ORDER — GLIPIZIDE 10 MG PO TABS: 10.0000 mg | ORAL_TABLET | Freq: Two times a day (BID) | ORAL | 1 refills | Status: DC

## 2022-05-17 MED ORDER — METFORMIN HCL ER (MOD) 1000 MG PO TB24: 1000.0000 mg | ORAL_TABLET | Freq: Two times a day (BID) | ORAL | 1 refills | Status: DC

## 2022-05-17 NOTE — Progress Notes (Signed)
[name], is a 62 y.o. male  [id number]  [id number]  DOB - [birth year]  Chief Complaint  Patient presents with   Diabetes   Hypertension      [name] [id number] Subjective:   [name] is a 62 y.o. male here today for a follow up visit for management of T2D. Checking CBGs? Yes weekly  Fasting avg - 120-228 Exercising regularly? - Yes/ walking  Watching carbohydrate intake? - Yes Neuropathy ? - No Hypoglycemic events - No Pertinent ROS:  Polyuria - No Polydipsia - No Vision problems - No  Patient has No headache, No chest pain, No abdominal pain - No Nausea, No new weakness tingling or numbness, No Cough - shortness of breath  No problems updated.  Allergies  Allergen Reactions   Tape     Past Medical History:  Diagnosis Date   CAD (coronary artery disease), native coronary artery 09/20/2017   Diabetes mellitus without complication ([place])    Hyperlipidemia    Hypertension     Current Outpatient Medications on File Prior to Visit  Medication Sig Dispense Refill   acetaminophen (TYLENOL) 325 MG tablet Take 650 mg by mouth every 6 (six) hours as needed.     aspirin EC 81 MG EC tablet Take 1 tablet (81 mg total) by mouth daily.     atorvastatin (LIPITOR) 80 MG tablet Take 1 tablet (80 mg total) by mouth daily at 6 PM. 90 tablet 3   ezetimibe (ZETIA) 10 MG tablet TAKE 1 TABLET (10 MG TOTAL) BY MOUTH DAILY. 90 tablet 0   hydrochlorothiazide (HYDRODIURIL) 25 MG tablet Take 1 tablet (25 mg total) by mouth daily. 90 tablet 3   losartan (COZAAR) 100 MG tablet Take 1 tablet (100 mg total) by mouth daily. 90 tablet 3   metFORMIN (GLUCOPHAGE-XR) 500 MG 24 hr tablet Take 1 tablet (500 mg total) by mouth daily with breakfast. 90 tablet 1   metoprolol tartrate (LOPRESSOR) 50 MG tablet TAKE 1 TABLET (50 MG TOTAL) BY MOUTH 2 (TWO) TIMES DAILY. 180 tablet 0   potassium chloride (KLOR-CON) 10 MEQ tablet TAKE 1 TABLET (10 MEQ TOTAL) BY MOUTH  DAILY. 90 tablet 0   No current facility-administered medications on file prior to visit.    Objective:   Vitals:   05/17/22 1444  BP: 129/77  Pulse: 64  Resp: 16  SpO2: 97%  Weight: 187 lb 3.2 oz (84.9 kg)  Height: 5' 7"$  (1.702 m)    Comprehensive ROS Pertinent positive and negative noted in HPI   Exam General appearance : Awake, alert, not in any distress. Speech Clear. Not toxic looking HEENT: Atraumatic and Normocephalic, pupils equally reactive to light and accomodation Neck: Supple, no JVD. No cervical lymphadenopathy.  Chest: Good air entry bilaterally, no added sounds  CVS: S1 S2 regular, no murmurs.  Abdomen: Bowel sounds present, Non tender and not distended with no gaurding, rigidity or rebound. Extremities: B/L Lower Ext shows no edema, both legs are warm to touch Neurology: Awake alert, and oriented X 3, CN II-XII intact, Non focal Skin: No Rash  Data Review Lab Results  Component Value Date   HGBA1C 6.7 (A) 09/08/2021   HGBA1C 6.7 (H) 07/21/2017   HGBA1C (H) 07/06/2008    6.5 (NOTE)   The ADA recommends the following therapeutic goal for glycemic   control related to Hgb A1C measurement:   Goal of Therapy:   < 7.0% Hgb A1C   Reference: American Diabetes Association: Clinical Practice  Recommendations 2008, Diabetes Care,  2008, 31:(Suppl 1).    Assessment & Plan  [name] was seen today for diabetes and hypertension.  Diagnoses and all orders for this visit:  Need for immunization against influenza -     POCT glycosylated hemoglobin (Hb A1C) -     Flu Vaccine QUAD 32moIM (Fluarix, Fluzone & Alfiuria Quad PF)  Type 2 diabetes mellitus with hyperosmolarity without coma, without long-term current use of insulin ([place] - educated on lifestyle modifications, including but not limited to diet choices and adding exercise to daily routine.   -     CBC with Differential/Platelet -     CMP14+EGFR -     Microalbumin / creatinine urine ratio -     Discontinue:  metFORMIN (GLUCOPHAGE-XR) 500 MG 24 hr tablet; Take 1 tablet (500 mg total) by mouth 2 (two) times daily with a meal. -     glipiZIDE (GLUCOTROL) 10 MG tablet; Take 1 tablet (10 mg total) by mouth 2 (two) times daily before a meal. -     Discontinue: metFORMIN (GLUMETZA) 1000 MG (MOD) 24 hr tablet; Take 1 tablet (1,000 mg total) by mouth 2 (two) times daily with a meal.  Encounter for HCV screening test for low risk patient -     HCV Ab w Reflex to Quant PCR  Other orders -     Interpretation:  Patient have been counseled extensively about nutrition and exercise. Other issues discussed during this visit include: low cholesterol diet, weight control and daily exercise, foot care, annual eye examinations at Ophthalmology, importance of adherence with medications and regular follow-up. We also discussed long term complications of uncontrolled diabetes and hypertension.   No follow-ups on file.  The patient was given clear instructions to go to ER or return to medical center if symptoms don't improve, worsen or new problems develop. The patient verbalized understanding. The patient was told to call to get lab results if they haven't heard anything in the next week.   This note has been created with DSurveyor, quantity Any transcriptional errors are unintentional.   MKerin Perna NP 05/17/2022, 2:56 PM

## 2022-05-18 ENCOUNTER — Telehealth (INDEPENDENT_AMBULATORY_CARE_PROVIDER_SITE_OTHER): Payer: Self-pay | Admitting: Primary Care

## 2022-05-18 LAB — CBC WITH DIFFERENTIAL/PLATELET
Basophils Absolute: 0.1 10*3/uL (ref 0.0–0.2)
Basos: 1 %
EOS (ABSOLUTE): 0.2 10*3/uL (ref 0.0–0.4)
Eos: 2 %
Hematocrit: 41.4 % (ref 37.5–51.0)
Hemoglobin: 14.2 g/dL (ref 13.0–17.7)
Immature Grans (Abs): 0 10*3/uL (ref 0.0–0.1)
Immature Granulocytes: 0 %
Lymphocytes Absolute: 2.7 10*3/uL (ref 0.7–3.1)
Lymphs: 34 %
MCH: 31.9 pg (ref 26.6–33.0)
MCHC: 34.3 g/dL (ref 31.5–35.7)
MCV: 93 fL (ref 79–97)
Monocytes Absolute: 0.7 10*3/uL (ref 0.1–0.9)
Monocytes: 8 %
Neutrophils Absolute: 4.5 10*3/uL (ref 1.4–7.0)
Neutrophils: 55 %
Platelets: 174 10*3/uL (ref 150–450)
RBC: 4.45 x10E6/uL (ref 4.14–5.80)
RDW: 12.1 % (ref 11.6–15.4)
WBC: 8.1 10*3/uL (ref 3.4–10.8)

## 2022-05-18 LAB — HCV INTERPRETATION

## 2022-05-18 LAB — CMP14+EGFR
ALT: 20 IU/L (ref 0–44)
AST: 15 IU/L (ref 0–40)
Albumin/Globulin Ratio: 1.5 (ref 1.2–2.2)
Albumin: 4.1 g/dL (ref 3.8–4.9)
Alkaline Phosphatase: 91 IU/L (ref 44–121)
BUN/Creatinine Ratio: 16 (ref 9–20)
BUN: 19 mg/dL (ref 6–24)
Bilirubin Total: 0.9 mg/dL (ref 0.0–1.2)
CO2: 22 mmol/L (ref 20–29)
Calcium: 9.5 mg/dL (ref 8.7–10.2)
Chloride: 100 mmol/L (ref 96–106)
Creatinine, Ser: 1.18 mg/dL (ref 0.76–1.27)
Globulin, Total: 2.8 g/dL (ref 1.5–4.5)
Glucose: 159 mg/dL — ABNORMAL HIGH (ref 70–99)
Potassium: 3.9 mmol/L (ref 3.5–5.2)
Sodium: 140 mmol/L (ref 134–144)
Total Protein: 6.9 g/dL (ref 6.0–8.5)
eGFR: 71 mL/min/{1.73_m2} (ref >59)

## 2022-05-18 LAB — MICROALBUMIN / CREATININE URINE RATIO
Creatinine, Urine: 95.5 mg/dL
Microalb/Creat Ratio: 10 mg/g creat (ref 0–29)
Microalbumin, Urine: 9.2 ug/mL

## 2022-05-18 LAB — HCV AB W REFLEX TO QUANT PCR: HCV Ab: NONREACTIVE

## 2022-05-18 NOTE — Telephone Encounter (Signed)
Attempt to call daughter back.  The medication that they are requesting was not prescribed by PCP but by their Caridologist. Unable to reach patient, no voicemail at number left.

## 2022-05-18 NOTE — Telephone Encounter (Signed)
Christy Sartorius with  [facility] called needing clarification on the Metformin.  They have two dif prescriptions.  CB#  [phone]

## 2022-05-18 NOTE — Telephone Encounter (Signed)
Pt daughter states pharmacy states that pt does not have any refills for his medication and pt daughter states pt should have 3 refills.  atorvastatin (LIPITOR) 80 MG tablet  hydrochlorothiazide (HYDRODIURIL) 25 MG  losartan (COZAAR) 100 MG tablet   metFORMIN (GLUMETZA) 1000 MG (MOD) 24 hr table (pharmacy need clarification on the dosage for this medication)    [place], [place] Phone: [phone]  Fax: [phone]       Please advise.

## 2022-05-18 NOTE — Telephone Encounter (Signed)
Pt daughter is calling back upset and stated pt does not have any medication left and needs his medication sent in today.Daughter is requesting a call back today and is asking if it's okay that the patient won't be taking his medication. If pt is going to be okay, I advised I would send a message to pt PCP daughter hung up.   Please advise.

## 2022-05-18 NOTE — Telephone Encounter (Signed)
Will forward to provider  

## 2022-05-19 ENCOUNTER — Telehealth (INDEPENDENT_AMBULATORY_CARE_PROVIDER_SITE_OTHER): Payer: Self-pay

## 2022-05-19 ENCOUNTER — Other Ambulatory Visit: Payer: Self-pay

## 2022-05-19 ENCOUNTER — Ambulatory Visit (INDEPENDENT_AMBULATORY_CARE_PROVIDER_SITE_OTHER): Payer: Self-pay | Admitting: *Deleted

## 2022-05-19 ENCOUNTER — Other Ambulatory Visit: Payer: Self-pay | Admitting: Pharmacist

## 2022-05-19 DIAGNOSIS — E785 Hyperlipidemia, unspecified: Secondary | ICD-10-CM

## 2022-05-19 DIAGNOSIS — I2510 Atherosclerotic heart disease of native coronary artery without angina pectoris: Secondary | ICD-10-CM

## 2022-05-19 MED ORDER — METFORMIN HCL ER 500 MG PO TB24: 1000.0000 mg | ORAL_TABLET | Freq: Two times a day (BID) | ORAL | 1 refills | Status: DC

## 2022-05-19 MED ORDER — ATORVASTATIN CALCIUM 80 MG PO TABS: 80.0000 mg | ORAL_TABLET | Freq: Every day | ORAL | 0 refills | Status: DC

## 2022-05-19 NOTE — Telephone Encounter (Signed)
Daughter called to ask to clarify to pharmacist to Metformin that was ordered today. [facility] [employer] and spoke to [place] who stated the order was clarified already and will be refilled. Called daughter back [name]) and advised her of the same.

## 2022-05-19 NOTE — Telephone Encounter (Signed)
Summary: med ?   Merny from [employer] called in, needs correct dosage  of med, Meformin. They received 2 scrips, one for 500 mg and one for 1055m.       Per chart: metFORMIN (GLUMETZA) 1000 MG (MOD) 24 hr tablet Take 1 tablet (1,000 mg total) by mouth 2 (two) times daily with a meal. Dispense: 180 tablet, Refills: 1 ordered   05/17/2022   Call to pharmacy- patient insurance does not cover long acting Glumetza- please resend Rx with alternative- regular metformin with instructions  Reason for Disposition . [1] Pharmacy calling with prescription question AND [2] triager unable to answer question  Answer Assessment - Initial Assessment Questions 1. NAME of MEDICINE: "What medicine(s) are you calling about?"     Glumetza 2. QUESTION: "What is your question?" (e.g., double dose of medicine, side effect)     Long acting Rx not covered by insurance 3. PRESCRIBER: "Who prescribed the medicine?" Reason: if prescribed by specialist, call should be referred to that group.     PCP  Protocols used: Medication Question Call-A-AH

## 2022-05-19 NOTE — Telephone Encounter (Signed)
[name] is something you can take care of?

## 2022-05-19 NOTE — Telephone Encounter (Signed)
Pt's medication was sent to pt's pharmacy as requested. Confirmation received.  °

## 2022-05-19 NOTE — Telephone Encounter (Signed)
  Chief Complaint: alternative Rx requested  Disposition: []$ ED /[]$ Urgent Care (no appt availability in office) / []$ Appointment(In office/virtual)/ []$  [place] [facility]/ []$ Home Care/ []$ Refused Recommended Disposition /[]$ [place] [facility]/ [x]$  Follow-up with PCP Additional Notes: Call to pharmacy- long acting Glumetza is not covered by patient insurance- please send another Rx with covered medication

## 2022-05-22 ENCOUNTER — Telehealth (INDEPENDENT_AMBULATORY_CARE_PROVIDER_SITE_OTHER): Payer: Self-pay

## 2022-05-22 NOTE — Telephone Encounter (Signed)
[place]  Id# [phone]  contacted pt to go over lab results pt is aware and doesn't have any questions or concerns

## 2022-05-23 ENCOUNTER — Encounter (INDEPENDENT_AMBULATORY_CARE_PROVIDER_SITE_OTHER): Payer: Self-pay | Admitting: Primary Care

## 2022-05-23 ENCOUNTER — Ambulatory Visit (INDEPENDENT_AMBULATORY_CARE_PROVIDER_SITE_OTHER): Payer: Self-pay | Admitting: Primary Care

## 2022-06-21 NOTE — Progress Notes (Signed)
    S:    PCP: Juluis Mire  62 y.o. male who presents for diabetes evaluation, education, and management. PMH is significant for NSTEMI s/p CABG x5, HTN, CAD, HLD, and T2DM .   Patient was referred and last seen by Primary Care Provider, Juluis Mire, on 05/17/2022. A1c was elevated at 9.8% at that time, previously 6.7%. Metformin dose was increased and glipizide was initiated.   Today, patient arrives in good spirits and presents without any assistance. Visit was conducted with a Optometrist. He denies any side effects from his medications.   Family/Social History:  -Fhx: CVD -Tobacco: former (quit 2000) -Alcohol: endorses  Current diabetes medications include: metformin 1000 mg BID, glipizide 10 mg BID Current hypertension medications include: HCTZ 25 mg once daily, metoprolol tartrate 50 mg BID, losartan 100 mg once daily  Current hyperlipidemia medications include: atorvastatin 80 mg once daily, ezetimibe 10 mg once daily  Patient reports adherence to taking all medications as prescribed.   Insurance coverage: BCBS  Patient denies hypoglycemic events.  Reported home fasting blood sugars: checking once weekly - reported readings 125, 95, and 96  Patient denies nocturia (nighttime urination).  Patient denies neuropathy (nerve pain). Patient denies visual changes. Patient denies self foot exams.   Patient reported dietary habits:  - eats twice daily  - drinks diet sodas   Patient-reported exercise habits:  - exercise limited d/t knee pain   O:   Lab Results  Component Value Date   HGBA1C 9.8 (A) 05/17/2022   There were no vitals filed for this visit.  Lipid Panel     Component Value Date/Time   CHOL 85 (L) 06/13/2021 0801   TRIG 83 06/13/2021 0801   HDL 34 (L) 06/13/2021 0801   CHOLHDL 2.5 06/13/2021 0801   CHOLHDL 3.4 07/22/2017 0303   VLDL 12 07/22/2017 0303   LDLCALC 34 06/13/2021 0801    Clinical Atherosclerotic Cardiovascular Disease (ASCVD):  Yes  The ASCVD Risk score (Arnett DK, et al., 2019) failed to calculate for the following reasons:   The patient has a prior MI or stroke diagnosis    A/P: Diabetes longstanding currently close to goal based on home BG readings and A1c today. Patient is able to verbalize appropriate hypoglycemia management plan. Medication adherence appears appropriate.  -Decreased dose of glipizide to 5 mg BID -Started GLP-1 Ozempic (semaglutide) 0.25 mg once weekly -A1c makes patient ineligible for [employer] trial. Will send in an Rx for FSL 3 sensor at this time. Patient will obtain pending insurance affordability.  -Continued metformin 500 mg XR 2 tablets BID.  -Patient educated on purpose, proper use, and potential adverse effects of Ozempic.  -Extensively discussed pathophysiology of diabetes, recommended lifestyle interventions, dietary effects on blood sugar control.  -Counseled on s/sx of and management of hypoglycemia.  -Next A1c anticipated 09/2022.   Written patient instructions provided. Patient verbalized understanding of treatment plan.  Total time in face to face counseling 50 minutes.    Follow-up:  Pharmacist in one month. PCP clinic visit in 08/17/2022  Maryan Puls, PharmD PGY-1 [facility] Resident

## 2022-06-22 ENCOUNTER — Telehealth: Payer: Self-pay | Admitting: Pharmacist

## 2022-06-22 ENCOUNTER — Other Ambulatory Visit: Payer: Self-pay | Admitting: Pharmacist

## 2022-06-22 ENCOUNTER — Other Ambulatory Visit: Payer: Self-pay

## 2022-06-22 ENCOUNTER — Ambulatory Visit: Payer: BLUE CROSS/BLUE SHIELD | Attending: Primary Care | Admitting: Pharmacist

## 2022-06-22 ENCOUNTER — Encounter: Payer: Self-pay | Admitting: Pharmacist

## 2022-06-22 DIAGNOSIS — E1100 Type 2 diabetes mellitus with hyperosmolarity without nonketotic hyperglycemic-hyperosmolar coma (NKHHC): Secondary | ICD-10-CM | POA: Diagnosis not present

## 2022-06-22 LAB — POCT GLYCOSYLATED HEMOGLOBIN (HGB A1C): HbA1c, POC (controlled diabetic range): 7.7 % — AB (ref 0.0–7.0)

## 2022-06-22 MED ORDER — FREESTYLE LIBRE 2 SENSOR MISC: 3 refills | Status: DC

## 2022-06-22 MED ORDER — FREESTYLE LIBRE 2 READER DEVI: 0 refills | Status: DC

## 2022-06-22 MED ORDER — OZEMPIC (0.25 OR 0.5 MG/DOSE) 2 MG/3ML ~~LOC~~ SOPN: 0.2500 mg | PEN_INJECTOR | SUBCUTANEOUS | 0 refills | Status: DC

## 2022-06-22 MED ORDER — GLIPIZIDE 10 MG PO TABS: 10.0000 mg | ORAL_TABLET | Freq: Two times a day (BID) | ORAL | 1 refills | Status: DC

## 2022-06-22 MED ORDER — DEXCOM G7 RECEIVER DEVI: 0 refills | Status: DC

## 2022-06-22 MED ORDER — GLIPIZIDE 5 MG PO TABS: 5.0000 mg | ORAL_TABLET | Freq: Two times a day (BID) | ORAL | 1 refills | Status: DC

## 2022-06-22 MED ORDER — DEXCOM G7 SENSOR MISC: 6 refills | Status: DC

## 2022-06-22 NOTE — Telephone Encounter (Signed)
Spoke to [employer]. Ozempic is >$600 with his insurance d/t an unmet deductible. His blood sugar control is good, we were just adding Ozempic for Cardio benefit. Will hold off on that for now d/t cost and resend for the glipizide 10 mg BID. He will continue this and metformin.

## 2022-06-23 ENCOUNTER — Other Ambulatory Visit: Payer: Self-pay

## 2022-06-27 ENCOUNTER — Other Ambulatory Visit: Payer: Self-pay

## 2022-06-27 ENCOUNTER — Other Ambulatory Visit: Payer: Self-pay | Admitting: Pharmacist

## 2022-06-27 DIAGNOSIS — E1100 Type 2 diabetes mellitus with hyperosmolarity without nonketotic hyperglycemic-hyperosmolar coma (NKHHC): Secondary | ICD-10-CM

## 2022-06-27 MED ORDER — MICROLET LANCETS MISC: 3 refills | Status: AC

## 2022-06-27 MED ORDER — CONTOUR NEXT MONITOR W/DEVICE KIT: PACK | 0 refills | Status: DC

## 2022-06-27 MED ORDER — CONTOUR NEXT TEST VI STRP: ORAL_STRIP | 2 refills | Status: DC

## 2022-07-04 ENCOUNTER — Ambulatory Visit: Payer: BLUE CROSS/BLUE SHIELD | Attending: Cardiology | Admitting: Cardiology

## 2022-07-04 ENCOUNTER — Encounter: Payer: Self-pay | Admitting: Cardiology

## 2022-07-04 VITALS — BP 123/78 | HR 77

## 2022-07-04 DIAGNOSIS — I10 Essential (primary) hypertension: Secondary | ICD-10-CM

## 2022-07-04 DIAGNOSIS — I2510 Atherosclerotic heart disease of native coronary artery without angina pectoris: Secondary | ICD-10-CM | POA: Diagnosis not present

## 2022-07-04 DIAGNOSIS — I4891 Unspecified atrial fibrillation: Secondary | ICD-10-CM

## 2022-07-04 DIAGNOSIS — I9789 Other postprocedural complications and disorders of the circulatory system, not elsewhere classified: Secondary | ICD-10-CM | POA: Diagnosis not present

## 2022-07-04 DIAGNOSIS — E785 Hyperlipidemia, unspecified: Secondary | ICD-10-CM | POA: Diagnosis not present

## 2022-07-04 MED ORDER — ATORVASTATIN CALCIUM 80 MG PO TABS: 80.0000 mg | ORAL_TABLET | Freq: Every day | ORAL | 0 refills | Status: DC

## 2022-07-04 MED ORDER — LOSARTAN POTASSIUM 100 MG PO TABS: 100.0000 mg | ORAL_TABLET | Freq: Every day | ORAL | 0 refills | Status: DC

## 2022-07-04 MED ORDER — EZETIMIBE 10 MG PO TABS: 10.0000 mg | ORAL_TABLET | Freq: Every day | ORAL | 0 refills | Status: DC

## 2022-07-04 MED ORDER — HYDROCHLOROTHIAZIDE 25 MG PO TABS: 25.0000 mg | ORAL_TABLET | Freq: Every day | ORAL | 0 refills | Status: DC

## 2022-07-04 MED ORDER — METOPROLOL TARTRATE 50 MG PO TABS: 50.0000 mg | ORAL_TABLET | Freq: Two times a day (BID) | ORAL | 0 refills | Status: DC

## 2022-07-04 NOTE — Patient Instructions (Signed)
Medication Instructions:  Your physician recommends that you continue on your current medications as directed. Please refer to the Current Medication list given to you today.  *If you need a refill on your cardiac medications before your next appointment, please call your pharmacy*   Lab Work: You are scheduled for FASTING labs on Monday, July 10, 2022. Please arrive to complete testing after you have been fasting for 8 hours.   If you have labs (blood work) drawn today and your tests are completely normal, you will receive your results only by: [place] (if you have MyChart) OR A paper copy in the mail If you have any lab test that is abnormal or we need to change your treatment, we will call you to review the results.   Testing/Procedures: None.   Follow-Up: At [facility], you and your health needs are our priority.  As part of our continuing mission to provide you with exceptional heart care, we have created designated Provider Care Teams.  These Care Teams include your primary Cardiologist (physician) and Advanced Practice Providers (APPs -  Physician Assistants and Nurse Practitioners) who all work together to provide you with the care you need, when you need it.  We recommend signing up for the patient portal called "MyChart".  Sign up information is provided on this After Visit Summary.  MyChart is used to connect with patients for Virtual Visits (Telemedicine).  Patients are able to view lab/test results, encounter notes, upcoming appointments, etc.  Non-urgent messages can be sent to your provider as well.   To learn more about what you can do with MyChart, go to [email or web address].    Your next appointment:   1 year(s)  Provider:   Fransico Him, MD

## 2022-07-04 NOTE — Progress Notes (Signed)
Virtual Visit via Video Note   This visit type was conducted due to national recommendations for restrictions regarding the COVID-19 Pandemic (e.g. social distancing) in an effort to limit this patient's exposure and mitigate transmission in our community.  Due to his co-morbid illnesses, this patient is at least at moderate risk for complications without adequate follow up.  This format is felt to be most appropriate for this patient at this time.  All issues noted in this document were discussed and addressed.  A limited physical exam was performed with this format.  Please refer to the patient's chart for his consent to telehealth for [facility].   Date:  07/04/2022   ID:  [name], DOB [birth year], MRN [id number] The patient was identified using 2 identifiers.  Patient Location: Home Provider Location: Home Office   PCP:  Kenneth Perna, NP   [facility] Providers Cardiologist:  Kenneth Him, MD     Evaluation Performed:  Follow-Up Visit  Chief Complaint:  CAD, HTN, HLD  History of Present Illness:    [name] is a 62 y.o. male with  a hx of NSTEMI found to have severe three-vessel coronary artery disease and underwent CABG x5 with LIMA to the LAD, SVG to diag in June 2018 showed no recurrent atrial fibrillation.  His amiodarone and apixaban were stopped.  He is here today for followup and is doing well.  He denies any chest pain or pressure, SOB, DOE, PND, orthopnea,  palpitations or syncope. He occasionally has some LE edema at the end of a day of work.  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 He occasionally has dizziness if he stands up to fast but has never passed out. He has not been exercising due to knee problems. He is compliant with his meds and is tolerating meds with no SE.    Past Medical History:  Diagnosis Date   CAD (coronary artery disease), native coronary artery 09/20/2017   Diabetes mellitus without complication     Hyperlipidemia    Hypertension    Past Surgical History:  Procedure Laterality Date   CORONARY ARTERY BYPASS GRAFT N/A 07/23/2017   Procedure: CORONARY ARTERY BYPASS GRAFTING (CABG) x Five , using left internal mammary artery and right leg greater saphenous vein harvested endoscopically;  Surgeon: Kenneth Isaac, MD;  Location: [place];  Service: Open Heart Surgery;  Laterality: N/A;   LEFT HEART CATH AND CORONARY ANGIOGRAPHY N/A 07/22/2017   Procedure: LEFT HEART CATH AND CORONARY ANGIOGRAPHY;  Surgeon: Carlson, Kenneth M, MD;  Location: [place] CV LAB;  Service: Cardiovascular;  Laterality: N/A;   TEE WITHOUT CARDIOVERSION N/A 07/23/2017   Procedure: TRANSESOPHAGEAL ECHOCARDIOGRAM (TEE);  Surgeon: Kenneth Isaac, MD;  Location: [place];  Service: Open Heart Surgery;  Laterality: N/A;     No outpatient medications have been marked as taking for the 07/04/22 encounter (Video Visit) with Sueanne Margarita, MD.     Allergies:   Tape   Social History   Tobacco Use   Smoking status: Former    Types: Cigarettes    Quit date: 07/22/1998    Years since quitting: 23.9   Smokeless tobacco: Never  Substance Use Topics   Alcohol use: Yes   Drug use: No     Family Hx: The patient's family history includes Heart disease in his mother.  ROS:   Please see the history of present illness.     All other systems reviewed  and are negative.   Prior CV studies:   The following studies were reviewed today:  none  Labs/Other Tests and Data Reviewed:    EKG:  No ECG reviewed.  Recent Labs: 05/17/2022: ALT 20; BUN 19; Creatinine, Ser 1.18; Hemoglobin 14.2; Platelets 174; Potassium 3.9; Sodium 140   Recent Lipid Panel Lab Results  Component Value Date/Time   CHOL 85 (L) 06/13/2021 08:01 AM   TRIG 83 06/13/2021 08:01 AM   HDL 34 (L) 06/13/2021 08:01 AM   CHOLHDL 2.5 06/13/2021 08:01 AM   CHOLHDL 3.4 07/22/2017 03:03 AM   LDLCALC 34 06/13/2021 08:01 AM    Wt Readings from Last 3  Encounters:  05/17/22 187 lb 3.2 oz (84.9 kg)  09/08/21 184 lb 9.6 oz (83.7 kg)  04/18/21 178 lb (80.7 kg)     Risk Assessment/Calculations:          Objective:    Vital Signs:  BP 123/78   Pulse 77   Well nourished, well developed male in no acute distress. Well appearing, alert and conversant, regular work of breathing,  good skin color  Eyes- anicteric mouth- oral mucosa is pink  neuro- grossly intact skin- no apparent rash or lesions or cyanosis ASSESSMENT & PLAN:    1.  ASCAD -severe 3V coronary artery disease and underwent CABG x5 with LIMA to the LAD, SVG to diagonal, SVG to OM1 and OM 2, SVG to RCA 07/24/2017. -He has not had any chest pain or shortness of breath since I saw Carlson last -Continue prescription drug management with aspirin 81 mg daily, Lopressor 50 mg twice daily and atorvastatin 80 mg daily with as needed refills   2.  HTN -He tells me that his blood pressure has been controlled at home and usually runs about -he will continue on prescription drug management with HCTZ 25 mg daily, losartan 100 mg daily and Lopressor 50 mg twice daily with as needed refills -I have personally reviewed and interpreted outside labs performed by patient's PCP which showed serum creatinine 1.18 and potassium 3.9 on 05/17/2022   3.  Postop afib -occurred after CABG -He denies any palpitations since I saw Carlson last   4.  HLD -LDL goal < 70 -Check FLP and ALT -Continue prescription drug management with atorvastatin 80 mg daily and Zetia 10 mg daily with as needed refills     Time:   Today, I have spent 15 minutes with the patient with telehealth technology discussing the above problems.     Medication Adjustments/Labs and Tests Ordered: Current medicines are reviewed at length with the patient today.  Concerns regarding medicines are outlined above.   Tests Ordered: No orders of the defined types were placed in this encounter.   Medication Changes: No orders of the  defined types were placed in this encounter.   Follow Up:  In Person in 1 year(s)  Signed, Kenneth Him, MD  07/04/2022 8:12 AM    [place]

## 2022-07-04 NOTE — Addendum Note (Signed)
Addended by: Joni Reining on: 07/04/2022 10:19 AM   Modules accepted: Orders

## 2022-07-10 ENCOUNTER — Ambulatory Visit: Payer: BLUE CROSS/BLUE SHIELD | Attending: Cardiology

## 2022-07-10 DIAGNOSIS — E785 Hyperlipidemia, unspecified: Secondary | ICD-10-CM | POA: Diagnosis not present

## 2022-07-11 LAB — LIPID PANEL
Chol/HDL Ratio: 2.5 ratio (ref 0.0–5.0)
Cholesterol, Total: 79 mg/dL — ABNORMAL LOW (ref 100–199)
HDL: 32 mg/dL — ABNORMAL LOW (ref >39)
LDL Chol Calc (NIH): 30 mg/dL (ref 0–99)
Triglycerides: 84 mg/dL (ref 0–149)
VLDL Cholesterol Cal: 17 mg/dL (ref 5–40)

## 2022-07-11 LAB — ALT: ALT: 21 IU/L (ref 0–44)

## 2022-07-12 ENCOUNTER — Telehealth: Payer: Self-pay | Admitting: Cardiology

## 2022-07-12 NOTE — Telephone Encounter (Signed)
Pt's daughter would like a callback regarding pt's results, she's concerned. Please advise.

## 2022-07-13 NOTE — Telephone Encounter (Signed)
Call to patient's daughter Byrd Hesselbach, advised that on patient's most recent release form only daughter Chipper Oman is able to receive patient's health information. Daughter Byrd Hesselbach verbalized understanding and asked that Chipper Oman be called regarding patient's lab work.   Called Chipper Oman and explained per her request that Dr. Mayford Knife feels patient's cholesterol labs are normal and he just needs to continue current medical therapy. Explained advantages to total cholesterol and LDL cholesterol being low, Georgina verbalizes understanding.

## 2022-07-24 ENCOUNTER — Ambulatory Visit: Payer: BLUE CROSS/BLUE SHIELD | Attending: Primary Care | Admitting: Pharmacist

## 2022-07-24 ENCOUNTER — Encounter: Payer: Self-pay | Admitting: Pharmacist

## 2022-07-24 DIAGNOSIS — E1100 Type 2 diabetes mellitus with hyperosmolarity without nonketotic hyperglycemic-hyperosmolar coma (NKHHC): Secondary | ICD-10-CM | POA: Diagnosis not present

## 2022-07-24 MED ORDER — GLIPIZIDE 10 MG PO TABS: 10.0000 mg | ORAL_TABLET | Freq: Every day | ORAL | 3 refills | Status: DC

## 2022-07-24 NOTE — Progress Notes (Signed)
    S:    PCP: [name]  62 y.o. male who presents for diabetes evaluation, education, and management. PMH is significant for NSTEMI s/p CABG x5, HTN, CAD, HLD, and T2DM. Patient was referred and last seen by Primary Care Provider, [name], on 05/17/2022. Last seen by pharmacy clinic on 06/22/2022.   At last visit, patient was started on Ozempic and glipizide was decreased. However, patient has a high deductible plan and Ozempic copay was >$600. Glipizide 10 mg BID was restarted. A1c was 7.7%.   Today, patient arrives in good spirits and presents without any assistance. Visit was conducted with a Nurse, learning disability. Patient notes low BG when he takes glipizide BID, so he decreased to once daily. All BG readings have been 90-110s since decreasing to once daily.   Family/Social History:  -Fhx: CVD -Tobacco: former (quit 2000) -Alcohol: endorses  Current diabetes medications include: metformin 1000 mg BID, glipizide 10 mg once daily (prescribed BID) Current hypertension medications include: HCTZ 25 mg once daily, metoprolol tartrate 50 mg BID, losartan 100 mg once daily  Current hyperlipidemia medications include: atorvastatin 80 mg once daily, ezetimibe 10 mg once daily  Patient reports adherence to taking all medications as prescribed.   Insurance coverage: BCBS  Patient denies hypoglycemic events.  Reported home fasting blood sugars: checking once weekly 69 when taking glipizide BID Since reducing to once daily, yesterday 91, 119 the previous week  Patient denies nocturia (nighttime urination).  Patient denies neuropathy (nerve pain). Patient denies visual changes. Patient denies self foot exams.   Patient reported dietary habits:  - eats twice daily  - drinks diet sodas   Patient-reported exercise habits:  - exercise limited d/t knee pain   O:   Lab Results  Component Value Date   HGBA1C 7.7 (A) 06/22/2022   There were no vitals filed for this visit.  Lipid  Panel     Component Value Date/Time   CHOL 79 (L) 07/10/2022 0730   TRIG 84 07/10/2022 0730   HDL 32 (L) 07/10/2022 0730   CHOLHDL 2.5 07/10/2022 0730   CHOLHDL 3.4 07/22/2017 0303   VLDL 12 07/22/2017 0303   LDLCALC 30 07/10/2022 0730    Clinical Atherosclerotic Cardiovascular Disease (ASCVD): Yes  The ASCVD Risk score (Arnett DK, et al., 2019) failed to calculate for the following reasons:   The patient has a prior MI or stroke diagnosis    A/P: Diabetes longstanding currently close to goal based on home BG readings and A1c. Patient is able to verbalize appropriate hypoglycemia management plan. Medication adherence appears appropriate. Discussed with patient the option to switch insurance plans with a lower deductible.  -Decreased dose of glipizide to 10 mg once daily given hypoglycemia on BID dosing.  -Continued metformin 500 mg XR 2 tablets BID.  -Patient educated on purpose, proper use, and potential adverse effects of glipizide.  -Extensively discussed pathophysiology of diabetes, recommended lifestyle interventions, dietary effects on blood sugar control.  -Counseled on s/sx of and management of hypoglycemia.  -Next A1c anticipated 09/2022.   Written patient instructions provided. Patient verbalized understanding of treatment plan.  Total time in face to face counseling 30 minutes.    Follow-up:  Pharmacist PRN. PCP clinic visit in 08/17/2022  [name], Pharm.D. PGY-2 Ambulatory Care Pharmacy Resident

## 2022-08-17 ENCOUNTER — Encounter (INDEPENDENT_AMBULATORY_CARE_PROVIDER_SITE_OTHER): Payer: Self-pay | Admitting: Primary Care

## 2022-08-17 ENCOUNTER — Ambulatory Visit (INDEPENDENT_AMBULATORY_CARE_PROVIDER_SITE_OTHER): Payer: BLUE CROSS/BLUE SHIELD | Admitting: Primary Care

## 2022-08-17 VITALS — BP 128/79 | HR 71 | Resp 16 | Wt 186.6 lb

## 2022-08-17 DIAGNOSIS — E6609 Other obesity due to excess calories: Secondary | ICD-10-CM | POA: Diagnosis not present

## 2022-08-17 DIAGNOSIS — Z23 Encounter for immunization: Secondary | ICD-10-CM

## 2022-08-17 DIAGNOSIS — E119 Type 2 diabetes mellitus without complications: Secondary | ICD-10-CM | POA: Diagnosis not present

## 2022-08-17 DIAGNOSIS — E1100 Type 2 diabetes mellitus with hyperosmolarity without nonketotic hyperglycemic-hyperosmolar coma (NKHHC): Secondary | ICD-10-CM

## 2022-08-17 DIAGNOSIS — I10 Essential (primary) hypertension: Secondary | ICD-10-CM

## 2022-08-17 DIAGNOSIS — Z6830 Body mass index (BMI) 30.0-30.9, adult: Secondary | ICD-10-CM

## 2022-08-17 LAB — POCT GLYCOSYLATED HEMOGLOBIN (HGB A1C): HbA1c, POC (controlled diabetic range): 6.6 % (ref 0.0–7.0)

## 2022-08-17 NOTE — Patient Instructions (Signed)
Recuento de caloras para bajar de peso Calorie Counting for Weight Loss Las caloras son unidades de energa. El cuerpo necesita una cierta cantidad de caloras de los alimentos para que lo ayuden a funcionar durante todo el da. Cuando se comen o beben ms caloras de las que el cuerpo necesita, este acumula las caloras adicionales mayormente como grasa. Cuando se comen o beben menos caloras de las que el cuerpo necesita, este quema grasa para obtener la energa que necesita. El recuento de caloras es el registro de la cantidad de caloras que se comen y beben cada da. El recuento de caloras puede ser de ayuda si necesita perder peso. Si come menos caloras de las que el cuerpo necesita, debera bajar de peso. Pregntele al mdico cul es un peso sano para usted. Para que el recuento de caloras funcione, usted tendr que ingerir la cantidad de caloras adecuadas cada da, para bajar una cantidad de peso saludable por semana. Un nutricionista puede ayudar a determinar la cantidad de caloras que usted necesita por da y sugerirle formas de alcanzar su objetivo calrico. Una cantidad de peso saludable para bajar cada semana suele ser entre 1 y 2 libras (0.5 a 0.9 kg). Esto habitualmente significa que su ingesta diaria de caloras se debera reducir en unas 500 a 750 caloras. Ingerir de 1200 a 1500 caloras por da puede ayudar a la mayora de las mujeres a bajar de peso. Ingerir de 1500 a 1800 caloras por da puede ayudar a la mayora de los hombres a bajar de peso. Qu debo saber acerca del recuento de caloras? Trabaje con el mdico o el nutricionista para determinar cuntas caloras debe recibir cada da. A fin de alcanzar su objetivo diario de caloras, tendr que: Averiguar cuntas caloras hay en cada alimento que le gustara comer. Intente hacerlo antes de comer. Decidir la cantidad que puede comer del alimento. Llevar un registro de los alimentos. Para esto, anote lo que comi y cuntas  caloras tena. Para perder peso con xito, es importante equilibrar el recuento de caloras con un estilo de vida saludable que incluya actividad fsica de forma regular. Dnde encuentro informacin sobre las caloras?  Es posible encontrar la cantidad de caloras que contiene un alimento en la etiqueta de informacin nutricional. Si un alimento no tiene una etiqueta de informacin nutricional, intente buscar las caloras en Internet o pida ayuda al nutricionista. Recuerde que las caloras se calculan por porcin. Si opta por comer ms de una porcin de un alimento, tendr que multiplicar las caloras de una porcin por la cantidad de porciones que planea comer. Por ejemplo, la etiqueta de un envase de pan puede decir que el tamao de una porcin es 1 rodaja, y que una porcin tiene 90 caloras. Si come 1 rodaja, habr comido 90 caloras. Si come 2 rodajas, habr comido 180 caloras. Cmo llevo un registro de comidas? Despus de cada vez que coma, anote lo siguiente en el registro de alimentos lo antes posible: Lo que comi. Asegrese de incluir los aderezos, las salsas y otros extras en los alimentos. La cantidad que comi. Esto se puede medir en tazas, onzas o cantidad de alimentos. Cuntas caloras haba en cada alimento y en cada bebida. La cantidad total de caloras en la comida que tom. Tenga a mano el registro de alimentos, por ejemplo, en un anotador de bolsillo o utilice una aplicacin o sitio web en el telfono mvil. Algunos programas calcularn las caloras por usted y le mostrarn la cantidad de   caloras que le quedan para llegar al objetivo diario. Cules son algunos consejos para controlar las porciones? Sepa cuntas caloras hay en una porcin. Esto lo ayudar a saber cuntas porciones de un alimento determinado puede comer. Use una taza medidora para medir los tamaos de las porciones. Tambin puede intentar pesar las porciones en una balanza de cocina. Con el tiempo, podr hacer  un clculo estimativo de los tamaos de las porciones de algunos alimentos. Dedique tiempo a poner porciones de diferentes alimentos en sus platos, tazones y tazas predilectos, a fin de saber cmo se ve una porcin. Intente no comer directamente de un envase de alimentos, por ejemplo, de una bolsa o una caja. Comer directamente del envase dificulta ver cunto est comiendo y puede conducir a comer en exceso. Ponga la cantidad que le gustara comer en una taza o un plato, a fin de asegurarse de que est comiendo la porcin correcta. Use platos, vasos y tazones ms pequeos para medir porciones ms pequeas y evitar no comer en exceso. Intente no realizar varias tareas al mismo tiempo. Por ejemplo, evite mirar televisin o usar la computadora mientras come. Si es la hora de comer, sintese a la mesa y disfrute de la comida. Esto lo ayudar a reconocer cundo est satisfecho. Tambin le permitir estar ms consciente de qu come y cunto come. Consejos para seguir este plan Al leer las etiquetas de los alimentos Controle el recuento de caloras en comparacin con el tamao de la porcin. El tamao de la porcin puede ser ms pequeo de lo que suele comer. Verifique la fuente de las caloras. Intente elegir alimentos ricos en protenas, fibras y vitaminas, y bajos en grasas saturadas, grasas trans y sodio. Al ir de compras Lea las etiquetas nutricionales cuando compre. Esto lo ayudar a tomar decisiones saludables sobre qu alimentos comprar. Preste atencin a las etiquetas nutricionales de alimentos bajos en grasas o sin grasas. Estos alimentos a veces tienen la misma cantidad de caloras o ms caloras que las versiones ricas en grasas. Con frecuencia, tambin tienen agregados de azcar, almidn o sal, para darles el sabor que fue eliminado con las grasas. Haga una lista de compras con los alimentos que tienen un menor contenido de caloras y resptela. Al cocinar Intente cocinar sus alimentos preferidos  de una manera ms saludable. Por ejemplo, pruebe hornear en vez de frer. Utilice productos lcteos descremados. Planificacin de las comidas Utilice ms frutas y verduras. La mitad de su plato debe ser de frutas y verduras. Incluya protenas magras, como pollo, pavo y pescado. Estilo de vida Cada semana, trate de hacer una de las siguientes cosas: 150 minutos de ejercicio moderado, como caminar. 75 minutos de ejercicio enrgico, como correr. Informacin general Sepa cuntas caloras tienen los alimentos que come con ms frecuencia. Esto le ayudar a contar las caloras ms rpidamente. Encuentre un mtodo para controlar las caloras que funcione para usted. Sea creativo. Pruebe aplicaciones o programas distintos, si llevar un registro de las caloras no funciona para usted. Qu alimentos debo consumir?  Consuma alimentos nutritivos. Es mejor comer un alimento nutritivo, de alto contenido calrico, como un aguacate, que uno con pocos nutrientes, como una bolsa de patatas fritas. Use sus caloras en alimentos y bebidas que lo sacien y no lo dejen con apetito apenas termina de comer. Ejemplos de alimentos que lo sacian son los frutos secos y mantequillas de frutos secos, verduras, protenas magras y alimentos con alto contenido de fibra como los cereales integrales. Los alimentos con alto   contenido de fibra son aquellos que tienen ms de 5 g de fibra por porcin. Preste atencin a las caloras en las bebidas. Las bebidas de bajas caloras incluyen agua y refrescos sin azcar. Es posible que los productos que se enumeran ms arriba no constituyan una lista completa de los alimentos y las bebidas que puede tomar. Consulte a un nutricionista para obtener ms informacin. Qu alimentos debo limitar? Limite el consumo de alimentos o bebidas que no sean buenas fuentes de vitaminas, minerales o protenas, o que tengan alto contenido de grasas no saludables. Estos incluyen: Caramelos. Otros  dulces. Refrescos, bebidas con caf especiales, alcohol y jugo. Es posible que los productos que se enumeran ms arriba no constituyan una lista completa de los alimentos y las bebidas que debe evitar. Consulte a un nutricionista para obtener ms informacin. Cmo puedo hacer el recuento de caloras cuando como afuera? Preste atencin a las porciones. A menudo, las porciones son mucho ms grandes al comer afuera. Pruebe con estos consejos para mantener las porciones ms pequeas: Considere la posibilidad de compartir una comida en lugar de tomarla toda usted solo. Si pide su propia comida, coma solo la mitad. Antes de empezar a comer, pida un recipiente y ponga la mitad de la comida en l. Cuando sea posible, considere la posibilidad de pedir porciones ms pequeas del men en lugar de porciones completas. Preste atencin a la eleccin de alimentos y bebidas. Saber la forma en que se cocinan los alimentos y lo que incluye la comida puede ayudarlo a ingerir menos caloras. Si se detallan las caloras en el men, elija las opciones que contengan la menor cantidad. Elija platos que incluyan verduras, frutas, cereales integrales, productos lcteos con bajo contenido de grasa y protenas magras. Opte por los alimentos hervidos, asados, cocidos a la parrilla o al vapor. Evite los alimentos a los que se les ponga mantequilla, que estn empanados o fritos, o que se sirvan con salsa a base de crema. Generalmente, los alimentos que se etiquetan como "crujientes" estn fritos, a menos que se indique lo contrario. Elija el agua, la leche descremada, el t helado sin azcar u otras bebidas que no contengan azcares agregados. Si desea una bebida alcohlica, escoja una opcin con menos caloras, como una copa de vino o una cerveza ligera. Ordene los aderezos, las salsas y los jarabes aparte. Estos son, con frecuencia, de alto contenido en caloras, por lo que debe limitar la cantidad que ingiere. Si desea una  ensalada, elija una de hortalizas y pida carnes a la parrilla. Evite las guarniciones adicionales como el tocino, el queso o los alimentos fritos. Ordene el aderezo aparte o pida aceite de oliva y vinagre o limn para aderezar. Haga un clculo estimativo de la cantidad de porciones que le sirven. Conocer el tamao de las porciones lo ayudar a estar atento a la cantidad de comida que come en los restaurantes. Dnde buscar ms informacin Centers for Disease Control and Prevention (Centros para el Control y la Prevencin de Enfermedades): www.cdc.gov U.[place]. Department of Agriculture (Departamento de Agricultura de los [place]): myplate.gov Resumen El recuento de caloras es el registro de la cantidad de caloras que se comen y beben cada da. Si come menos caloras de las que el cuerpo necesita, debera bajar de peso. Una cantidad de peso saludable para bajar por semana suele ser entre 1 y 2 libras (0.5 a 0.9 kg). Esto significa, con frecuencia, reducir su ingesta diaria de caloras unas 500 a 750 caloras. Es posible   encontrar la cantidad de caloras que contiene un alimento en la etiqueta de informacin nutricional. Si un alimento no tiene una etiqueta de informacin nutricional, intente buscar las caloras en Internet o pida ayuda al nutricionista. Use platos, vasos y tazones ms pequeos para medir porciones ms pequeas y evitar no comer en exceso. Use sus caloras en alimentos y bebidas que lo sacien y no lo dejen con apetito poco tiempo despus de haber comido. Esta informacin no tiene como fin reemplazar el consejo del mdico. Asegrese de hacerle al mdico cualquier pregunta que tenga. Document Revised: 07/14/2019 Document Reviewed: 07/14/2019 Elsevier Patient Education  2023 Elsevier Inc.  

## 2022-08-17 NOTE — Progress Notes (Signed)
[facility]  [name], is a 62 y.o. male  ZOX:[id number]  [id number]  DOB - 24-Apr-1960  Chief Complaint  Patient presents with   Diabetes   Hypertension       Subjective:   [name] is a 62 y.o. Hispanic male (interpreter Gardiner Ramus [phone]) with here today for a follow up visit for the management of hypertension. Patient has No headache, No chest pain, No abdominal pain - No Nausea, No new weakness tingling or numbness, No Cough - shortness of breath.  Type 2 diabetes he denies polyuria, polydipsia, polyphasia, or any vision changes.  Today he feels good.  No problems updated.  Allergies  Allergen Reactions   Tape     Past Medical History:  Diagnosis Date   CAD (coronary artery disease), native coronary artery 09/20/2017   Diabetes mellitus without complication (HCC)    Hyperlipidemia    Hypertension     Current Outpatient Medications on File Prior to Visit  Medication Sig Dispense Refill   acetaminophen (TYLENOL) 325 MG tablet Take 650 mg by mouth every 6 (six) hours as needed.     aspirin EC 81 MG EC tablet Take 1 tablet (81 mg total) by mouth daily.     atorvastatin (LIPITOR) 80 MG tablet Take 1 tablet (80 mg total) by mouth daily at 6 PM. 90 tablet 0   Blood Glucose Monitoring Suppl (CONTOUR NEXT MONITOR) w/Device KIT Use to check blood sugar once daily. 1 kit 0   Continuous Blood Gluc Receiver (DEXCOM G7 RECEIVER) DEVI Use to check blood sugar continuously. 1 each 0   Continuous Blood Gluc Sensor (DEXCOM G7 SENSOR) MISC Use to check blood sugar continuously. Change sensors once every 10 days. 3 each 6   ezetimibe (ZETIA) 10 MG tablet Take 1 tablet (10 mg total) by mouth daily. 90 tablet 0   glipiZIDE (GLUCOTROL) 10 MG tablet Take 1 tablet (10 mg total) by mouth daily before breakfast. 90 tablet 3   glucose blood (CONTOUR NEXT TEST) test strip Use to check blood sugar once daily. 100 each 2   hydrochlorothiazide (HYDRODIURIL) 25 MG  tablet Take 1 tablet (25 mg total) by mouth daily. 90 tablet 0   losartan (COZAAR) 100 MG tablet Take 1 tablet (100 mg total) by mouth daily. 90 tablet 0   metFORMIN (GLUCOPHAGE-XR) 500 MG 24 hr tablet Take 2 tablets (1,000 mg total) by mouth 2 (two) times daily with a meal. 360 tablet 1   metoprolol tartrate (LOPRESSOR) 50 MG tablet Take 1 tablet (50 mg total) by mouth 2 (two) times daily. 180 tablet 0   Microlet Lancets MISC Use to check blood sugar once daily. 100 each 3   potassium chloride (KLOR-CON) 10 MEQ tablet TAKE 1 TABLET (10 MEQ TOTAL) BY MOUTH DAILY. 90 tablet 0   No current facility-administered medications on file prior to visit.    Objective:   Vitals:   08/17/22 0839  BP: 128/79  Pulse: 71  Resp: 16  SpO2: 100%  Weight: 186 lb 9.6 oz (84.6 kg)    Comprehensive ROS Pertinent positive and negative noted in HPI   Exam General appearance : Awake, alert, not in any distress. Speech Clear. Not toxic looking HEENT: Atraumatic and Normocephalic, pupils equally reactive to light and accomodation Neck: Supple, no JVD. No cervical lymphadenopathy.  Chest: Good air entry bilaterally, no added sounds  CVS: S1 S2 regular, no murmurs.  Abdomen: Bowel sounds present, Non tender and not distended with no gaurding,  rigidity or rebound. Extremities: B/L Lower Ext shows no edema, both legs are warm to touch Neurology: Awake alert, and oriented X 3, CN II-XII intact, Non focal Skin: No Rash  Data Review Lab Results  Component Value Date   HGBA1C 6.6 08/17/2022   HGBA1C 7.7 (A) 06/22/2022   HGBA1C 9.8 (A) 05/17/2022    Assessment & Plan  Avery was seen today for diabetes and hypertension.  Diagnoses and all orders for this visit:  Type 2 diabetes mellitus with hyperosmolarity without coma, without long-term current use of insulin (HCC) -     POCT glycosylated hemoglobin (Hb A1C) 6.6  - educated on lifestyle modifications, including but not limited to diet choices and  adding exercise to daily routine.   -     Ambulatory referral to Ophthalmology  Encounter for diabetic foot exam [facility]) Completed   Essential hypertension Well controlled followed by cardiology   Class 1 obesity due to excess calories with serious comorbidity and body mass index (BMI) of 30.0 to 30.9  Obesity is 30-39 indicating an excess in caloric intake or underlining conditions. This may lead to other co-morbidities. Educated on lifestyle modifications of diet and exercise which may reduce obesity.    Other orders -     Varicella-zoster vaccine IM     Patient have been counseled extensively about nutrition and exercise. Other issues discussed during this visit include: low cholesterol diet, weight control and daily exercise, foot care, annual eye examinations at Ophthalmology, importance of adherence with medications and regular follow-up. We also discussed long term complications of uncontrolled diabetes and hypertension.   Return in about 6 months (around 02/17/2023) for DM.  The patient was given clear instructions to go to ER or return to medical center if symptoms don't improve, worsen or new problems develop. The patient verbalized understanding. The patient was told to call to get lab results if they haven't heard anything in the next week.   This note has been created with Education officer, environmental. Any transcriptional errors are unintentional.   Kenneth Sessions, NP 08/17/2022, 8:59 AM

## 2022-09-15 ENCOUNTER — Other Ambulatory Visit: Payer: Self-pay | Admitting: Cardiology

## 2022-09-15 DIAGNOSIS — I2510 Atherosclerotic heart disease of native coronary artery without angina pectoris: Secondary | ICD-10-CM

## 2022-10-02 ENCOUNTER — Ambulatory Visit (HOSPITAL_COMMUNITY)
Admission: EM | Admit: 2022-10-02 | Discharge: 2022-10-02 | Disposition: A | Discharge status: Home / Self Care | Payer: BC Managed Care – PPO | Attending: Urgent Care | Admitting: Urgent Care

## 2022-10-02 ENCOUNTER — Encounter (HOSPITAL_COMMUNITY): Payer: Self-pay

## 2022-10-02 DIAGNOSIS — L03116 Cellulitis of left lower limb: Secondary | ICD-10-CM

## 2022-10-02 DIAGNOSIS — S91332A Puncture wound without foreign body, left foot, initial encounter: Secondary | ICD-10-CM | POA: Diagnosis not present

## 2022-10-02 DIAGNOSIS — Z23 Encounter for immunization: Secondary | ICD-10-CM

## 2022-10-02 MED ORDER — CEFTRIAXONE SODIUM 1 G IJ SOLR
INTRAMUSCULAR | Status: AC
  Filled 2022-10-02: qty 10

## 2022-10-02 MED ORDER — TETANUS-DIPHTH-ACELL PERTUSSIS 5-2.5-18.5 LF-MCG/0.5 IM SUSY
0.5000 mL | PREFILLED_SYRINGE | Freq: Once | INTRAMUSCULAR | Status: AC
  Administered 2022-10-02: 0.5 mL via INTRAMUSCULAR

## 2022-10-02 MED ORDER — AMOXICILLIN-POT CLAVULANATE 875-125 MG PO TABS: 1.0000 | ORAL_TABLET | Freq: Two times a day (BID) | ORAL | 0 refills | Status: AC

## 2022-10-02 MED ORDER — CEFTRIAXONE SODIUM 1 G IJ SOLR
1.0000 g | Freq: Once | INTRAMUSCULAR | Status: AC
  Administered 2022-10-02: 1 g via INTRAMUSCULAR

## 2022-10-02 MED ORDER — TETANUS-DIPHTH-ACELL PERTUSSIS 5-2.5-18.5 LF-MCG/0.5 IM SUSY
PREFILLED_SYRINGE | INTRAMUSCULAR | Status: AC
  Filled 2022-10-02: qty 0.5

## 2022-10-02 MED ORDER — DICLOFENAC SODIUM 75 MG PO TBEC: 75.0000 mg | DELAYED_RELEASE_TABLET | Freq: Two times a day (BID) | ORAL | 0 refills | Status: DC

## 2022-10-02 MED ORDER — LIDOCAINE HCL (PF) 1 % IJ SOLN
INTRAMUSCULAR | Status: AC
  Filled 2022-10-02: qty 2

## 2022-10-02 NOTE — ED Provider Notes (Signed)
[facility]    CSN: [id number] Arrival date & time: 10/02/22  1404      History   Chief Complaint Chief Complaint  Patient presents with   Ankle Pain   Fall    HPI [name] is a 62 y.o. male.   Pleasant 62 year old male presents today due to concerns of left ankle injury.  [place] and cut the back of his heel on a rock.  He did not feel that the laceration was severe enough at that time to seek medical attention, but over the past 48 hours, states his ankle has become painful, swollen, red, and warm.  He is also had some clear drainage coming out of the puncture wound.  He is uncertain when his last tetanus vaccine was.  He felt that he had a fever yesterday, but does not have one today.  He denies any lymphangitis or swollen lymph nodes. No treatments tried other than topical antibiotic ointment OTC.  He reports decreased range of motion to both plantar and dorsi flexion primarily due to the swelling.  He denies any bruising.  He denies any pain to his bony landmarks, feels that the pain he is feeling is secondary to infection.  He is a diabetic, states that his sugar yesterday was 86.  His DM is under good control.   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   NSTEMI (non-ST elevated myocardial infarction) (HCC)    Essential hypertension    Hyperlipidemia LDL goal <70    Type 2 diabetes mellitus with hyperosmolarity without coma, without long-term current use of insulin (HCC)     Past Surgical History:  Procedure  Laterality Date   CORONARY ARTERY BYPASS GRAFT N/A 07/23/2017   Procedure: CORONARY ARTERY BYPASS GRAFTING (CABG) x Five , using left internal mammary artery and right leg greater saphenous vein harvested endoscopically;  Surgeon: [name], MD;  Location: [facility] OR;  Service: Open Heart Surgery;  Laterality: N/A;   LEFT HEART CATH AND CORONARY ANGIOGRAPHY N/A 07/22/2017   Procedure: LEFT HEART CATH AND CORONARY ANGIOGRAPHY;  Surgeon: [place], Peter M, MD;  Location: [facility] INVASIVE CV LAB;  Service: Cardiovascular;  Laterality: N/A;   TEE WITHOUT CARDIOVERSION N/A 07/23/2017   Procedure: TRANSESOPHAGEAL ECHOCARDIOGRAM (TEE);  Surgeon: [name], MD;  Location: [facility] OR;  Service: Open Heart Surgery;  Laterality: N/A;       Home Medications    Prior to Admission medications   Medication Sig Start Date End Date Taking? Authorizing Provider  acetaminophen (TYLENOL) 325 MG tablet Take 650 mg by mouth every 6 (six) hours as needed.   Yes [provider]  amoxicillin-clavulanate (AUGMENTIN) 875-125 MG tablet Take 1 tablet by mouth 2 (two) times daily with a meal for 7 days. 10/02/22 10/09/22 Yes Laaibah Wartman L, PA  aspirin EC 81 MG EC tablet Take 1 tablet (81 mg total) by mouth daily. 07/31/17  Yes Barrett, Erin R, PA-C  atorvastatin (LIPITOR) 80 MG tablet Take 1 tablet (80 mg total) by  mouth daily at 6 PM. 07/04/22  Yes Turner, Cornelious Bryant, MD  Blood Glucose Monitoring Suppl (CONTOUR NEXT MONITOR) w/Device KIT Use to check blood sugar once daily. 06/27/22  Yes Hoy Register, MD  Continuous Blood Gluc Receiver (DEXCOM G7 RECEIVER) DEVI Use to check blood sugar continuously. 06/22/22  Yes Hoy Register, MD  Continuous Blood Gluc Sensor (DEXCOM G7 SENSOR) MISC Use to check blood sugar continuously. Change sensors once every 10 days. 06/22/22  Yes Hoy Register, MD  diclofenac (VOLTAREN) 75 MG EC tablet Take 1 tablet (75 mg total) by mouth 2 (two) times daily with a meal. 10/02/22  Yes Jatziry Wechter,  Booker Bhatnagar L, PA  ezetimibe (ZETIA) 10 MG tablet Take 1 tablet (10 mg total) by mouth daily. 07/04/22  Yes Turner, Cornelious Bryant, MD  glipiZIDE (GLUCOTROL) 10 MG tablet Take 1 tablet (10 mg total) by mouth daily before breakfast. 07/24/22  Yes Newlin, Enobong, MD  glucose blood (CONTOUR NEXT TEST) test strip Use to check blood sugar once daily. 06/27/22  Yes Hoy Register, MD  hydrochlorothiazide (HYDRODIURIL) 25 MG tablet Take 1 tablet (25 mg total) by mouth daily. 07/04/22  Yes Turner, Cornelious Bryant, MD  losartan (COZAAR) 100 MG tablet Take 1 tablet (100 mg total) by mouth daily. 07/04/22  Yes Quintella Reichert, MD  metFORMIN (GLUCOPHAGE-XR) 500 MG 24 hr tablet Take 2 tablets (1,000 mg total) by mouth 2 (two) times daily with a meal. 05/19/22  Yes Newlin, Enobong, MD  metoprolol tartrate (LOPRESSOR) 50 MG tablet Take 1 tablet (50 mg total) by mouth 2 (two) times daily. 07/04/22  Yes Quintella Reichert, MD  Microlet Lancets MISC Use to check blood sugar once daily. 06/27/22  Yes Newlin, Odette Horns, MD  potassium chloride (KLOR-CON) 10 MEQ tablet TAKE 1 TABLET (10 MEQ TOTAL) BY MOUTH DAILY. 09/15/22  Yes Quintella Reichert, MD    Family History Family History  Problem Relation Age of Onset   Heart disease Mother     Social History Social History   Tobacco Use   Smoking status: Former    Types: Cigarettes    Quit date: 07/22/1998    Years since quitting: 24.2   Smokeless tobacco: Never  Substance Use Topics   Alcohol use: Yes   Drug use: No     Allergies   Tape   Review of Systems Review of Systems As per HPI  Physical Exam Triage Vital Signs ED Triage Vitals [10/02/22 1520]  Enc Vitals Group     BP 137/69     Pulse Rate 95     Resp 18     Temp 98.6 F (37 C)     Temp Source Oral     SpO2 95 %     Weight      Height      Head Circumference      Peak Flow      Pain Score      Pain Loc      Pain Edu?      Excl. in GC?    No data found.  Updated Vital Signs BP 137/69 (BP Location: Left Arm)    Pulse 95   Temp 98.6 F (37 C) (Oral)   Resp 18   SpO2 95%   Visual Acuity Right Eye Distance:   Left Eye Distance:   Bilateral Distance:    Right Eye Near:   Left Eye Near:    Bilateral Near:     Physical Exam Vitals and nursing note reviewed.  Exam conducted with a chaperone present.  Constitutional:      General: He is not in acute distress.    Appearance: Normal appearance. He is normal weight. He is not ill-appearing, toxic-appearing or diaphoretic.  Musculoskeletal:     Left foot: Decreased range of motion (due to edema around distal achilles/ calcaneus).       Feet:  Feet:     Right foot:     Skin integrity: No skin breakdown, erythema, warmth, dry skin or fissure.     Left foot:     Skin integrity: Erythema and warmth present. No skin breakdown. Left foot blister: pucture wound calcaneous. Neurological:     Mental Status: He is alert.      UC Treatments / Results  Labs (all labs ordered are listed, but only abnormal results are displayed) Labs Reviewed - No data to display  EKG   Radiology No results found.  Procedures Procedures (including critical care time)  Medications Ordered in UC Medications  cefTRIAXone (ROCEPHIN) injection 1 g (1 g Intramuscular Given 10/02/22 1545)  Tdap (BOOSTRIX) injection 0.5 mL (0.5 mLs Intramuscular Given 10/02/22 1545)    Initial Impression / Assessment and Plan / UC Course  I have reviewed the triage vital signs and the nursing notes.  Pertinent labs & imaging results that were available during my care of the patient were reviewed by me and considered in my medical decision making (see chart for details).     Puncture wound left heel - caused by injury from a rock. No FB noted. Diclofenac BID PRN to help with pain and swelling surrounding the wound. Tetanus vaccination - updated today Cellulitis L lower extremity - no signs of lymphangitis on exam today. Will give rocephin IM given hx of DM to prevent worsening  infection, DC home on PO augmentin.    Final Clinical Impressions(s) / UC Diagnoses   Final diagnoses:  Puncture wound of left heel, initial encounter  Need for prophylactic vaccination with combined diphtheria-tetanus-pertussis (DTP) vaccine  Cellulitis of left lower extremity     Discharge Instructions      You have cellulitis, which is an infection of the skin.  Please read the attached handouts regarding care for this condition.  Please take the antibiotic twice daily with food to prevent upset stomach. Monitor the redness to the heel, if it extends outside of the line drawn today, return for recheck. If you develop a fever, or see a single red line going up the leg, please head to the emergency room.  Continue to apply topical antibiotic ointment to the affected area.  Please leave the wound open at nighttime.  Take the anti-inflammatory medication on an as-needed basis for pain or swelling.  Take this with food.  Please avoid all other over-the-counter anti-inflammatories such as Advil, Aleve, ibuprofen, Motrin.     ED Prescriptions     Medication Sig Dispense Auth. Provider   amoxicillin-clavulanate (AUGMENTIN) 875-125 MG tablet Take 1 tablet by mouth 2 (two) times daily with a meal for 7 days. 14 tablet Tessah Patchen L, PA   diclofenac (VOLTAREN) 75 MG EC tablet Take 1 tablet (75 mg total) by mouth 2 (two) times daily with a meal. 10 tablet Rontrell Moquin L, PA      PDMP not reviewed this encounter.   Maretta Bees, [place] 10/02/22 1904

## 2022-10-02 NOTE — Discharge Instructions (Addendum)
You have cellulitis, which is an infection of the skin.  Please read the attached handouts regarding care for this condition.  Please take the antibiotic twice daily with food to prevent upset stomach. Monitor the redness to the heel, if it extends outside of the line drawn today, return for recheck. If you develop a fever, or see a single red line going up the leg, please head to the emergency room.  Continue to apply topical antibiotic ointment to the affected area.  Please leave the wound open at nighttime.  Take the anti-inflammatory medication on an as-needed basis for pain or swelling.  Take this with food.  Please avoid all other over-the-counter anti-inflammatories such as Advil, Aleve, ibuprofen, Motrin.

## 2022-10-02 NOTE — ED Triage Notes (Signed)
Here for L-ankle pain; patient had a fall 3 days ago. Denies any other symptoms.

## 2022-10-20 ENCOUNTER — Other Ambulatory Visit: Payer: Self-pay | Admitting: Family Medicine

## 2022-11-13 ENCOUNTER — Other Ambulatory Visit (INDEPENDENT_AMBULATORY_CARE_PROVIDER_SITE_OTHER): Payer: Self-pay | Admitting: Primary Care

## 2022-11-13 ENCOUNTER — Other Ambulatory Visit: Payer: Self-pay | Admitting: Cardiology

## 2022-11-13 DIAGNOSIS — E785 Hyperlipidemia, unspecified: Secondary | ICD-10-CM

## 2022-11-13 DIAGNOSIS — I10 Essential (primary) hypertension: Secondary | ICD-10-CM

## 2022-11-13 DIAGNOSIS — E1100 Type 2 diabetes mellitus with hyperosmolarity without nonketotic hyperglycemic-hyperosmolar coma (NKHHC): Secondary | ICD-10-CM

## 2022-11-13 DIAGNOSIS — I2510 Atherosclerotic heart disease of native coronary artery without angina pectoris: Secondary | ICD-10-CM

## 2022-12-30 ENCOUNTER — Other Ambulatory Visit: Payer: Self-pay | Admitting: Cardiology

## 2022-12-30 DIAGNOSIS — E785 Hyperlipidemia, unspecified: Secondary | ICD-10-CM

## 2023-01-05 ENCOUNTER — Other Ambulatory Visit: Payer: Self-pay | Admitting: Primary Care

## 2023-01-05 DIAGNOSIS — Z1211 Encounter for screening for malignant neoplasm of colon: Secondary | ICD-10-CM

## 2023-01-05 DIAGNOSIS — Z1212 Encounter for screening for malignant neoplasm of rectum: Secondary | ICD-10-CM

## 2023-01-24 ENCOUNTER — Encounter (INDEPENDENT_AMBULATORY_CARE_PROVIDER_SITE_OTHER): Payer: Self-pay | Admitting: Primary Care

## 2023-01-24 ENCOUNTER — Ambulatory Visit (INDEPENDENT_AMBULATORY_CARE_PROVIDER_SITE_OTHER): Payer: BC Managed Care – PPO | Admitting: Primary Care

## 2023-01-24 VITALS — BP 132/83 | HR 83 | Resp 16 | Ht 67.0 in | Wt 187.0 lb

## 2023-01-24 DIAGNOSIS — I10 Essential (primary) hypertension: Secondary | ICD-10-CM

## 2023-01-24 DIAGNOSIS — Z23 Encounter for immunization: Secondary | ICD-10-CM

## 2023-01-24 DIAGNOSIS — Z7984 Long term (current) use of oral hypoglycemic drugs: Secondary | ICD-10-CM

## 2023-01-24 DIAGNOSIS — Z1211 Encounter for screening for malignant neoplasm of colon: Secondary | ICD-10-CM | POA: Diagnosis not present

## 2023-01-24 DIAGNOSIS — E785 Hyperlipidemia, unspecified: Secondary | ICD-10-CM

## 2023-01-24 DIAGNOSIS — E1100 Type 2 diabetes mellitus with hyperosmolarity without nonketotic hyperglycemic-hyperosmolar coma (NKHHC): Secondary | ICD-10-CM | POA: Diagnosis not present

## 2023-01-24 DIAGNOSIS — Z1212 Encounter for screening for malignant neoplasm of rectum: Secondary | ICD-10-CM | POA: Diagnosis not present

## 2023-01-24 LAB — POCT GLYCOSYLATED HEMOGLOBIN (HGB A1C): HbA1c, POC (controlled diabetic range): 6.2 % (ref 0.0–7.0)

## 2023-01-24 MED ORDER — CONTOUR NEXT TEST VI STRP: ORAL_STRIP | 2 refills | Status: AC

## 2023-01-24 MED ORDER — METFORMIN HCL ER 500 MG PO TB24: 500.0000 mg | ORAL_TABLET | Freq: Every day | ORAL | 1 refills | Status: DC

## 2023-01-24 MED ORDER — GLIPIZIDE 10 MG PO TABS: 10.0000 mg | ORAL_TABLET | Freq: Two times a day (BID) | ORAL | 1 refills | Status: DC

## 2023-01-24 NOTE — Progress Notes (Signed)
[facility]  [name], is a 62 y.o. male  UYQ:[id number]  GLO:[id number]  DOB - [birth year]  Chief Complaint  Patient presents with   Diabetes       Subjective:   [name] is a 62 y.o. Hispanic male (interpreter Maxine Glenn [phone]) here today for a follow up visit HTN. Patient has No headache, No chest pain, No abdominal pain - No Nausea, No new weakness tingling or numbness, No Cough - shortness of breath. T2D- Denies polyuria, polydipsia, polyphasia or vision changes.  Does check blood sugars at home.[phone])  No problems updated.  Allergies  Allergen Reactions   Tape     Past Medical History:  Diagnosis Date   CAD (coronary artery disease), native coronary artery 09/20/2017   Diabetes mellitus without complication (HCC)    Hyperlipidemia    Hypertension     Current Outpatient Medications on File Prior to Visit  Medication Sig Dispense Refill   acetaminophen (TYLENOL) 325 MG tablet Take 650 mg by mouth every 6 (six) hours as needed.     aspirin EC 81 MG EC tablet Take 1 tablet (81 mg total) by mouth daily.     atorvastatin (LIPITOR) 80 MG tablet TAKE 1 TABLET (80 MG TOTAL) BY MOUTH DAILY AT 6 PM. 90 tablet 2   Blood Glucose Monitoring Suppl (CONTOUR NEXT MONITOR) w/Device KIT Use to check blood sugar once daily. 1 kit 0   Continuous Blood Gluc Receiver (DEXCOM G7 RECEIVER) DEVI Use to check blood sugar continuously. 1 each 0   Continuous Blood Gluc Sensor (DEXCOM G7 SENSOR) MISC Use to check blood sugar continuously. Change sensors once every 10 days. 3 each 6   diclofenac (VOLTAREN) 75 MG EC tablet Take 1 tablet (75 mg total) by mouth 2 (two) times daily with a meal. 10 tablet 0   ezetimibe (ZETIA) 10 MG tablet TAKE 1 TABLET (10 MG TOTAL) BY MOUTH DAILY. 90 tablet 1   hydrochlorothiazide (HYDRODIURIL) 25 MG tablet TAKE 1 TABLET (25 MG TOTAL) BY MOUTH DAILY. 90 tablet 2   losartan (COZAAR) 100 MG tablet TAKE 1 TABLET (100 MG TOTAL) BY MOUTH DAILY.  90 tablet 2   metoprolol tartrate (LOPRESSOR) 50 MG tablet TAKE 1 TABLET (50 MG TOTAL) BY MOUTH 2 (TWO) TIMES DAILY. 180 tablet 2   Microlet Lancets MISC Use to check blood sugar once daily. 100 each 3   potassium chloride (KLOR-CON) 10 MEQ tablet TAKE 1 TABLET (10 MEQ TOTAL) BY MOUTH DAILY. 90 tablet 3   No current facility-administered medications on file prior to visit.    Objective:   Vitals:   01/24/23 0912 01/24/23 0915  BP: 137/80 132/83  Pulse: 83   Resp: 16   SpO2: 97%   Weight: 187 lb (84.8 kg)   Height: 5\' 7"  (1.702 m)     Comprehensive ROS Pertinent positive and negative noted in HPI   Exam General appearance : Awake, alert, not in any distress. Speech Clear. Not toxic looking HEENT: Atraumatic and Normocephalic, pupils equally reactive to light and accomodation Neck: Supple, no JVD. No cervical lymphadenopathy.  Chest: Good air entry bilaterally, no added sounds  CVS: S1 S2 regular, no murmurs.  Abdomen: Bowel sounds present, Non tender and not distended with no gaurding, rigidity or rebound. Extremities: B/L Lower Ext shows no edema, both legs are warm to touch Neurology: Awake alert, and oriented X 3, CN II-XII intact, Non focal Skin: No Rash  Data Review Lab Results  Component Value Date  HGBA1C 6.2 01/24/2023   HGBA1C 6.6 08/17/2022   HGBA1C 7.7 (A) 06/22/2022    Assessment & Plan  [name] was seen today for diabetes.  Diagnoses and all orders for this visit:  Type 2 diabetes mellitus with hyperosmolarity without coma, without long-term current use of insulin (HCC) Prediabetes is 5.7-6.4 monitor carbohydrates -rice, potatoes, tortillas, breads, pasta, sweets, sodas.  Increase exercising to help maintain appropriate weight.  -     POCT glycosylated hemoglobin (Hb A1C) -     Lipid Panel -     CBC with Differential -     glipiZIDE (GLUCOTROL) 10 MG tablet; Take 1 tablet (10 mg total) by mouth 2 (two) times daily. -     glucose blood (CONTOUR NEXT  TEST) test strip; Use to check blood sugar once daily. -     metFORMIN (GLUCOPHAGE-XR) 500 MG 24 hr tablet; Take 1 tablet (500 mg total) by mouth daily with breakfast.  Encounter for immunization -     Flu vaccine trivalent PF, 6mos and older(Flulaval,Afluria,Fluarix,Fluzone)  Screening for colon cancer -     Ambulatory referral to Gastroenterology  Essential hypertension BP goal - < 130/80 Explained that having normal blood pressure is the goal and medications are helping to get to goal and maintain normal blood pressure. DIET: Limit salt intake, read nutrition labels to check salt content, limit fried and high fatty foods  Avoid using multisymptom OTC cold preparations that generally contain sudafed which can rise BP. Consult with pharmacist on best cold relief products to use for persons with HTN EXERCISE Discussed incorporating exercise such as walking - 30 minutes most days of the week and can do in 10 minute intervals    -     CMP14+EGFR  Hyperlipidemia LDL goal <70 -     Lipid Panel     Patient have been counseled extensively about nutrition and exercise. Other issues discussed during this visit include: low cholesterol diet, weight control and daily exercise, foot care, annual eye examinations at Ophthalmology, importance of adherence with medications and regular follow-up. We also discussed long term complications of uncontrolled diabetes and hypertension.   No follow-ups on file.  The patient was given clear instructions to go to ER or return to medical center if symptoms don't improve, worsen or new problems develop. The patient verbalized understanding. The patient was told to call to get lab results if they haven't heard anything in the next week.   This note has been created with Education officer, environmental. Any transcriptional errors are unintentional.   [name], NP 01/24/2023, 11:39 AM

## 2023-01-24 NOTE — Patient Instructions (Signed)

## 2023-01-25 LAB — LIPID PANEL
Chol/HDL Ratio: 2.8 ratio (ref 0.0–5.0)
Cholesterol, Total: 93 mg/dL — ABNORMAL LOW (ref 100–199)
HDL: 33 mg/dL — ABNORMAL LOW
LDL Chol Calc (NIH): 41 mg/dL (ref 0–99)
Triglycerides: 95 mg/dL (ref 0–149)
VLDL Cholesterol Cal: 19 mg/dL (ref 5–40)

## 2023-01-25 LAB — CBC WITH DIFFERENTIAL/PLATELET
Basophils Absolute: 0 10*3/uL (ref 0.0–0.2)
Basos: 1 %
EOS (ABSOLUTE): 0.1 10*3/uL (ref 0.0–0.4)
Eos: 2 %
Hematocrit: 40.6 % (ref 37.5–51.0)
Hemoglobin: 13.4 g/dL (ref 13.0–17.7)
Immature Grans (Abs): 0 10*3/uL (ref 0.0–0.1)
Immature Granulocytes: 0 %
Lymphocytes Absolute: 2.1 10*3/uL (ref 0.7–3.1)
Lymphs: 28 %
MCH: 31.5 pg (ref 26.6–33.0)
MCHC: 33 g/dL (ref 31.5–35.7)
MCV: 96 fL (ref 79–97)
Monocytes Absolute: 0.5 10*3/uL (ref 0.1–0.9)
Monocytes: 7 %
Neutrophils Absolute: 4.9 10*3/uL (ref 1.4–7.0)
Neutrophils: 62 %
Platelets: 191 10*3/uL (ref 150–450)
RBC: 4.25 x10E6/uL (ref 4.14–5.80)
RDW: 12.8 % (ref 11.6–15.4)
WBC: 7.7 10*3/uL (ref 3.4–10.8)

## 2023-01-25 LAB — CMP14+EGFR
ALT: 18 IU/L (ref 0–44)
AST: 17 IU/L (ref 0–40)
Albumin: 4.6 g/dL (ref 3.8–4.9)
Alkaline Phosphatase: 94 IU/L (ref 44–121)
BUN/Creatinine Ratio: 14 (ref 10–24)
BUN: 13 mg/dL (ref 8–27)
Bilirubin Total: 0.9 mg/dL (ref 0.0–1.2)
CO2: 22 mmol/L (ref 20–29)
Calcium: 10.4 mg/dL — ABNORMAL HIGH (ref 8.6–10.2)
Chloride: 101 mmol/L (ref 96–106)
Creatinine, Ser: 0.92 mg/dL (ref 0.76–1.27)
Globulin, Total: 2.6 g/dL (ref 1.5–4.5)
Glucose: 98 mg/dL (ref 70–99)
Potassium: 4.3 mmol/L (ref 3.5–5.2)
Sodium: 140 mmol/L (ref 134–144)
Total Protein: 7.2 g/dL (ref 6.0–8.5)
eGFR: 95 mL/min/1.73

## 2023-02-05 LAB — COLOGUARD: COLOGUARD: NEGATIVE

## 2023-02-19 ENCOUNTER — Ambulatory Visit (INDEPENDENT_AMBULATORY_CARE_PROVIDER_SITE_OTHER): Payer: BLUE CROSS/BLUE SHIELD | Admitting: Primary Care

## 2023-04-12 ENCOUNTER — Other Ambulatory Visit: Payer: Self-pay | Admitting: Cardiology

## 2023-04-12 DIAGNOSIS — E785 Hyperlipidemia, unspecified: Secondary | ICD-10-CM

## 2023-05-08 ENCOUNTER — Other Ambulatory Visit: Payer: Self-pay | Admitting: Cardiology

## 2023-05-08 ENCOUNTER — Other Ambulatory Visit (INDEPENDENT_AMBULATORY_CARE_PROVIDER_SITE_OTHER): Payer: Self-pay | Admitting: Primary Care

## 2023-05-08 DIAGNOSIS — E785 Hyperlipidemia, unspecified: Secondary | ICD-10-CM

## 2023-05-08 DIAGNOSIS — E1100 Type 2 diabetes mellitus with hyperosmolarity without nonketotic hyperglycemic-hyperosmolar coma (NKHHC): Secondary | ICD-10-CM

## 2023-05-08 DIAGNOSIS — I2510 Atherosclerotic heart disease of native coronary artery without angina pectoris: Secondary | ICD-10-CM

## 2023-05-08 DIAGNOSIS — I10 Essential (primary) hypertension: Secondary | ICD-10-CM

## 2023-06-15 ENCOUNTER — Other Ambulatory Visit: Payer: Self-pay

## 2023-07-05 ENCOUNTER — Encounter: Payer: Self-pay | Admitting: Cardiology

## 2023-07-05 ENCOUNTER — Ambulatory Visit: Payer: BC Managed Care – PPO | Attending: Cardiology | Admitting: Cardiology

## 2023-07-05 ENCOUNTER — Other Ambulatory Visit: Payer: Self-pay

## 2023-07-05 VITALS — BP 120/70 | HR 64 | Resp 16 | Ht 67.0 in | Wt 189.0 lb

## 2023-07-05 DIAGNOSIS — R079 Chest pain, unspecified: Secondary | ICD-10-CM

## 2023-07-05 DIAGNOSIS — I10 Essential (primary) hypertension: Secondary | ICD-10-CM | POA: Diagnosis not present

## 2023-07-05 DIAGNOSIS — E785 Hyperlipidemia, unspecified: Secondary | ICD-10-CM | POA: Diagnosis not present

## 2023-07-05 DIAGNOSIS — I4891 Unspecified atrial fibrillation: Secondary | ICD-10-CM

## 2023-07-05 DIAGNOSIS — I9789 Other postprocedural complications and disorders of the circulatory system, not elsewhere classified: Secondary | ICD-10-CM

## 2023-07-05 DIAGNOSIS — I2510 Atherosclerotic heart disease of native coronary artery without angina pectoris: Secondary | ICD-10-CM

## 2023-07-05 MED ORDER — METOPROLOL TARTRATE 50 MG PO TABS: 50.0000 mg | ORAL_TABLET | Freq: Two times a day (BID) | ORAL | 3 refills | Status: AC

## 2023-07-05 MED ORDER — LOSARTAN POTASSIUM 100 MG PO TABS: 100.0000 mg | ORAL_TABLET | Freq: Every day | ORAL | 3 refills | Status: AC

## 2023-07-05 MED ORDER — HYDROCHLOROTHIAZIDE 25 MG PO TABS: 25.0000 mg | ORAL_TABLET | Freq: Every day | ORAL | 3 refills | Status: AC

## 2023-07-05 MED ORDER — EZETIMIBE 10 MG PO TABS: 10.0000 mg | ORAL_TABLET | Freq: Every day | ORAL | 3 refills | Status: AC

## 2023-07-05 MED ORDER — POTASSIUM CHLORIDE ER 10 MEQ PO TBCR: 10.0000 meq | EXTENDED_RELEASE_TABLET | Freq: Every day | ORAL | 3 refills | Status: DC

## 2023-07-05 MED ORDER — ATORVASTATIN CALCIUM 80 MG PO TABS: 80.0000 mg | ORAL_TABLET | Freq: Every day | ORAL | 3 refills | Status: AC

## 2023-07-05 NOTE — Progress Notes (Signed)
 Attestation for Cardiac PET stress CT to be signed by Dr. Mayford Knife.

## 2023-07-05 NOTE — Addendum Note (Signed)
 Addended by: Franchot Gallo on: 07/05/2023 08:34 AM   Modules accepted: Orders

## 2023-07-05 NOTE — Progress Notes (Signed)
 Date:  07/05/2023   ID:  [name], DOB 04/20/1960, MRN [id number] The patient was identified using 2 identifiers.  PCP:  [name], NP   [facility] Providers Cardiologist:  Armanda Magic, MD     Evaluation Performed:  Follow-Up Visit  Chief Complaint:  CAD, HTN, HLD  History of Present Illness:    This  encounter was assisted by a Nurse, learning disability.   [name] is a 63 y.o. male with  a hx of NSTEMI found to have severe three-vessel coronary artery disease and underwent CABG x5 with LIMA to the LAD, SVG to diag in June 2018 showed no recurrent atrial fibrillation.  His amiodarone and apixaban were stopped.  He is here today for followup and is doing well. He recently has been having chest pain with exertion.  There is no radiation of the pain.  There is no associated sx of nausea, diaphoresis or SOB with the pain.  The pain is very brief and occurs with sudden movements.  It is not like what he had prior to his CABG.   He denies any SOB, DOE, PND, orthopnea, LE edema ( except when sitting for long periods of time), palpitations or syncope. He is compliant with his meds and is tolerating meds with no SE.    Past Medical History:  Diagnosis Date   CAD (coronary artery disease), native coronary artery 09/20/2017   Diabetes mellitus without complication (HCC)    Hyperlipidemia    Hypertension    Past Surgical History:  Procedure Laterality Date   CORONARY ARTERY BYPASS GRAFT N/A 07/23/2017   Procedure: CORONARY ARTERY BYPASS GRAFTING (CABG) x Five , using left internal mammary artery and right leg greater saphenous vein harvested endoscopically;  Surgeon: [name], MD;  Location: [facility] OR;  Service: Open Heart Surgery;  Laterality: N/A;   LEFT HEART CATH AND CORONARY ANGIOGRAPHY N/A 07/22/2017   Procedure: LEFT HEART CATH AND CORONARY ANGIOGRAPHY;  Surgeon: Swazil[place], Peter M, MD;  Location: [facility] INVASIVE CV LAB;  Service: Cardiovascular;  Laterality: N/A;   TEE WITHOUT  CARDIOVERSION N/A 07/23/2017   Procedure: TRANSESOPHAGEAL ECHOCARDIOGRAM (TEE);  Surgeon: [name], MD;  Location: [facility] OR;  Service: Open Heart Surgery;  Laterality: N/A;     Current Meds  Medication Sig   acetaminophen (TYLENOL) 325 MG tablet Take 650 mg by mouth every 6 (six) hours as needed.   aspirin EC 81 MG EC tablet Take 1 tablet (81 mg total) by mouth daily.   atorvastatin (LIPITOR) 80 MG tablet TAKE 1 TABLET (80 MG TOTAL) BY MOUTH DAILY AT 6 PM.   ezetimibe (ZETIA) 10 MG tablet TAKE 1 TABLET (10 MG TOTAL) BY MOUTH DAILY.   glipiZIDE (GLUCOTROL) 10 MG tablet Take 1 tablet (10 mg total) by mouth 2 (two) times daily.   glucose blood (CONTOUR NEXT TEST) test strip Use to check blood sugar once daily.   hydrochlorothiazide (HYDRODIURIL) 25 MG tablet TAKE 1 TABLET (25 MG TOTAL) BY MOUTH DAILY.   losartan (COZAAR) 100 MG tablet TAKE 1 TABLET (100 MG TOTAL) BY MOUTH DAILY.   metFORMIN (GLUCOPHAGE-XR) 500 MG 24 hr tablet TAKE 1 TABLET (500 MG TOTAL) BY MOUTH DAILY WITH BREAKFAST.   metoprolol tartrate (LOPRESSOR) 50 MG tablet TAKE 1 TABLET (50 MG TOTAL) BY MOUTH 2 (TWO) TIMES DAILY.   Microlet Lancets MISC Use to check blood sugar once daily.   potassium chloride (KLOR-CON) 10 MEQ tablet TAKE 1 TABLET (10 MEQ TOTAL) BY MOUTH DAILY.  Allergies:   Tape   Social History   Tobacco Use   Smoking status: Former    Current packs/day: 0.00    Types: Cigarettes    Quit date: 07/22/1998    Years since quitting: 24.9   Smokeless tobacco: Never  Substance Use Topics   Alcohol use: Yes   Drug use: No     Family Hx: The patient's family history includes Heart disease in his mother.  ROS:   Please see the history of present illness.     All other systems reviewed and are negative.   Prior CV studies:   The following studies were reviewed today: EKG Interpretation Date/Time:  Thursday July 05 2023 08:07:55 EDT Ventricular Rate:  72 PR Interval:  154 QRS Duration:  96 QT  Interval:  404 QTC Calculation: 442 R Axis:   2  Text Interpretation: Normal sinus rhythm Normal ECG When compared with ECG of 06-Aug-2017 23:00, Premature ventricular complexes are no longer Present Questionable change in QRS axis T wave inversion no longer evident in Lateral leads QT has shortened Confirmed by Armanda Magic ([id number]) on 07/05/2023 8:22:10 AM    Labs/Other Tests and Data Reviewed:     Recent Labs: 01/24/2023: ALT 18; BUN 13; Creatinine, Ser 0.92; Hemoglobin 13.4; Platelets 191; Potassium 4.3; Sodium 140   Recent Lipid Panel Lab Results  Component Value Date/Time   CHOL 93 (L) 01/24/2023 09:42 AM   TRIG 95 01/24/2023 09:42 AM   HDL 33 (L) 01/24/2023 09:42 AM   CHOLHDL 2.8 01/24/2023 09:42 AM   CHOLHDL 3.4 07/22/2017 03:03 AM   LDLCALC 41 01/24/2023 09:42 AM    Wt Readings from Last 3 Encounters:  07/05/23 189 lb (85.7 kg)  01/24/23 187 lb (84.8 kg)  08/17/22 186 lb 9.6 oz (84.6 kg)     Risk Assessment/Calculations:          Objective:    Vital Signs:  BP 120/70 (BP Location: Left Arm, Patient Position: Sitting, Cuff Size: Large)   Pulse 64   Resp 16   Ht 5\' 7"  (1.702 m)   Wt 189 lb (85.7 kg)   SpO2 97%   BMI 29.60 kg/m   GEN: Well nourished, well developed in no acute distress HEENT: Normal NECK: No JVD; No carotid bruits LYMPHATICS: No lymphadenopathy CARDIAC:RRR, no murmurs, rubs, gallops RESPIRATORY:  Clear to auscultation without rales, wheezing or rhonchi  ABDOMEN: Soft, non-tender, non-distended MUSCULOSKELETAL:  No edema; No deformity  SKIN: Warm and dry NEUROLOGIC:  Alert and oriented x 3 PSYCHIATRIC:  Normal affect  ASSESSMENT & PLAN:    1.  ASCAD -severe 3V coronary artery disease and underwent CABG x5 with LIMA to the LAD, SVG to diagonal, SVG to OM1 and OM 2, SVG to RCA 07/24/2017. -Recently he has been having some atypical chest pain.  It is exertional but when he moves and only lasts a few seconds.  He does not think it is like what  he had when he had his CABG -I think we should get a Stress PET CT to rule out CAD given he is 6 years out form CABG and with the language barrier cannot be certain regarding his symptom description. -Informed Consent   Shared Decision Making/Informed Consent The risks [chest pain, shortness of breath, cardiac arrhythmias, dizziness, blood pressure fluctuations, myocardial infarction, stroke/transient ischemic attack, nausea, vomiting, allergic reaction, radiation exposure, metallic taste sensation and life-threatening complications (estimated to be 1 in 10,000)], benefits (risk stratification, diagnosing coronary artery disease, treatment guidance)  and alternatives of a cardiac PET stress test were discussed in detail with Mr. Baggerly and he agrees to proceed. -Continue prescription drug management with aspirin 81 mg daily, atorvastatin 80 mg daily, Lopressor 50 mg twice daily with as needed refills  2.  HTN -BP well-controlled on exam today -Continue drug management with HCTZ 25 mg daily, losartan 100 mg daily, Lopressor 50 mg twice daily with as needed refills -I have personally reviewed and interpreted outside labs performed by patient's PCP which showed serum creatinine 0.92 and potassium 4.3 on 01/24/2023  3.  Postop afib -occurred after CABG -He is maintaining normal sinus rhythm with no recurrence of palpitations   4.  HLD -LDL goal < 70 -I have personally reviewed and interpreted outside labs performed by patient's PCP which showed LDL 41, HDL 33 and ALT 18 on 01/24/2023 -Continue drug management with atorvastatin 80 mg daily and Zetia 10 mg daily with as needed refills       Medication Adjustments/Labs and Tests Ordered: Current medicines are reviewed at length with the patient today.  Concerns regarding medicines are outlined above.   Tests Ordered: Orders Placed This Encounter  Procedures   EKG 12-Lead    Medication Changes: No orders of the defined types were placed in  this encounter.   Follow Up:  In Person in 1 year(s)  Signed, Armanda Magic, MD  07/05/2023 8:19 AM    [facility]

## 2023-07-05 NOTE — Patient Instructions (Addendum)
 Medication Instructions:  Your physician recommends that you continue on your current medications as directed. Please refer to the Current Medication list given to you today.  *If you need a refill on your cardiac medications before your next appointment, please call your pharmacy*  Testing/Procedures: Your physician has requested that you have a Cardiac PET stress test performed. A scheduler will call you to schedule this test.  Follow-Up: At [facility], you and your health needs are our priority.  As part of our continuing mission to provide you with exceptional heart care, we have created designated Provider Care Teams.  These Care Teams include your primary Cardiologist (physician) and Advanced Practice Providers (APPs -  Physician Assistants and Nurse Practitioners) who all work together to provide you with the care you need, when you need it.  We recommend signing up for the patient portal called "MyChart".  Sign up information is provided on this After Visit Summary.  MyChart is used to connect with patients for Virtual Visits (Telemedicine).  Patients are able to view lab/test results, encounter notes, upcoming appointments, etc.  Non-urgent messages can be sent to your provider as well.   To learn more about what you can do with MyChart, go to [email or web address].    Your next appointment:   1 year(s)  The format for your next appointment:   In Person  Provider:   Armanda Magic, MD {  Other Instructions    Please report to Radiology at the [facility] Main Entrance 30 minutes early for your test.  [address] [place], [place] [address]                         OR   Please report to Radiology at [facility] Main Entrance, medical mall, 30 mins prior to your test.  [address]  [place], [place]  How to Prepare for Your Cardiac PET/CT Stress Test:  Nothing to eat or drink, except water, 3 hours prior to arrival time.  NO  caffeine/decaffeinated products, or chocolate 12 hours prior to arrival. (Please note decaffeinated beverages (teas/coffees) still contain caffeine).  If you have caffeine within 12 hours prior, the test will need to be rescheduled.  Medication instructions: Do not take erectile dysfunction medications for 72 hours prior to test (sildenafil, tadalafil) Do not take nitrates (isosorbide mononitrate, Ranexa) the day before or day of test Do not take tamsulosin the day before or morning of test Hold theophylline containing medications for 12 hours. Hold Dipyridamole 48 hours prior to the test.  Diabetic Preparation: If able to eat breakfast prior to 3 hour fasting, you may take all medications, including your insulin. Do not worry if you miss your breakfast dose of insulin - start at your next meal. If you do not eat prior to 3 hour fast-Hold all diabetes (oral and insulin) medications. Patients who wear a continuous glucose monitor MUST remove the device prior to scanning.  You may take your remaining medications with water.  NO perfume, cologne or lotion on chest or abdomen area. FEMALES - Please avoid wearing dresses to this appointment.  Total time is 1 to 2 hours; you may want to bring reading material for the waiting time.  IF YOU THINK YOU MAY BE PREGNANT, OR ARE NURSING PLEASE INFORM THE TECHNOLOGIST.  In preparation for your appointment, medication and supplies will be purchased.  Appointment availability is limited, so if you need to cancel or reschedule,  please call the Radiology Department Scheduler at [phone] 24 hours in advance to avoid a cancellation fee of $100.00  What to Expect When you Arrive:  Once you arrive and check in for your appointment, you will be taken to a preparation room within the Radiology Department.  A technologist or Nurse will obtain your medical history, verify that you are correctly prepped for the exam, and explain the procedure.  Afterwards, an  IV will be started in your arm and electrodes will be placed on your skin for EKG monitoring during the stress portion of the exam. Then you will be escorted to the PET/CT scanner.  There, staff will get you positioned on the scanner and obtain a blood pressure and EKG.  During the exam, you will continue to be connected to the EKG and blood pressure machines.  A small, safe amount of a radioactive tracer will be injected in your IV to obtain a series of pictures of your heart along with an injection of a stress agent.    After your Exam:  It is recommended that you eat a meal and drink a caffeinated beverage to counter act any effects of the stress agent.  Drink plenty of fluids for the remainder of the day and urinate frequently for the first couple of hours after the exam.  Your doctor will inform you of your test results within 7-10 business days.  For more information and frequently asked questions, please visit our website: [email or web address]  For questions about your test or how to prepare for your test, please call: Cardiac Imaging Nurse Navigators Office: [phone]     1st Floor: - Lobby - Registration  - Pharmacy  - Lab - Cafe  2nd Floor: - PV Lab - Diagnostic Testing (echo, CT, nuclear med)  3rd Floor: - Vacant  4th Floor: - TCTS (cardiothoracic surgery) - AFib Clinic - Structural Heart Clinic - Vascular Surgery  - Vascular Ultrasound  5th Floor: - HeartCare Cardiology (general and EP) - Clinical Pharmacy for coumadin, hypertension, lipid, weight-loss medications, and med management appointments    Valet parking services will be available as well.

## 2023-07-25 ENCOUNTER — Ambulatory Visit (INDEPENDENT_AMBULATORY_CARE_PROVIDER_SITE_OTHER): Payer: Self-pay | Admitting: Primary Care

## 2023-07-25 ENCOUNTER — Encounter (INDEPENDENT_AMBULATORY_CARE_PROVIDER_SITE_OTHER): Payer: Self-pay | Admitting: Primary Care

## 2023-07-25 VITALS — BP 133/80 | HR 68 | Resp 16 | Ht 67.0 in | Wt 187.8 lb

## 2023-07-25 DIAGNOSIS — I10 Essential (primary) hypertension: Secondary | ICD-10-CM

## 2023-07-25 DIAGNOSIS — E785 Hyperlipidemia, unspecified: Secondary | ICD-10-CM | POA: Diagnosis not present

## 2023-07-25 DIAGNOSIS — Z7984 Long term (current) use of oral hypoglycemic drugs: Secondary | ICD-10-CM | POA: Diagnosis not present

## 2023-07-25 DIAGNOSIS — E1100 Type 2 diabetes mellitus with hyperosmolarity without nonketotic hyperglycemic-hyperosmolar coma (NKHHC): Secondary | ICD-10-CM | POA: Diagnosis not present

## 2023-07-25 DIAGNOSIS — Z23 Encounter for immunization: Secondary | ICD-10-CM

## 2023-07-25 NOTE — Progress Notes (Signed)
 [facility]  [name], is a 63 y.o. male  ZOX:[id number]  WJX:[id number]  DOB - 06-05-1960  Chief Complaint  Patient presents with   Diabetes   Hypertension   Hyperlipidemia       Subjective:   [name] is a 63 y.o. Hispanic male (Interpreter Loreda Rodriguez [phone] personal phone used) here today for a follow up visit. HTN- Patient has No headache, No abdominal pain - No Nausea, No new weakness tingling or numbness, No Cough - shortness of breath.  Patient did state he has chest pain and his cardiologist is aware.T2D- Denies polyuria, polydipsia, or vision changes. He endorses polyphagia. Does check blood sugars at home. (127-164) No problems updated.  Comprehensive ROS Pertinent positive and negative noted in HPI   Allergies  Allergen Reactions   Tape     Past Medical History:  Diagnosis Date   CAD (coronary artery disease), native coronary artery 09/20/2017   Diabetes mellitus without complication (HCC)    Hyperlipidemia    Hypertension     Current Outpatient Medications on File Prior to Visit  Medication Sig Dispense Refill   acetaminophen  (TYLENOL ) 325 MG tablet Take 650 mg by mouth every 6 (six) hours as needed.     aspirin  EC 81 MG EC tablet Take 1 tablet (81 mg total) by mouth daily.     atorvastatin  (LIPITOR ) 80 MG tablet Take 1 tablet (80 mg total) by mouth daily at 6 PM. 90 tablet 3   diclofenac  (VOLTAREN ) 75 MG EC tablet Take 1 tablet (75 mg total) by mouth 2 (two) times daily with a meal. (Patient not taking: Reported on 07/05/2023) 10 tablet 0   ezetimibe  (ZETIA ) 10 MG tablet Take 1 tablet (10 mg total) by mouth daily. 90 tablet 3   glipiZIDE  (GLUCOTROL ) 10 MG tablet Take 1 tablet (10 mg total) by mouth 2 (two) times daily. 180 tablet 1   glucose blood (CONTOUR NEXT TEST) test strip Use to check blood sugar once daily. 100 each 2   hydrochlorothiazide  (HYDRODIURIL ) 25 MG tablet Take 1 tablet (25 mg total) by mouth daily. 90 tablet 3    losartan  (COZAAR ) 100 MG tablet Take 1 tablet (100 mg total) by mouth daily. 90 tablet 3   metFORMIN  (GLUCOPHAGE -XR) 500 MG 24 hr tablet TAKE 1 TABLET (500 MG TOTAL) BY MOUTH DAILY WITH BREAKFAST. 90 tablet 1   metoprolol  tartrate (LOPRESSOR ) 50 MG tablet Take 1 tablet (50 mg total) by mouth 2 (two) times daily. 180 tablet 3   Microlet Lancets MISC Use to check blood sugar once daily. 100 each 3   potassium chloride  (KLOR-CON ) 10 MEQ tablet Take 1 tablet (10 mEq total) by mouth daily. 90 tablet 3   No current facility-administered medications on file prior to visit.   Health Maintenance  Topic Date Due   Eye exam for diabetics  Never done   COVID-19 Vaccine (1 - 2024-25 season) Never done   Yearly kidney health urinalysis for diabetes  05/18/2023   Hemoglobin A1C  07/25/2023   Complete foot exam   08/17/2023   Flu Shot  11/02/2023   Yearly kidney function blood test for diabetes  01/24/2024   Cologuard (Stool DNA test)  01/23/2026   DTaP/Tdap/Td vaccine (3 - Td or Tdap) 10/01/2032   Pneumococcal Vaccination  Completed   Hepatitis C Screening  Completed   HIV Screening  Completed   Zoster (Shingles) Vaccine  Completed   HPV Vaccine  Aged Out   Meningitis B Vaccine  Aged Out    Objective:   Vitals:   07/25/23 0935 07/25/23 0937  BP: (!) 146/85 133/80  Pulse: 68   Resp: 16   SpO2: 99%   Weight: 187 lb 12.8 oz (85.2 kg)   Height: 5\' 7"  (1.702 m)    BP Readings from Last 3 Encounters:  07/25/23 133/80  07/05/23 120/70  01/24/23 132/83   Physical Exam Vitals reviewed.  Constitutional:      Appearance: He is obese.  HENT:     Head: Normocephalic.     Right Ear: Tympanic membrane and external ear normal.     Left Ear: Tympanic membrane and external ear normal.     Nose: Nose normal.  Eyes:     Extraocular Movements: Extraocular movements intact.     Pupils: Pupils are equal, round, and reactive to light.  Cardiovascular:     Rate and Rhythm: Normal rate and regular  rhythm.  Pulmonary:     Effort: Pulmonary effort is normal.     Breath sounds: Normal breath sounds.  Abdominal:     General: Bowel sounds are normal. There is distension.     Palpations: Abdomen is soft.  Musculoskeletal:        General: Normal range of motion.  Skin:    General: Skin is warm and dry.  Neurological:     Mental Status: He is oriented to person, place, and time.  Psychiatric:        Mood and Affect: Mood normal.        Behavior: Behavior normal.        Thought Content: Thought content normal.        Judgment: Judgment normal.     Assessment & Plan  [name] was seen today for diabetes, hypertension and hyperlipidemia.  Diagnoses and all orders for this visit:  Type 2 diabetes mellitus with hyperosmolarity without coma, without long-term current use of insulin  (HCC) - educated on lifestyle modifications, including but not limited to diet choices and adding exercise to daily routine.   -     CMP14+EGFR -     Lipid panel -     Hemoglobin A1c -     Microalbumin / creatinine urine ratio  Essential hypertension Managed by cardiology  -     CBC with Differential/Platelet -     CMP14+EGFR  Hyperlipidemia LDL goal <70 Managed by cardiology  -     Lipid panel  Encounter for immunization -     Varicella-zoster vaccine IM -     Pneumococcal conjugate vaccine 20-valent  Patient have been counseled extensively about nutrition and exercise. Other issues discussed during this visit include: low cholesterol diet, weight control and daily exercise, foot care, annual eye examinations at Ophthalmology, importance of adherence with medications and regular follow-up. We also discussed long term complications of uncontrolled diabetes and hypertension.   Return in about 6 months (around 01/24/2024).  The patient was given clear instructions to go to ER or return to medical center if symptoms don't improve, worsen or new problems develop. The patient verbalized understanding.  The patient was told to call to get lab results if they haven't heard anything in the next week.   This note has been created with Education officer, environmental. Any transcriptional errors are unintentional.   Marius Siemens, NP 07/25/2023, 10:39 AM

## 2023-07-28 ENCOUNTER — Other Ambulatory Visit (INDEPENDENT_AMBULATORY_CARE_PROVIDER_SITE_OTHER): Payer: Self-pay | Admitting: Primary Care

## 2023-07-28 ENCOUNTER — Encounter (INDEPENDENT_AMBULATORY_CARE_PROVIDER_SITE_OTHER): Payer: Self-pay | Admitting: Primary Care

## 2023-07-28 DIAGNOSIS — E1100 Type 2 diabetes mellitus with hyperosmolarity without nonketotic hyperglycemic-hyperosmolar coma (NKHHC): Secondary | ICD-10-CM

## 2023-07-28 LAB — MICROALBUMIN / CREATININE URINE RATIO
Creatinine, Urine: 111.1 mg/dL
Microalb/Creat Ratio: 7 mg/g{creat} (ref 0–29)
Microalbumin, Urine: 8 ug/mL

## 2023-07-28 LAB — CBC WITH DIFFERENTIAL/PLATELET
Basophils Absolute: 0.1 10*3/uL (ref 0.0–0.2)
Basos: 1 %
EOS (ABSOLUTE): 0.2 10*3/uL (ref 0.0–0.4)
Eos: 3 %
Hematocrit: 42 % (ref 37.5–51.0)
Hemoglobin: 13.9 g/dL (ref 13.0–17.7)
Immature Grans (Abs): 0 10*3/uL (ref 0.0–0.1)
Immature Granulocytes: 0 %
Lymphocytes Absolute: 2.3 10*3/uL (ref 0.7–3.1)
Lymphs: 30 %
MCH: 31.6 pg (ref 26.6–33.0)
MCHC: 33.1 g/dL (ref 31.5–35.7)
MCV: 96 fL (ref 79–97)
Monocytes Absolute: 0.5 10*3/uL (ref 0.1–0.9)
Monocytes: 7 %
Neutrophils Absolute: 4.6 10*3/uL (ref 1.4–7.0)
Neutrophils: 59 %
Platelets: 169 10*3/uL (ref 150–450)
RBC: 4.4 x10E6/uL (ref 4.14–5.80)
RDW: 12.4 % (ref 11.6–15.4)
WBC: 7.6 10*3/uL (ref 3.4–10.8)

## 2023-07-28 LAB — LIPID PANEL
Chol/HDL Ratio: 2.9 ratio (ref 0.0–5.0)
Cholesterol, Total: 97 mg/dL — ABNORMAL LOW (ref 100–199)
HDL: 34 mg/dL — ABNORMAL LOW (ref >39)
LDL Chol Calc (NIH): 45 mg/dL (ref 0–99)
Triglycerides: 89 mg/dL (ref 0–149)
VLDL Cholesterol Cal: 18 mg/dL (ref 5–40)

## 2023-07-28 LAB — CMP14+EGFR
ALT: 22 IU/L (ref 0–44)
AST: 18 IU/L (ref 0–40)
Albumin: 4.4 g/dL (ref 3.8–4.9)
Alkaline Phosphatase: 103 IU/L (ref 44–121)
BUN/Creatinine Ratio: 13 (ref 10–24)
BUN: 14 mg/dL (ref 8–27)
Bilirubin Total: 1 mg/dL (ref 0.0–1.2)
CO2: 25 mmol/L (ref 20–29)
Calcium: 9.3 mg/dL (ref 8.6–10.2)
Chloride: 102 mmol/L (ref 96–106)
Creatinine, Ser: 1.06 mg/dL (ref 0.76–1.27)
Globulin, Total: 2.5 g/dL (ref 1.5–4.5)
Glucose: 148 mg/dL — ABNORMAL HIGH (ref 70–99)
Potassium: 4.3 mmol/L (ref 3.5–5.2)
Sodium: 139 mmol/L (ref 134–144)
Total Protein: 6.9 g/dL (ref 6.0–8.5)
eGFR: 80 mL/min/{1.73_m2} (ref >59)

## 2023-07-28 LAB — HEMOGLOBIN A1C
Est. average glucose Bld gHb Est-mCnc: 171 mg/dL
Hgb A1c MFr Bld: 7.6 % — ABNORMAL HIGH (ref 4.8–5.6)

## 2023-07-28 MED ORDER — GLIPIZIDE 10 MG PO TABS: 10.0000 mg | ORAL_TABLET | Freq: Two times a day (BID) | ORAL | 1 refills | Status: DC

## 2023-08-28 ENCOUNTER — Telehealth (INDEPENDENT_AMBULATORY_CARE_PROVIDER_SITE_OTHER): Payer: Self-pay | Admitting: Primary Care

## 2023-08-28 NOTE — Telephone Encounter (Signed)
 Will forward to provider

## 2023-08-28 NOTE — Telephone Encounter (Signed)
 Copied from CRM [id number]. Topic: Clinical - Medication Question >> Aug 28, 2023  3:56 PM Lizabeth Riggs wrote: Reason for CRM:  Please call daughter, Marcelene Sep at [phone] in the morning time. She has a question about dad's sugar level. It went up to 180 today. Should he increase his medication or take something different?

## 2023-09-06 ENCOUNTER — Telehealth (INDEPENDENT_AMBULATORY_CARE_PROVIDER_SITE_OTHER): Payer: Self-pay | Admitting: Primary Care

## 2023-09-06 NOTE — Telephone Encounter (Signed)
 Will forward to provider

## 2023-09-06 NOTE — Telephone Encounter (Signed)
 Daughter provided me with this information to pass along to provider. Pt's daughter stated that Blood sugar is elevated to 180-190.  Pt wants to increase daily dose to twice a day. Please advise.

## 2023-09-29 ENCOUNTER — Encounter (HOSPITAL_COMMUNITY): Payer: Self-pay

## 2023-10-01 ENCOUNTER — Other Ambulatory Visit (INDEPENDENT_AMBULATORY_CARE_PROVIDER_SITE_OTHER): Payer: Self-pay | Admitting: Primary Care

## 2023-10-01 ENCOUNTER — Telehealth (HOSPITAL_COMMUNITY): Payer: Self-pay | Admitting: *Deleted

## 2023-10-01 DIAGNOSIS — E1100 Type 2 diabetes mellitus with hyperosmolarity without nonketotic hyperglycemic-hyperosmolar coma (NKHHC): Secondary | ICD-10-CM

## 2023-10-01 NOTE — Telephone Encounter (Signed)
 Reaching out to patient (via Spanish interpreter: Ivayda ID [phone]) to offer assistance regarding upcoming cardiac imaging study; pt verbalizes understanding of appt date/time, parking situation and where to check in, pre-test NPO status and verified current allergies; name and call back number provided for further questions should they arise  Chantal Requena RN Navigator Cardiac Imaging [facility] [phone] office [phone] cell  Patient aware to avoid caffeine 12 hours prior to his cardiac PET study.

## 2023-10-02 ENCOUNTER — Ambulatory Visit (HOSPITAL_COMMUNITY)
Admission: RE | Admit: 2023-10-02 | Discharge: 2023-10-02 | Disposition: A | Discharge status: Home / Self Care | Source: Ambulatory Visit | Attending: Cardiology | Admitting: Cardiology

## 2023-10-02 ENCOUNTER — Telehealth (INDEPENDENT_AMBULATORY_CARE_PROVIDER_SITE_OTHER): Payer: Self-pay | Admitting: Primary Care

## 2023-10-02 DIAGNOSIS — R079 Chest pain, unspecified: Secondary | ICD-10-CM | POA: Insufficient documentation

## 2023-10-02 DIAGNOSIS — E1100 Type 2 diabetes mellitus with hyperosmolarity without nonketotic hyperglycemic-hyperosmolar coma (NKHHC): Secondary | ICD-10-CM

## 2023-10-02 LAB — NM PET CT CARDIAC PERFUSION MULTI W/ABSOLUTE BLOODFLOW
LV dias vol: 100 mL (ref 62–150)
LV sys vol: 47 mL (ref 4.2–5.8)
MBFR: 3.51
Nuc Rest EF: 53 %
Nuc Stress EF: 63 %
Peak HR: 93 {beats}/min
Rest HR: 71 {beats}/min
Rest MBF: 0.82 ml/g/min
Rest Nuclear Isotope Dose: 22 mCi
ST Depression (mm): 0 mm
Stress MBF: 2.88 ml/g/min
Stress Nuclear Isotope Dose: 22 mCi

## 2023-10-02 MED ORDER — REGADENOSON 0.4 MG/5ML IV SOLN
INTRAVENOUS | Status: AC
  Filled 2023-10-02: qty 5

## 2023-10-02 MED ORDER — RUBIDIUM RB82 GENERATOR (RUBYFILL)
21.9500 | PACK | Freq: Once | INTRAVENOUS | Status: AC
  Administered 2023-10-02: 21.95 via INTRAVENOUS

## 2023-10-02 MED ORDER — REGADENOSON 0.4 MG/5ML IV SOLN
0.4000 mg | Freq: Once | INTRAVENOUS | Status: AC
  Administered 2023-10-02: 0.4 mg via INTRAVENOUS

## 2023-10-02 MED ORDER — RUBIDIUM RB82 GENERATOR (RUBYFILL)
22.0000 | PACK | Freq: Once | INTRAVENOUS | Status: AC
  Administered 2023-10-02: 21.95 via INTRAVENOUS

## 2023-10-02 NOTE — Telephone Encounter (Unsigned)
 Copied from CRM [id number]. Topic: Clinical - Medication Refill >> Oct 02, 2023  1:47 PM Essie A wrote: Medication: metFORMIN  (GLUCOPHAGE -XR) 500 MG 24 hr tablet - need 1000 mg because he has to take it 2 times a day  Has the patient contacted their pharmacy? Yes (Agent: If no, request that the patient contact the pharmacy for the refill. If patient does not wish to contact the pharmacy document the reason why and proceed with request.) (Agent: If yes, when and what did the pharmacy advise?)  This is the patient's preferred pharmacy:  [facility] - [place], [place] - [address] [place] [address] Phone: [phone] Fax: [phone]  Is this the correct pharmacy for this prescription? Yes If no, delete pharmacy and type the correct one.   Has the prescription been filled recently? Yes  Is the patient out of the medication? No  Has the patient been seen for an appointment in the last year OR does the patient have an upcoming appointment? Yes  Can we respond through MyChart? No  Agent: Please be advised that Rx refills may take up to 3 business days. We ask that you follow-up with your pharmacy.

## 2023-10-02 NOTE — Progress Notes (Signed)
 Pt. Tolerated lexi scan well.

## 2023-10-03 ENCOUNTER — Ambulatory Visit: Payer: Self-pay

## 2023-10-03 NOTE — Telephone Encounter (Signed)
 Requested Prescriptions  Pending Prescriptions Disp Refills   metFORMIN  (GLUCOPHAGE -XR) 500 MG 24 hr tablet [Pharmacy Med Name: METFORMIN  HCL ER 500 MG ORAL TABLET EXTENDED RELEASE 24 HOUR] 90 tablet 1    Sig: TAKE 1 TABLET (500 MG TOTAL) BY MOUTH DAILY WITH BREAKFAST.     Endocrinology:  Diabetes - Biguanides Failed - 10/03/2023 11:00 AM      Failed - B12 Level in normal range and within 720 days    No results found for: VITAMINB12       Passed - Cr in normal range and within 360 days    Creatinine, Ser  Date Value Ref Range Status  07/25/2023 1.06 0.76 - 1.27 mg/dL Final         Passed - HBA1C is between 0 and 7.9 and within 180 days    HbA1c, POC (controlled diabetic range)  Date Value Ref Range Status  01/24/2023 6.2 0.0 - 7.0 % Final   Hgb A1c MFr Bld  Date Value Ref Range Status  07/25/2023 7.6 (H) 4.8 - 5.6 % Final    Comment:             Prediabetes: 5.7 - 6.4          Diabetes: >6.4          Glycemic control for adults with diabetes: <7.0          Passed - eGFR in normal range and within 360 days    GFR calc Af Amer  Date Value Ref Range Status  02/12/2020 99 >59 mL/min/1.73 Final    Comment:    **In accordance with recommendations from the NKF-ASN Task force,**   [facility] is in the process of updating its eGFR calculation to the   2021 CKD-EPI creatinine equation that estimates kidney function   without a race variable.    GFR calc non Af Amer  Date Value Ref Range Status  02/12/2020 85 >59 mL/min/1.73 Final   eGFR  Date Value Ref Range Status  07/25/2023 80 >59 mL/min/1.73 Final         Passed - Valid encounter within last 6 months    Recent Outpatient Visits           2 months ago Type 2 diabetes mellitus with hyperosmolarity without coma, without long-term current use of insulin  (HCC)   [place] [facility] Celestia Rosaline SQUIBB, NP   8 months ago Type 2 diabetes mellitus with hyperosmolarity without coma, without long-term current  use of insulin  [facility])   [place] [facility] Celestia Rosaline SQUIBB, NP   1 year ago Type 2 diabetes mellitus with hyperosmolarity without coma, without long-term current use of insulin  [facility])   [place] [facility] Celestia Rosaline SQUIBB, NP   1 year ago Type 2 diabetes mellitus with hyperosmolarity without coma, without long-term current use of insulin  [facility])   [facility] [place]. [facility] Fleeta Tonia Senior L, RPH-CPP   1 year ago Type 2 diabetes mellitus with hyperosmolarity without coma, without long-term current use of insulin  [facility])   [place] [facility] [place]. Navos Bassett, [place] L, RPH-CPP              Passed - CBC within normal limits and completed in the last 12 months    WBC  Date Value Ref Range Status  07/25/2023 7.6 3.4 - 10.8 x10E3/uL Final  08/06/2017 12.5 (H) 4.0 - 10.5 K/uL Final   RBC  Date Value Ref Range Status  07/25/2023 4.40 4.14 - 5.80 x10E6/uL Final  08/06/2017 3.68 (L) 4.22 - 5.81 MIL/uL Final   Hemoglobin  Date Value Ref Range Status  07/25/2023 13.9 13.0 - 17.7 g/dL Final   Hematocrit  Date Value Ref Range Status  07/25/2023 42.0 37.5 - 51.0 % Final   MCHC  Date Value Ref Range Status  07/25/2023 33.1 31.5 - 35.7 g/dL Final  94/93/7980 66.2 30.0 - 36.0 g/dL Final   [facility]  Date Value Ref Range Status  07/25/2023 31.6 26.6 - 33.0 pg Final  08/06/2017 31.3 26.0 - 34.0 pg Final   MCV  Date Value Ref Range Status  07/25/2023 96 79 - 97 fL Final   No results found for: PLTCOUNTKUC, LABPLAT, POCPLA RDW  Date Value Ref Range Status  07/25/2023 12.4 11.6 - 15.4 % Final

## 2023-10-04 NOTE — Telephone Encounter (Signed)
 Refilled 10/03/23. Requested Prescriptions  Refused Prescriptions Disp Refills   metFORMIN  (GLUCOPHAGE -XR) 500 MG 24 hr tablet 90 tablet 1    Sig: Take 1 tablet (500 mg total) by mouth daily with breakfast.     Endocrinology:  Diabetes - Biguanides Failed - 10/04/2023 12:50 PM      Failed - B12 Level in normal range and within 720 days    No results found for: VITAMINB12       Passed - Cr in normal range and within 360 days    Creatinine, Ser  Date Value Ref Range Status  07/25/2023 1.06 0.76 - 1.27 mg/dL Final         Passed - HBA1C is between 0 and 7.9 and within 180 days    HbA1c, POC (controlled diabetic range)  Date Value Ref Range Status  01/24/2023 6.2 0.0 - 7.0 % Final   Hgb A1c MFr Bld  Date Value Ref Range Status  07/25/2023 7.6 (H) 4.8 - 5.6 % Final    Comment:             Prediabetes: 5.7 - 6.4          Diabetes: >6.4          Glycemic control for adults with diabetes: <7.0          Passed - eGFR in normal range and within 360 days    GFR calc Af Amer  Date Value Ref Range Status  02/12/2020 99 >59 mL/min/1.73 Final    Comment:    **In accordance with recommendations from the NKF-ASN Task force,**   [facility] is in the process of updating its eGFR calculation to the   2021 CKD-EPI creatinine equation that estimates kidney function   without a race variable.    GFR calc non Af Amer  Date Value Ref Range Status  02/12/2020 85 >59 mL/min/1.73 Final   eGFR  Date Value Ref Range Status  07/25/2023 80 >59 mL/min/1.73 Final         Passed - Valid encounter within last 6 months    Recent Outpatient Visits           2 months ago Type 2 diabetes mellitus with hyperosmolarity without coma, without long-term current use of insulin  (HCC)   [place] [facility] Celestia Rosaline SQUIBB, NP   8 months ago Type 2 diabetes mellitus with hyperosmolarity without coma, without long-term current use of insulin  (HCC)   [place] [facility]  Celestia Rosaline SQUIBB, NP   1 year ago Type 2 diabetes mellitus with hyperosmolarity without coma, without long-term current use of insulin  (HCC)   [place] [facility] Celestia Rosaline SQUIBB, NP   1 year ago Type 2 diabetes mellitus with hyperosmolarity without coma, without long-term current use of insulin  [facility])   [place] [facility] Of [place]. [facility] Fleeta Tonia Senior L, RPH-CPP   1 year ago Type 2 diabetes mellitus with hyperosmolarity without coma, without long-term current use of insulin  [facility])   [place] [facility] [place]. [facility] [place], [place] L, RPH-CPP              Passed - CBC within normal limits and completed in the last 12 months    WBC  Date Value Ref Range Status  07/25/2023 7.6 3.4 - 10.8 x10E3/uL Final  08/06/2017 12.5 (H) 4.0 - 10.5 K/uL Final   RBC  Date Value Ref Range Status  07/25/2023 4.40 4.14 - 5.80 x10E6/uL Final  08/06/2017 3.68 (L) 4.22 - 5.81 MIL/uL Final   Hemoglobin  Date Value Ref Range Status  07/25/2023 13.9 13.0 - 17.7 g/dL Final   Hematocrit  Date Value Ref Range Status  07/25/2023 42.0 37.5 - 51.0 % Final   MCHC  Date Value Ref Range Status  07/25/2023 33.1 31.5 - 35.7 g/dL Final  94/93/7980 66.2 30.0 - 36.0 g/dL Final   [facility]  Date Value Ref Range Status  07/25/2023 31.6 26.6 - 33.0 pg Final  08/06/2017 31.3 26.0 - 34.0 pg Final   MCV  Date Value Ref Range Status  07/25/2023 96 79 - 97 fL Final   No results found for: PLTCOUNTKUC, LABPLAT, POCPLA RDW  Date Value Ref Range Status  07/25/2023 12.4 11.6 - 15.4 % Final

## 2023-10-10 ENCOUNTER — Other Ambulatory Visit (INDEPENDENT_AMBULATORY_CARE_PROVIDER_SITE_OTHER): Payer: Self-pay | Admitting: Primary Care

## 2023-10-10 DIAGNOSIS — E1100 Type 2 diabetes mellitus with hyperosmolarity without nonketotic hyperglycemic-hyperosmolar coma (NKHHC): Secondary | ICD-10-CM

## 2023-10-10 NOTE — Telephone Encounter (Signed)
 Copied from CRM [id number]. Topic: Clinical - Medication Refill >> Oct 02, 2023  1:47 PM Essie A wrote: Medication: metFORMIN  (GLUCOPHAGE -XR) 500 MG 24 hr tablet - need 1000 mg because he has to take it 2 times a day   Has the patient contacted their pharmacy? Yes (Agent: If no, request that the patient contact the pharmacy for the refill. If patient does not wish to contact the pharmacy document the reason why and proceed with request.) (Agent: If yes, when and what did the pharmacy advise?)   This is the patient's preferred pharmacy:  [facility] - [place], [place] - [address] [place] [address] Phone: [phone] Fax: [phone]   Is this the correct pharmacy for this prescription? Yes If no, delete pharmacy and type the correct one.    Has the prescription been filled recently? Yes   Is the patient out of the medication? No   Has the patient been seen for an appointment in the last year OR does the patient have an upcoming appointment? Yes   Can we respond through MyChart? No   Agent: Please be advised that Rx refills may take up to 3 business days. We ask that you follow-up with your pharmacy. >> Oct 10, 2023 10:35 AM Zebedee SAUNDERS wrote: Pharmacy have not received prescription for medication metFORMIN  (GLUCOPHAGE -XR) 500 MG 24 hr tablet - need 1000 mg because he has to take it 2 times a day.

## 2023-10-25 NOTE — Telephone Encounter (Signed)
 MC results read by patient.

## 2023-11-12 ENCOUNTER — Telehealth (INDEPENDENT_AMBULATORY_CARE_PROVIDER_SITE_OTHER): Payer: Self-pay | Admitting: Primary Care

## 2023-11-12 NOTE — Telephone Encounter (Signed)
 Patient presented stating he needs to take one pill of Metformin . He reported that Vanduser instructed him to take two doses, but he expressed concern about needing a larger quantity of medication. I advised the patient to have a seat while I consult with the provider to clarify his medication needs. I will either relay the information or have the provider speak with him directly for better understanding

## 2023-11-13 NOTE — Telephone Encounter (Signed)
 Please advise and make correction to medication script

## 2023-11-15 ENCOUNTER — Other Ambulatory Visit (INDEPENDENT_AMBULATORY_CARE_PROVIDER_SITE_OTHER): Payer: Self-pay | Admitting: Primary Care

## 2023-11-15 DIAGNOSIS — E1100 Type 2 diabetes mellitus with hyperosmolarity without nonketotic hyperglycemic-hyperosmolar coma (NKHHC): Secondary | ICD-10-CM

## 2023-11-15 MED ORDER — GLIPIZIDE 10 MG PO TABS: 10.0000 mg | ORAL_TABLET | Freq: Two times a day (BID) | ORAL | 1 refills | Status: AC

## 2023-11-16 NOTE — Telephone Encounter (Addendum)
 He stated that he needed the medication I told him due me waiting for the provider response if he can't wait anymore to go to [facility] or ED refill

## 2023-11-16 NOTE — Telephone Encounter (Signed)
 Called patient and he stated that he is taking his glipizide  twice daily and metformin  twice     How should he be taking medication ?   Interpreter id # [place] [phone]

## 2023-11-19 NOTE — Telephone Encounter (Signed)
 Called patient and he is aware appointment made

## 2023-11-22 ENCOUNTER — Encounter (INDEPENDENT_AMBULATORY_CARE_PROVIDER_SITE_OTHER): Payer: Self-pay | Admitting: Primary Care

## 2023-11-22 ENCOUNTER — Ambulatory Visit (INDEPENDENT_AMBULATORY_CARE_PROVIDER_SITE_OTHER): Admitting: Primary Care

## 2023-11-22 VITALS — BP 142/70 | HR 74 | Resp 19 | Ht 67.0 in | Wt 189.4 lb

## 2023-11-22 DIAGNOSIS — H539 Unspecified visual disturbance: Secondary | ICD-10-CM

## 2023-11-22 DIAGNOSIS — I10 Essential (primary) hypertension: Secondary | ICD-10-CM | POA: Diagnosis not present

## 2023-11-22 DIAGNOSIS — E1100 Type 2 diabetes mellitus with hyperosmolarity without nonketotic hyperglycemic-hyperosmolar coma (NKHHC): Secondary | ICD-10-CM | POA: Diagnosis not present

## 2023-11-22 DIAGNOSIS — R600 Localized edema: Secondary | ICD-10-CM | POA: Diagnosis not present

## 2023-11-22 LAB — POCT GLYCOSYLATED HEMOGLOBIN (HGB A1C): Hemoglobin A1C: 7.4 % — AB (ref 4.0–5.6)

## 2023-11-22 MED ORDER — METFORMIN HCL ER 500 MG PO TB24
500.0000 mg | ORAL_TABLET | Freq: Two times a day (BID) | ORAL | 1 refills | Status: AC
Start: 2023-11-22 — End: ?

## 2023-11-22 NOTE — Progress Notes (Signed)
 Metformin  need direction to be changed for future refills. Patient is taking 2 tablets.

## 2023-11-22 NOTE — Progress Notes (Signed)
 Subjective:  Patient ID: [name], male    DOB: 1960-09-03  Age: 63 y.o. MRN: [id number]  CC: Diabetes   [name] is a 63 year old Hispanic male (interpreter Mariel) presents for Follow-up of diabetes. Patient does check blood sugar at home   Compliant with meds - Yes Checking CBGs? Yes  Fasting avg - 102-168  Postprandial average -  Exercising regularly? - Yes Watching carbohydrate intake? - Yes Neuropathy ? - No Hypoglycemic events - Yes- discuss signs and symptoms of hypoglycemia - what action to take hard candy, soda, orange juice with sugar   - Recovers with : peanut butter crackers   Pertinent ROS:  Polyuria - No Polydipsia - No Vision problems - Yes  Medications as noted below. Taking them regularly without complication/adverse reaction being reported today.   History [name] has a past medical history of CAD (coronary artery disease), native coronary artery (09/20/2017), Diabetes mellitus without complication (HCC), Hyperlipidemia, and Hypertension.   [name] has a past surgical history that includes LEFT HEART CATH AND CORONARY ANGIOGRAPHY (N/A, 07/22/2017); Coronary artery bypass graft (N/A, 07/23/2017); and TEE without cardioversion (N/A, 07/23/2017).   His family history includes Heart disease in his mother.[name] reports that [name] quit smoking about 25 years ago. His smoking use included cigarettes. [name] has never used smokeless tobacco. [name] reports current alcohol use. [name] reports that [name] does not use drugs.  Current Outpatient Medications on File Prior to Visit  Medication Sig Dispense Refill   acetaminophen  (TYLENOL ) 325 MG tablet Take 650 mg by mouth every 6 (six) hours as needed.     atorvastatin  (LIPITOR ) 80 MG tablet Take 1 tablet (80 mg total) by mouth daily at 6 PM. 90 tablet 3   ezetimibe  (ZETIA ) 10 MG tablet Take 1 tablet (10 mg total) by mouth daily. 90 tablet 3   glipiZIDE  (GLUCOTROL ) 10 MG tablet Take 1 tablet (10 mg total) by mouth 2 (two) times daily.  180 tablet 1   glucose blood (CONTOUR NEXT TEST) test strip Use to check blood sugar once daily. 100 each 2   hydrochlorothiazide  (HYDRODIURIL ) 25 MG tablet Take 1 tablet (25 mg total) by mouth daily. 90 tablet 3   losartan  (COZAAR ) 100 MG tablet Take 1 tablet (100 mg total) by mouth daily. 90 tablet 3   metoprolol  tartrate (LOPRESSOR ) 50 MG tablet Take 1 tablet (50 mg total) by mouth 2 (two) times daily. 180 tablet 3   Microlet Lancets MISC Use to check blood sugar once daily. 100 each 3   potassium chloride  (KLOR-CON ) 10 MEQ tablet Take 1 tablet (10 mEq total) by mouth daily. 90 tablet 3   No current facility-administered medications on file prior to visit.    Review of Systems Comprehensive ROS Pertinent positive and negative noted in HPI   Objective:  BP (!) 142/70 (Cuff Size: Large)   Pulse 74   Resp 19   Ht 5' 7 (1.702 m)   Wt 189 lb 6.4 oz (85.9 kg)   SpO2 100%   BMI 29.66 kg/m   BP Readings from Last 3 Encounters:  11/22/23 (!) 142/70  10/02/23 (!) 110/51  07/25/23 133/80    Wt Readings from Last 3 Encounters:  11/22/23 189 lb 6.4 oz (85.9 kg)  07/25/23 187 lb 12.8 oz (85.2 kg)  07/05/23 189 lb (85.7 kg)    Physical Exam  Lab Results  Component Value Date   HGBA1C 7.4 (A) 11/22/2023   HGBA1C 7.6 (H) 07/25/2023   HGBA1C 6.2 01/24/2023  Lab Results  Component Value Date   WBC 7.6 07/25/2023   HGB 13.9 07/25/2023   HCT 42.0 07/25/2023   PLT 169 07/25/2023   GLUCOSE 148 (H) 07/25/2023   CHOL 97 (L) 07/25/2023   TRIG 89 07/25/2023   HDL 34 (L) 07/25/2023   LDLCALC 45 07/25/2023   ALT 22 07/25/2023   AST 18 07/25/2023   NA 139 07/25/2023   K 4.3 07/25/2023   CL 102 07/25/2023   CREATININE 1.06 07/25/2023   BUN 14 07/25/2023   CO2 25 07/25/2023   TSH 5.572 (H) 07/29/2017   INR 1.46 07/23/2017   HGBA1C 7.4 (A) 11/22/2023    Title   Diabetic Foot Exam - detailed Date & Time: 11/22/2023  9:40 AM Diabetic Foot exam was performed with the following  findings: Yes  Visual Foot Exam completed.: Yes  Is there a history of foot ulcer?: No Is there a foot ulcer now?: No Is there swelling?: No Is there elevated skin temperature?: No Is there abnormal foot shape?: No Is there a claw toe deformity?: No Are the toenails long?: No Are the toenails thick?: No Are the toenails ingrown?: No Is the skin thin, fragile, shiny and hairless?: No Normal Range of Motion?: Yes Is there foot or ankle muscle weakness?: Yes Do you have pain in calf while walking?: Yes Are the shoes appropriate in style and fit?: Yes Can the patient see the bottom of their feet?: Yes Pulse Foot Exam completed.: Yes   Right Dorsalis Pedis: Present Left Dorsalis Pedis: Present     Sensory Foot Exam Completed.: Yes Semmes-Weinstein Monofilament Test + means has sensation and - means no sensation  R Foot Test Control: Pos L Foot Test Control: Pos   R Site 1-Great Toe: Pos L Site 1-Great Toe: Pos   R Site 4: Pos L Site 4: Pos   R site 5: Pos L Site 5: Pos  R Site 6: Pos L Site 6: Pos     Image components are not supported.   Image components are not supported. Image components are not supported.  [place] Right vibratory: present Left vibratory: present  Comments Left foot edema when at work wears diabetic socks and no swelling       Assessment & Plan:   [name] was seen today for diabetes.  Diagnoses and all orders for this visit:  Type 2 diabetes mellitus with hyperosmolarity without coma, without long-term current use of insulin  (HCC)  monitor carbohydrates -rice, potatoes, tortillas, [place] Corn [employer] Flour, breads, pasta, sweets, sodas.  Increase exercising to help maintain appropriate weight.  -     POCT glycosylated hemoglobin (Hb A1C)7.4 -     Ambulatory referral to Ophthalmology -     metFORMIN  (GLUCOPHAGE -XR) 500 MG 24 hr tablet; Take 1 tablet (500 mg total) by mouth 2 (two) times daily with a meal.  Changes in vision -      Ambulatory referral to Ophthalmology  Essential hypertension 153/82 systolic elevated reck 142/70  Managed by cardiology   Edema of left foot 1+ continue to wear diabetic socks and elevate feet when able      Follow-up:  Return in about 3 months (around 02/22/2024) for DM.  The above assessment and management plan was discussed with the patient. The patient verbalized understanding of and has agreed to the management plan. Patient is aware to call the clinic if symptoms fail to improve or worsen. Patient is aware when to return to the clinic for a  follow-up visit. Patient educated on when it is appropriate to go to the emergency department.   [name], NP-C

## 2024-01-24 ENCOUNTER — Ambulatory Visit (INDEPENDENT_AMBULATORY_CARE_PROVIDER_SITE_OTHER): Admitting: Primary Care

## 2024-01-29 ENCOUNTER — Ambulatory Visit (INDEPENDENT_AMBULATORY_CARE_PROVIDER_SITE_OTHER)

## 2024-01-29 DIAGNOSIS — Z23 Encounter for immunization: Secondary | ICD-10-CM

## 2024-01-29 NOTE — Progress Notes (Signed)
 Pt came into the office for  flu vaccine

## 2024-02-18 ENCOUNTER — Ambulatory Visit (INDEPENDENT_AMBULATORY_CARE_PROVIDER_SITE_OTHER): Admitting: Primary Care

## 2024-02-18 ENCOUNTER — Encounter (INDEPENDENT_AMBULATORY_CARE_PROVIDER_SITE_OTHER): Payer: Self-pay

## 2024-02-18 ENCOUNTER — Other Ambulatory Visit (INDEPENDENT_AMBULATORY_CARE_PROVIDER_SITE_OTHER): Payer: Self-pay | Admitting: Primary Care

## 2024-02-18 DIAGNOSIS — E1100 Type 2 diabetes mellitus with hyperosmolarity without nonketotic hyperglycemic-hyperosmolar coma (NKHHC): Secondary | ICD-10-CM

## 2024-02-21 NOTE — Telephone Encounter (Signed)
 Rx 11/22/23 #180 1RF- too soon Requested Prescriptions  Pending Prescriptions Disp Refills   metFORMIN  (GLUCOPHAGE -XR) 500 MG 24 hr tablet [Pharmacy Med Name: METFORMIN  HCL ER 500 MG ORAL TABLET EXTENDED RELEASE 24 HOUR] 180 tablet 1    Sig: TAKE 1 TABLET (500 MG TOTAL) BY MOUTH 2 (TWO) TIMES DAILY WITH A MEAL.     Endocrinology:  Diabetes - Biguanides Failed - 02/21/2024 12:26 PM      Failed - B12 Level in normal range and within 720 days    No results found for: VITAMINB12       Passed - Cr in normal range and within 360 days    Creatinine, Ser  Date Value Ref Range Status  07/25/2023 1.06 0.76 - 1.27 mg/dL Final         Passed - HBA1C is between 0 and 7.9 and within 180 days    Hemoglobin A1C  Date Value Ref Range Status  11/22/2023 7.4 (A) 4.0 - 5.6 % Final   HbA1c, POC (controlled diabetic range)  Date Value Ref Range Status  01/24/2023 6.2 0.0 - 7.0 % Final   Hgb A1c MFr Bld  Date Value Ref Range Status  07/25/2023 7.6 (H) 4.8 - 5.6 % Final    Comment:             Prediabetes: 5.7 - 6.4          Diabetes: >6.4          Glycemic control for adults with diabetes: <7.0          Passed - eGFR in normal range and within 360 days    GFR calc Af Amer  Date Value Ref Range Status  02/12/2020 99 >59 mL/min/1.73 Final    Comment:    **In accordance with recommendations from the NKF-ASN Task force,**   [facility] is in the process of updating its eGFR calculation to the   2021 CKD-EPI creatinine equation that estimates kidney function   without a race variable.    GFR calc non Af Amer  Date Value Ref Range Status  02/12/2020 85 >59 mL/min/1.73 Final   eGFR  Date Value Ref Range Status  07/25/2023 80 >59 mL/min/1.73 Final         Passed - Valid encounter within last 6 months    Recent Outpatient Visits           3 months ago Type 2 diabetes mellitus with hyperosmolarity without coma, without long-term current use of insulin  (HCC)   [place] [facility] Celestia Rosaline SQUIBB, NP   7 months ago Type 2 diabetes mellitus with hyperosmolarity without coma, without long-term current use of insulin  (HCC)   [place] [facility] Celestia Rosaline SQUIBB, NP   1 year ago Type 2 diabetes mellitus with hyperosmolarity without coma, without long-term current use of insulin  (HCC)   [place] [facility] Celestia Rosaline SQUIBB, NP   1 year ago Type 2 diabetes mellitus with hyperosmolarity without coma, without long-term current use of insulin  [facility])   [place] [facility] Celestia Rosaline SQUIBB, NP   1 year ago Type 2 diabetes mellitus with hyperosmolarity without coma, without long-term current use of insulin  (HCC)   [facility] Dept Of [place]. [facility], [place] L, RPH-CPP              Passed - CBC within normal limits and completed in the last 12  months    WBC  Date Value Ref Range Status  07/25/2023 7.6 3.4 - 10.8 x10E3/uL Final  08/06/2017 12.5 (H) 4.0 - 10.5 K/uL Final   RBC  Date Value Ref Range Status  07/25/2023 4.40 4.14 - 5.80 x10E6/uL Final  08/06/2017 3.68 (L) 4.22 - 5.81 MIL/uL Final   Hemoglobin  Date Value Ref Range Status  07/25/2023 13.9 13.0 - 17.7 g/dL Final   Hematocrit  Date Value Ref Range Status  07/25/2023 42.0 37.5 - 51.0 % Final   MCHC  Date Value Ref Range Status  07/25/2023 33.1 31.5 - 35.7 g/dL Final  94/93/7980 66.2 30.0 - 36.0 g/dL Final   [facility]  Date Value Ref Range Status  07/25/2023 31.6 26.6 - 33.0 pg Final  08/06/2017 31.3 26.0 - 34.0 pg Final   MCV  Date Value Ref Range Status  07/25/2023 96 79 - 97 fL Final   No results found for: PLTCOUNTKUC, LABPLAT, POCPLA RDW  Date Value Ref Range Status  07/25/2023 12.4 11.6 - 15.4 % Final

## 2024-02-22 ENCOUNTER — Ambulatory Visit (INDEPENDENT_AMBULATORY_CARE_PROVIDER_SITE_OTHER): Admitting: Primary Care

## 2024-02-22 ENCOUNTER — Encounter (INDEPENDENT_AMBULATORY_CARE_PROVIDER_SITE_OTHER): Payer: Self-pay | Admitting: Primary Care

## 2024-02-22 VITALS — BP 132/79 | HR 76 | Resp 16 | Wt 192.4 lb

## 2024-02-22 DIAGNOSIS — E1100 Type 2 diabetes mellitus with hyperosmolarity without nonketotic hyperglycemic-hyperosmolar coma (NKHHC): Secondary | ICD-10-CM

## 2024-02-22 DIAGNOSIS — Z7984 Long term (current) use of oral hypoglycemic drugs: Secondary | ICD-10-CM | POA: Diagnosis not present

## 2024-02-22 DIAGNOSIS — E785 Hyperlipidemia, unspecified: Secondary | ICD-10-CM | POA: Diagnosis not present

## 2024-02-22 DIAGNOSIS — I10 Essential (primary) hypertension: Secondary | ICD-10-CM

## 2024-02-22 LAB — POCT GLYCOSYLATED HEMOGLOBIN (HGB A1C): HbA1c, POC (controlled diabetic range): 7.6 % — AB (ref 0.0–7.0)

## 2024-02-22 NOTE — Patient Instructions (Addendum)
 Eye doctor  [facility] [phone] [place] [facility] [place] [facility] [phone] [place]

## 2024-02-22 NOTE — Progress Notes (Signed)
 Subjective:  Patient ID: [name], male    DOB: 11/04/1960  Age: 63 y.o. MRN: [id number]  CC: Diabetes   [name] is a obese Hispanic male ( interpreter [place]  [phone]) presents forFollow-up of diabetes. Patient does check blood sugar at home  Compliant with meds - Yes Checking CBGs? Yes  Fasting avg - 130-180  Postprandial average -  Exercising regularly? - Yes Watching carbohydrate intake? - Yes Neuropathy ? - No Hypoglycemic events - No  - Recovers with :   Pertinent ROS:  Polyuria - No Polydipsia - No Vision problems - No  Medications as noted below. Taking them regularly without complication/adverse reaction being reported today.   History [name] has a past medical history of CAD (coronary artery disease), native coronary artery (09/20/2017), Diabetes mellitus without complication (HCC), Hyperlipidemia, and Hypertension.   He has a past surgical history that includes LEFT HEART CATH AND CORONARY ANGIOGRAPHY (N/A, 07/22/2017); Coronary artery bypass graft (N/A, 07/23/2017); and TEE without cardioversion (N/A, 07/23/2017).   His family history includes Heart disease in his mother.He reports that he quit smoking about 25 years ago. His smoking use included cigarettes. He has never used smokeless tobacco. He reports current alcohol use. He reports that he does not use drugs.  Current Outpatient Medications on File Prior to Visit  Medication Sig Dispense Refill   acetaminophen  (TYLENOL ) 325 MG tablet Take 650 mg by mouth every 6 (six) hours as needed.     atorvastatin  (LIPITOR ) 80 MG tablet Take 1 tablet (80 mg total) by mouth daily at 6 PM. 90 tablet 3   ezetimibe  (ZETIA ) 10 MG tablet Take 1 tablet (10 mg total) by mouth daily. 90 tablet 3   glipiZIDE  (GLUCOTROL ) 10 MG tablet Take 1 tablet (10 mg total) by mouth 2 (two) times daily. 180 tablet 1   glucose blood (CONTOUR NEXT TEST) test strip Use to check blood sugar once daily. 100 each 2   hydrochlorothiazide   (HYDRODIURIL ) 25 MG tablet Take 1 tablet (25 mg total) by mouth daily. 90 tablet 3   losartan  (COZAAR ) 100 MG tablet Take 1 tablet (100 mg total) by mouth daily. 90 tablet 3   metFORMIN  (GLUCOPHAGE -XR) 500 MG 24 hr tablet Take 1 tablet (500 mg total) by mouth 2 (two) times daily with a meal. 180 tablet 1   metoprolol  tartrate (LOPRESSOR ) 50 MG tablet Take 1 tablet (50 mg total) by mouth 2 (two) times daily. 180 tablet 3   Microlet Lancets MISC Use to check blood sugar once daily. 100 each 3   potassium chloride  (KLOR-CON ) 10 MEQ tablet Take 1 tablet (10 mEq total) by mouth daily. 90 tablet 3   No current facility-administered medications on file prior to visit.    Review of Systems Comprehensive ROS Pertinent positive and negative noted in HPI   Objective:  BP 132/79   Pulse 76   Resp 16   Wt 192 lb 6.4 oz (87.3 kg)   SpO2 98%   BMI 30.13 kg/m   BP Readings from Last 3 Encounters:  02/22/24 132/79  11/22/23 (!) 142/70  10/02/23 (!) 110/51    Wt Readings from Last 3 Encounters:  02/22/24 192 lb 6.4 oz (87.3 kg)  11/22/23 189 lb 6.4 oz (85.9 kg)  07/25/23 187 lb 12.8 oz (85.2 kg)    Physical Exam Vitals reviewed.  Constitutional:      Appearance: He is obese.  HENT:     Head: Normocephalic.     Right Ear: Tympanic  membrane and external ear normal.     Left Ear: Tympanic membrane and external ear normal.     Nose: Nose normal.  Eyes:     Extraocular Movements: Extraocular movements intact.     Pupils: Pupils are equal, round, and reactive to light.  Cardiovascular:     Rate and Rhythm: Normal rate and regular rhythm.  Pulmonary:     Effort: Pulmonary effort is normal.     Breath sounds: Normal breath sounds.  Abdominal:     General: Bowel sounds are normal. There is distension.     Palpations: Abdomen is soft.  Musculoskeletal:        General: Normal range of motion.     Cervical back: Normal range of motion and neck supple.  Skin:    General: Skin is warm and  dry.  Neurological:     Mental Status: He is alert and oriented to person, place, and time.  Psychiatric:        Mood and Affect: Mood normal.        Behavior: Behavior normal.        Thought Content: Thought content normal.        Judgment: Judgment normal.     Lab Results  Component Value Date   HGBA1C 7.6 (A) 02/22/2024   HGBA1C 7.4 (A) 11/22/2023   HGBA1C 7.6 (H) 07/25/2023    Lab Results  Component Value Date   WBC 7.6 07/25/2023   HGB 13.9 07/25/2023   HCT 42.0 07/25/2023   PLT 169 07/25/2023   GLUCOSE 148 (H) 07/25/2023   CHOL 97 (L) 07/25/2023   TRIG 89 07/25/2023   HDL 34 (L) 07/25/2023   LDLCALC 45 07/25/2023   ALT 22 07/25/2023   AST 18 07/25/2023   NA 139 07/25/2023   K 4.3 07/25/2023   CL 102 07/25/2023   CREATININE 1.06 07/25/2023   BUN 14 07/25/2023   CO2 25 07/25/2023   TSH 5.572 (H) 07/29/2017   INR 1.46 07/23/2017   HGBA1C 7.6 (A) 02/22/2024    Title   Diabetic Foot Exam - detailed    Semmes-Weinstein Monofilament Test + means has sensation and - means no sensation      Image components are not supported.   Image components are not supported. Image components are not supported.  [place] Comments      Assessment & Plan:   Erich was seen today for diabetes.  Diagnoses and all orders for this visit:   Type 2 diabetes mellitus with hyperosmolarity without coma, without long-term current use of insulin  (HCC) monitor carbohydrates -rice, potatoes, tortillas, [place] Corn Masa Flour, breads, pasta, sweets, sodas.  Increase exercising to help maintain appropriate weight.  -     [id number]+EGFRl -     POCT glycosylated hemoglobin (Hb A1C)   Follow-up:    The above assessment and management plan was discussed with the patient. The patient verbalized understanding of and has agreed to the management plan. Patient is aware to call the clinic if symptoms fail to improve or worsen. Patient is aware when to return to the clinic for a  follow-up visit. Patient educated on when it is appropriate to go to the emergency department.   [name], NP-C

## 2024-02-23 LAB — CMP14+EGFR
ALT: 21 IU/L (ref 0–44)
AST: 16 IU/L (ref 0–40)
Albumin: 4.3 g/dL (ref 3.9–4.9)
Alkaline Phosphatase: 94 IU/L (ref 47–123)
BUN/Creatinine Ratio: 14 (ref 10–24)
BUN: 13 mg/dL (ref 8–27)
Bilirubin Total: 0.9 mg/dL (ref 0.0–1.2)
CO2: 22 mmol/L (ref 20–29)
Calcium: 9.4 mg/dL (ref 8.6–10.2)
Chloride: 99 mmol/L (ref 96–106)
Creatinine, Ser: 0.94 mg/dL (ref 0.76–1.27)
Globulin, Total: 2.5 g/dL (ref 1.5–4.5)
Glucose: 135 mg/dL — ABNORMAL HIGH (ref 70–99)
Potassium: 3.8 mmol/L (ref 3.5–5.2)
Sodium: 137 mmol/L (ref 134–144)
Total Protein: 6.8 g/dL (ref 6.0–8.5)
eGFR: 92 mL/min/1.73 (ref >59)

## 2024-02-23 LAB — CBC WITH DIFFERENTIAL/PLATELET
Basophils Absolute: 0.1 x10E3/uL (ref 0.0–0.2)
Basos: 1 %
EOS (ABSOLUTE): 0.2 x10E3/uL (ref 0.0–0.4)
Eos: 3 %
Hematocrit: 41.1 % (ref 37.5–51.0)
Hemoglobin: 14.1 g/dL (ref 13.0–17.7)
Immature Grans (Abs): 0 x10E3/uL (ref 0.0–0.1)
Immature Granulocytes: 0 %
Lymphocytes Absolute: 2.3 x10E3/uL (ref 0.7–3.1)
Lymphs: 31 %
MCH: 32.9 pg (ref 26.6–33.0)
MCHC: 34.3 g/dL (ref 31.5–35.7)
MCV: 96 fL (ref 79–97)
Monocytes Absolute: 0.5 x10E3/uL (ref 0.1–0.9)
Monocytes: 7 %
Neutrophils Absolute: 4.2 x10E3/uL (ref 1.4–7.0)
Neutrophils: 58 %
Platelets: 157 x10E3/uL (ref 150–450)
RBC: 4.29 x10E6/uL (ref 4.14–5.80)
RDW: 12.5 % (ref 11.6–15.4)
WBC: 7.2 x10E3/uL (ref 3.4–10.8)

## 2024-02-23 LAB — LIPID PANEL
Chol/HDL Ratio: 2.6 ratio (ref 0.0–5.0)
Cholesterol, Total: 87 mg/dL — ABNORMAL LOW (ref 100–199)
HDL: 34 mg/dL — ABNORMAL LOW (ref >39)
LDL Chol Calc (NIH): 34 mg/dL (ref 0–99)
Triglycerides: 99 mg/dL (ref 0–149)
VLDL Cholesterol Cal: 19 mg/dL (ref 5–40)

## 2024-02-25 ENCOUNTER — Ambulatory Visit (INDEPENDENT_AMBULATORY_CARE_PROVIDER_SITE_OTHER): Payer: Self-pay | Admitting: Primary Care

## 2024-02-25 ENCOUNTER — Ambulatory Visit (INDEPENDENT_AMBULATORY_CARE_PROVIDER_SITE_OTHER): Admitting: Primary Care

## 2024-04-18 ENCOUNTER — Other Ambulatory Visit: Payer: Self-pay | Admitting: Cardiology

## 2024-04-18 DIAGNOSIS — E785 Hyperlipidemia, unspecified: Secondary | ICD-10-CM

## 2024-05-06 ENCOUNTER — Other Ambulatory Visit: Payer: Self-pay | Admitting: Cardiology

## 2024-05-06 DIAGNOSIS — I2510 Atherosclerotic heart disease of native coronary artery without angina pectoris: Secondary | ICD-10-CM

## 2024-05-27 ENCOUNTER — Ambulatory Visit (INDEPENDENT_AMBULATORY_CARE_PROVIDER_SITE_OTHER): Admitting: Primary Care

## 2024-08-19 ENCOUNTER — Ambulatory Visit: Admitting: Cardiology
